# Patient Record
Sex: Female | Born: 1960 | Race: White | Hispanic: No | Marital: Single | Smoking: Never smoker
Health system: Southern US, Community
[De-identification: ages and names within clinical notes are randomized; demographics above are authoritative.]

## PROBLEM LIST (undated history)

## (undated) DIAGNOSIS — I10 Essential (primary) hypertension: Secondary | ICD-10-CM

## (undated) DIAGNOSIS — M1990 Unspecified osteoarthritis, unspecified site: Secondary | ICD-10-CM

## (undated) DIAGNOSIS — Z87442 Personal history of urinary calculi: Secondary | ICD-10-CM

## (undated) DIAGNOSIS — D649 Anemia, unspecified: Secondary | ICD-10-CM

## (undated) DIAGNOSIS — R319 Hematuria, unspecified: Secondary | ICD-10-CM

## (undated) DIAGNOSIS — E119 Type 2 diabetes mellitus without complications: Secondary | ICD-10-CM

## (undated) DIAGNOSIS — K219 Gastro-esophageal reflux disease without esophagitis: Secondary | ICD-10-CM

## (undated) DIAGNOSIS — Z8489 Family history of other specified conditions: Secondary | ICD-10-CM

## (undated) DIAGNOSIS — E785 Hyperlipidemia, unspecified: Secondary | ICD-10-CM

## (undated) HISTORY — DX: Essential (primary) hypertension: I10

## (undated) HISTORY — DX: Hematuria, unspecified: R31.9

## (undated) HISTORY — DX: Hyperlipidemia, unspecified: E78.5

## (undated) HISTORY — DX: Unspecified osteoarthritis, unspecified site: M19.90

## (undated) HISTORY — PX: TONSILLECTOMY: SUR1361

## (undated) HISTORY — DX: Type 2 diabetes mellitus without complications: E11.9

---

## 2005-03-13 HISTORY — PX: KIDNEY STONE SURGERY: SHX686

## 2011-02-11 LAB — HM COLONOSCOPY: HM COLON: NORMAL

## 2011-02-19 ENCOUNTER — Ambulatory Visit: Payer: Self-pay | Admitting: Internal Medicine

## 2011-05-13 ENCOUNTER — Ambulatory Visit: Payer: Self-pay | Admitting: Internal Medicine

## 2011-05-26 ENCOUNTER — Ambulatory Visit: Payer: Self-pay | Admitting: Internal Medicine

## 2011-05-28 ENCOUNTER — Ambulatory Visit: Payer: Self-pay | Admitting: Internal Medicine

## 2011-05-29 DIAGNOSIS — Z9889 Other specified postprocedural states: Secondary | ICD-10-CM | POA: Insufficient documentation

## 2011-06-11 ENCOUNTER — Ambulatory Visit: Payer: Self-pay | Admitting: Internal Medicine

## 2011-07-12 ENCOUNTER — Ambulatory Visit: Payer: Self-pay | Admitting: Internal Medicine

## 2011-08-11 HISTORY — PX: PARATHYROIDECTOMY: SHX19

## 2011-09-12 DIAGNOSIS — R748 Abnormal levels of other serum enzymes: Secondary | ICD-10-CM | POA: Insufficient documentation

## 2012-01-11 LAB — HM DIABETES EYE EXAM

## 2012-03-04 ENCOUNTER — Ambulatory Visit: Payer: Self-pay | Admitting: Internal Medicine

## 2013-05-18 LAB — HM PAP SMEAR: HM PAP: NEGATIVE

## 2013-05-18 LAB — LIPID PANEL
Cholesterol: 188 mg/dL (ref 0–200)
HDL: 66 mg/dL (ref 35–70)
LDL Cholesterol: 96 mg/dL
Triglycerides: 129 mg/dL (ref 40–160)

## 2013-06-14 LAB — HM MAMMOGRAPHY: HM MAMMO: NORMAL

## 2013-06-21 ENCOUNTER — Ambulatory Visit: Payer: Self-pay | Admitting: Internal Medicine

## 2013-11-08 LAB — HEMOGLOBIN A1C: Hgb A1c MFr Bld: 6.7 % — AB (ref 4.0–6.0)

## 2014-06-06 ENCOUNTER — Encounter: Payer: Self-pay | Admitting: Internal Medicine

## 2014-06-06 DIAGNOSIS — R809 Proteinuria, unspecified: Secondary | ICD-10-CM

## 2014-06-06 DIAGNOSIS — E785 Hyperlipidemia, unspecified: Secondary | ICD-10-CM | POA: Insufficient documentation

## 2014-06-06 DIAGNOSIS — I10 Essential (primary) hypertension: Secondary | ICD-10-CM | POA: Insufficient documentation

## 2014-06-06 DIAGNOSIS — E1169 Type 2 diabetes mellitus with other specified complication: Secondary | ICD-10-CM | POA: Insufficient documentation

## 2014-06-06 DIAGNOSIS — E1129 Type 2 diabetes mellitus with other diabetic kidney complication: Secondary | ICD-10-CM | POA: Insufficient documentation

## 2014-06-06 DIAGNOSIS — E118 Type 2 diabetes mellitus with unspecified complications: Secondary | ICD-10-CM | POA: Insufficient documentation

## 2014-08-15 ENCOUNTER — Ambulatory Visit (INDEPENDENT_AMBULATORY_CARE_PROVIDER_SITE_OTHER): Payer: BC Managed Care – PPO | Admitting: Internal Medicine

## 2014-08-15 ENCOUNTER — Encounter: Payer: Self-pay | Admitting: Internal Medicine

## 2014-08-15 VITALS — BP 110/62 | HR 72 | Ht 60.0 in | Wt 212.2 lb

## 2014-08-15 DIAGNOSIS — E119 Type 2 diabetes mellitus without complications: Secondary | ICD-10-CM

## 2014-08-15 DIAGNOSIS — Z1239 Encounter for other screening for malignant neoplasm of breast: Secondary | ICD-10-CM | POA: Diagnosis not present

## 2014-08-15 DIAGNOSIS — E782 Mixed hyperlipidemia: Secondary | ICD-10-CM

## 2014-08-15 DIAGNOSIS — Z0000 Encounter for general adult medical examination without abnormal findings: Secondary | ICD-10-CM | POA: Diagnosis not present

## 2014-08-15 DIAGNOSIS — M62830 Muscle spasm of back: Secondary | ICD-10-CM | POA: Diagnosis not present

## 2014-08-15 DIAGNOSIS — I10 Essential (primary) hypertension: Secondary | ICD-10-CM

## 2014-08-15 DIAGNOSIS — E1129 Type 2 diabetes mellitus with other diabetic kidney complication: Secondary | ICD-10-CM

## 2014-08-15 DIAGNOSIS — R809 Proteinuria, unspecified: Secondary | ICD-10-CM

## 2014-08-15 LAB — POC URINALYSIS WITH MICROSCOPIC (NON AUTO)MANUAL RESULT
Bacteria, UA: 0
Bilirubin, UA: NEGATIVE
Crystals: 0
Epithelial cells, urine per micros: 0
Glucose, UA: NEGATIVE
KETONES UA: NEGATIVE
MUCUS UA: 0
NITRITE UA: NEGATIVE
PH UA: 7
PROTEIN UA: NEGATIVE
RBC: 2 M/uL — AB (ref 4.04–5.48)
Spec Grav, UA: 1.01
UROBILINOGEN UA: 0.2
WBC CASTS UA: 2

## 2014-08-15 MED ORDER — METHOCARBAMOL 500 MG PO TABS: 500.0000 mg | ORAL_TABLET | Freq: Four times a day (QID) | ORAL | Status: DC

## 2014-08-15 MED ORDER — GLUCOSE BLOOD VI STRP: ORAL_STRIP | Status: DC

## 2014-08-15 NOTE — Progress Notes (Signed)
Date:  08/15/2014   Name:  Brittany Forbes   DOB:  November 23, 1960   MRN:  [id number]   Chief Complaint: Annual Exam; Diabetes; Hypertension; and Hyperlipidemia Brittany Forbes is a 54 y.o. female who presents today for her Complete Annual Exam. She feels fairly well. She reports exercising 3-4 times per week. She reports she is sleeping well. She performs a self breast exam intermittently.  Diabetes She presents for her follow-up diabetic visit. She has type 2 diabetes mellitus. Her disease course has been worsening. Pertinent negatives for hypoglycemia include no headaches. Pertinent negatives for diabetes include no chest pain, no polydipsia and no polyuria. Current diabetic treatment includes oral agent (monotherapy). She is compliant with treatment all of the time. Her breakfast blood glucose is taken between 6-7 am. Her breakfast blood glucose range is generally 110-130 mg/dl. An ACE inhibitor/angiotensin II receptor blocker is being taken. Eye exam is current.  Hypertension This is a chronic problem. The current episode started more than 1 year ago. The problem is unchanged. The problem is controlled. Pertinent negatives include no chest pain, headaches, palpitations, peripheral edema or shortness of breath. Risk factors for coronary artery disease include diabetes mellitus and dyslipidemia. Past treatments include angiotensin blockers. The current treatment provides significant improvement. There are no compliance problems.   Hyperlipidemia This is a chronic problem. The problem is controlled. Exacerbating diseases include diabetes. Pertinent negatives include no chest pain, myalgias or shortness of breath. Current antihyperlipidemic treatment includes statins. The current treatment provides significant improvement of lipids. There are no compliance problems.   Back Pain This is a new problem. The current episode started 1 to 4 weeks ago. The problem occurs intermittently. The pain is present in the  lumbar spine. The quality of the pain is described as aching and cramping. The pain does not radiate. The symptoms are aggravated by twisting and bending. Pertinent negatives include no abdominal pain, chest pain, dysuria, fever or headaches. Risk factors: worse since moving house. She has tried heat and NSAIDs for the symptoms. The treatment provided mild relief.     Review of Systems:  Review of Systems  Constitutional: Negative for fever, appetite change and unexpected weight change.  HENT: Negative for hearing loss, sore throat and trouble swallowing.   Eyes: Negative for visual disturbance.  Respiratory: Negative for cough, chest tightness and shortness of breath.   Cardiovascular: Negative for chest pain, palpitations and leg swelling.  Gastrointestinal: Negative for abdominal pain and blood in stool.  Endocrine: Negative for polydipsia and polyuria.  Genitourinary: Negative for dysuria, urgency, frequency and vaginal bleeding.  Musculoskeletal: Positive for back pain. Negative for myalgias, joint swelling, arthralgias and gait problem.  Neurological: Negative for headaches.  Psychiatric/Behavioral: Negative for sleep disturbance and dysphoric mood.    Patient Active Problem List   Diagnosis Date Noted  . Well controlled diabetes mellitus 06/06/2014  . Essential (primary) hypertension 06/06/2014  . Combined fat and carbohydrate induced hyperlipemia 06/06/2014  . Persistent proteinuria associated with type 2 diabetes mellitus 06/06/2014  . Abnormal liver enzymes 09/12/2011  . Parathyroid adenoma 05/29/2011    Prior to Admission medications   Medication Sig Start Date End Date Taking? Authorizing Provider  atorvastatin (LIPITOR) 10 MG tablet Take 1 tablet by mouth daily. 03/31/14  Yes Historical Provider, MD  GLUCOS-MSM-C-MN-GINGER-WILLOW PO Take 1 tablet by mouth daily.   Yes Historical Provider, MD  losartan (COZAAR) 50 MG tablet Take 1 tablet by mouth daily. 06/01/14  Yes  Historical Provider, MD  metFORMIN (GLUCOPHAGE)  500 MG tablet Take 1 tablet by mouth 2 (two) times daily. 03/31/14  Yes Historical Provider, MD  MULTIPLE VITAMIN PO Take 1 tablet by mouth daily.   Yes Historical Provider, MD  [facility] DELICA LANCETS 57S MISC Use 1 each 2 (two) times daily. Extra fine 12/01/11  Yes Historical Provider, MD  glucose blood (ACCU-CHEK COMFORT CURVE) test strip Use 1 each 2 (two) times daily. Use as instructed. 06/19/11   Historical Provider, MD    Allergies  Allergen Reactions  . Ace Inhibitors     Other reaction(s): Cough    Past Surgical History  Procedure Laterality Date  . Parathyroidectomy  [id number]    [facility]  . Kidney stone surgery Left 020107  . Tonsillectomy      History  Substance Use Topics  . Smoking status: Never Smoker   . Smokeless tobacco: Not on file  . Alcohol Use: 0.0 oz/week    0 Standard drinks or equivalent per week     Medication list has been reviewed and updated.  Physical Examination:  Physical Exam  Constitutional: She is oriented to person, place, and time. She appears well-developed and well-nourished.  HENT:  Right Ear: Tympanic membrane normal.  Left Ear: Tympanic membrane normal.  Nose: Nose normal. Right sinus exhibits no maxillary sinus tenderness. Left sinus exhibits no maxillary sinus tenderness.  Mouth/Throat: Uvula is midline.  Eyes: Conjunctivae and lids are normal.  Neck: Normal range of motion. Neck supple. No tracheal tenderness present. Carotid bruit is not present. No thyromegaly present.  Cardiovascular: Normal rate, regular rhythm and normal heart sounds.   Pulmonary/Chest: Breath sounds normal. She has no decreased breath sounds. She has no wheezes.  Abdominal: Soft. Normal appearance and bowel sounds are normal. There is no hepatosplenomegaly. There is no tenderness.  Genitourinary: No breast swelling, tenderness, discharge or bleeding.  Musculoskeletal:       Lumbar back: She exhibits tenderness and  spasm.  Lymphadenopathy:    She has no cervical adenopathy.    She has no axillary adenopathy.  Neurological: She is alert and oriented to person, place, and time. She has normal reflexes. No sensory deficit.  Skin: Skin is warm, dry and intact.  Psychiatric: She has a normal mood and affect. Cognition and memory are normal.   Lab Results  Component Value Date   HGBA1C 6.7* 11/08/2013     BP 110/62 mmHg  Pulse 72  Ht 5' (1.524 m)  Wt 212 lb 3.2 oz (96.253 kg)  BMI 41.44 kg/m2  Assessment and Plan: 1. Annual physical exam UA positive but microscopic negative - no treatment needed - POC urinalysis w microscopic (non auto)  2. Essential (primary) hypertension controlled - CBC with Differential/Platelet - TSH  3. Persistent proteinuria associated with type 2 diabetes mellitus Continue current therapy - Microalbumin / creatinine urine ratio  4. Well controlled diabetes mellitus Continue current therapy - will advise if dose adjustment needed - Comprehensive metabolic panel - Hemoglobin A1c  5. Combined fat and carbohydrate induced hyperlipemia On appropriate statin therapy - Lipid panel  6. Muscle spasm of back Continue heat and advil Add muscle relaxant to use as needed - methocarbamol (ROBAXIN) 500 MG tablet; Take 1 tablet (500 mg total) by mouth 4 (four) times daily.  Dispense: 60 tablet; Refill: 1  7. Breast cancer screening - MM DIGITAL SCREENING BILATERAL; Future   Halina Maidens, MD [facility]  08/15/2014

## 2014-08-15 NOTE — Patient Instructions (Signed)

## 2014-08-16 ENCOUNTER — Ambulatory Visit
Admission: RE | Admit: 2014-08-16 | Discharge: 2014-08-16 | Disposition: A | Discharge status: Home / Self Care | Payer: BC Managed Care – PPO | Source: Ambulatory Visit | Attending: Internal Medicine | Admitting: Internal Medicine

## 2014-08-16 DIAGNOSIS — Z1231 Encounter for screening mammogram for malignant neoplasm of breast: Secondary | ICD-10-CM | POA: Insufficient documentation

## 2014-08-16 DIAGNOSIS — Z1239 Encounter for other screening for malignant neoplasm of breast: Secondary | ICD-10-CM

## 2014-08-16 LAB — CBC WITH DIFFERENTIAL/PLATELET
BASOS ABS: 0 10*3/uL (ref 0.0–0.2)
Basos: 0 %
EOS (ABSOLUTE): 0.2 10*3/uL (ref 0.0–0.4)
Eos: 2 %
Hematocrit: 41 % (ref 34.0–46.6)
Hemoglobin: 13.7 g/dL (ref 11.1–15.9)
IMMATURE GRANS (ABS): 0 10*3/uL (ref 0.0–0.1)
IMMATURE GRANULOCYTES: 0 %
Lymphocytes Absolute: 3 10*3/uL (ref 0.7–3.1)
Lymphs: 38 %
MCH: 27.9 pg (ref 26.6–33.0)
MCHC: 33.4 g/dL (ref 31.5–35.7)
MCV: 84 fL (ref 79–97)
MONOS ABS: 0.4 10*3/uL (ref 0.1–0.9)
Monocytes: 5 %
NEUTROS PCT: 55 %
Neutrophils Absolute: 4.3 10*3/uL (ref 1.4–7.0)
PLATELETS: 331 10*3/uL (ref 150–379)
RBC: 4.91 x10E6/uL (ref 3.77–5.28)
RDW: 14.5 % (ref 12.3–15.4)
WBC: 8 10*3/uL (ref 3.4–10.8)

## 2014-08-16 LAB — LIPID PANEL
Chol/HDL Ratio: 2.7 ratio units (ref 0.0–4.4)
Cholesterol, Total: 173 mg/dL (ref 100–199)
HDL: 63 mg/dL (ref >39)
LDL Calculated: 81 mg/dL (ref 0–99)
TRIGLYCERIDES: 147 mg/dL (ref 0–149)
VLDL Cholesterol Cal: 29 mg/dL (ref 5–40)

## 2014-08-16 LAB — COMPREHENSIVE METABOLIC PANEL
ALK PHOS: 103 IU/L (ref 39–117)
ALT: 26 IU/L (ref 0–32)
AST: 21 IU/L (ref 0–40)
Albumin/Globulin Ratio: 1.8 (ref 1.1–2.5)
Albumin: 4.7 g/dL (ref 3.5–5.5)
BUN/Creatinine Ratio: 23 (ref 9–23)
BUN: 14 mg/dL (ref 6–24)
Bilirubin Total: 0.3 mg/dL (ref 0.0–1.2)
CO2: 22 mmol/L (ref 18–29)
Calcium: 10 mg/dL (ref 8.7–10.2)
Chloride: 98 mmol/L (ref 97–108)
Creatinine, Ser: 0.62 mg/dL (ref 0.57–1.00)
GFR calc Af Amer: 117 mL/min/{1.73_m2} (ref >59)
GFR, EST NON AFRICAN AMERICAN: 101 mL/min/{1.73_m2} (ref >59)
Globulin, Total: 2.6 g/dL (ref 1.5–4.5)
Glucose: 105 mg/dL — ABNORMAL HIGH (ref 65–99)
Potassium: 4.2 mmol/L (ref 3.5–5.2)
Sodium: 139 mmol/L (ref 134–144)
Total Protein: 7.3 g/dL (ref 6.0–8.5)

## 2014-08-16 LAB — TSH: TSH: 1.6 u[IU]/mL (ref 0.450–4.500)

## 2014-08-16 LAB — HEMOGLOBIN A1C: HEMOGLOBIN A1C: 6.7 % — AB (ref 4.8–5.6)

## 2014-08-18 NOTE — Progress Notes (Signed)
This encounter was created in error - please disregard.

## 2014-09-25 ENCOUNTER — Other Ambulatory Visit: Payer: Self-pay | Admitting: Internal Medicine

## 2014-12-18 ENCOUNTER — Ambulatory Visit: Payer: BC Managed Care – PPO | Admitting: Internal Medicine

## 2014-12-20 ENCOUNTER — Other Ambulatory Visit: Payer: Self-pay

## 2014-12-20 ENCOUNTER — Encounter: Payer: Self-pay | Admitting: Internal Medicine

## 2014-12-20 ENCOUNTER — Ambulatory Visit (INDEPENDENT_AMBULATORY_CARE_PROVIDER_SITE_OTHER): Payer: BC Managed Care – PPO | Admitting: Internal Medicine

## 2014-12-20 VITALS — BP 122/80 | HR 96 | Temp 98.4°F | Ht 60.0 in | Wt 219.6 lb

## 2014-12-20 DIAGNOSIS — Z23 Encounter for immunization: Secondary | ICD-10-CM | POA: Diagnosis not present

## 2014-12-20 DIAGNOSIS — E119 Type 2 diabetes mellitus without complications: Secondary | ICD-10-CM | POA: Diagnosis not present

## 2014-12-20 DIAGNOSIS — E1129 Type 2 diabetes mellitus with other diabetic kidney complication: Secondary | ICD-10-CM

## 2014-12-20 DIAGNOSIS — R809 Proteinuria, unspecified: Secondary | ICD-10-CM

## 2014-12-20 DIAGNOSIS — I10 Essential (primary) hypertension: Secondary | ICD-10-CM

## 2014-12-20 NOTE — Progress Notes (Signed)
Date:  12/20/2014   Name:  Brittany Forbes   DOB:  26-May-1960   MRN:  [id number]   Chief Complaint: Diabetes Diabetes She presents for her follow-up diabetic visit. She has type 2 diabetes mellitus. Her disease course has been stable (BS continue to run higher in the AM). Pertinent negatives for hypoglycemia include no dizziness. Pertinent negatives for diabetes include no chest pain, no fatigue, no polydipsia and no polyuria. There are no hypoglycemic complications. Symptoms are stable. Risk factors for coronary artery disease include diabetes mellitus. She is compliant with treatment most of the time. She participates in exercise weekly. Her breakfast blood glucose is taken between 7-8 am. Her breakfast blood glucose range is generally 130-140 mg/dl. An ACE inhibitor/angiotensin II receptor blocker is being taken.  URI  This is a new problem. The current episode started in the past 7 days. The problem has been unchanged. There has been no fever. Associated symptoms include congestion. Pertinent negatives include no abdominal pain, chest pain, coughing, dysuria, plugged ear sensation, rhinorrhea, sore throat or swollen glands. She has tried acetaminophen for the symptoms.    Review of Systems  Constitutional: Negative for chills and fatigue.  HENT: Positive for congestion. Negative for hearing loss, rhinorrhea and sore throat.   Eyes: Negative for visual disturbance.  Respiratory: Negative for cough, choking and chest tightness.   Cardiovascular: Negative for chest pain and palpitations.  Gastrointestinal: Negative for abdominal pain.  Endocrine: Negative for polydipsia and polyuria.  Genitourinary: Negative for dysuria and urgency.  Neurological: Negative for dizziness and light-headedness.    Patient Active Problem List   Diagnosis Date Noted  . Well controlled diabetes mellitus ([place]) 06/06/2014  . Essential (primary) hypertension 06/06/2014  . Combined fat and carbohydrate induced  hyperlipemia 06/06/2014  . Persistent proteinuria associated with type 2 diabetes mellitus ([place]) 06/06/2014  . Abnormal liver enzymes 09/12/2011  . Parathyroid adenoma 05/29/2011    Prior to Admission medications   Medication Sig Start Date End Date Taking? Authorizing Provider  atorvastatin (LIPITOR) 10 MG tablet TAKE 1 TABLET DAILY 09/25/14  Yes Brittany Hess, MD  GLUCOS-MSM-C-MN-GINGER-WILLOW PO Take 1 tablet by mouth daily.   Yes Historical Provider, MD  glucose blood (ACCU-CHEK COMFORT CURVE) test strip Use 1 each 2 (two) times daily. Use as instructed. 06/19/11  Yes Historical Provider, MD  glucose blood (ONE TOUCH ULTRA TEST) test strip Use as instructed 08/15/14  Yes Brittany Hess, MD  losartan (COZAAR) 50 MG tablet Take 1 tablet by mouth daily. 06/01/14  Yes Historical Provider, MD  metFORMIN (GLUCOPHAGE) 500 MG tablet TAKE 1 TABLET TWICE A DAY 09/25/14  Yes Brittany Hess, MD  methocarbamol (ROBAXIN) 500 MG tablet Take 1 tablet (500 mg total) by mouth 4 (four) times daily. 08/15/14  Yes Brittany Hess, MD  MULTIPLE VITAMIN PO Take 1 tablet by mouth daily.   Yes Historical Provider, MD  [facility] DELICA LANCETS 82N MISC Use 1 each 2 (two) times daily. Extra fine 12/01/11  Yes Historical Provider, MD  tobramycin (TOBREX) 0.3 % ophthalmic solution ADMINISTER 1-2 DROPS INTO THE LEFT EYE EVERY 4 (FOUR) HOURS FOR 7 DAYS. AVOID IN LACTATION. 10/18/14  Yes Historical Provider, MD    Allergies  Allergen Reactions  . Ace Inhibitors     Other reaction(s): Cough    Past Surgical History  Procedure Laterality Date  . Parathyroidectomy  [id number]    [facility]  . Kidney stone surgery Left 020107  . Tonsillectomy  Social History  Substance Use Topics  . Smoking status: Never Smoker   . Smokeless tobacco: None  . Alcohol Use: 0.0 oz/week    0 Standard drinks or equivalent per week     Medication list has been reviewed and updated.   Physical Exam  Constitutional: She is oriented to  person, place, and time. She appears well-developed and well-nourished. No distress.  HENT:  Head: Normocephalic and atraumatic.  Right Ear: Tympanic membrane and ear canal normal.  Left Ear: Tympanic membrane and ear canal normal.  Nose: Right sinus exhibits no maxillary sinus tenderness and no frontal sinus tenderness. Left sinus exhibits no maxillary sinus tenderness and no frontal sinus tenderness.  Eyes: Conjunctivae are normal. Right eye exhibits no discharge. Left eye exhibits no discharge. No scleral icterus.  Neck: Normal range of motion. Neck supple. No thyromegaly present.  Cardiovascular: Normal rate, regular rhythm and normal heart sounds.   Pulmonary/Chest: Effort normal and breath sounds normal. No respiratory distress.  Musculoskeletal: Normal range of motion.  Neurological: She is alert and oriented to person, place, and time.  Skin: Skin is warm and dry. No rash noted.  Psychiatric: She has a normal mood and affect. Her behavior is normal. Thought content normal.  Nursing note and vitals reviewed.   BP 122/80 mmHg  Pulse 96  Ht 5' (1.524 m)  Wt 219 lb 9.6 oz (99.61 kg)  BMI 42.89 kg/m2  Assessment and Plan: 1. Persistent proteinuria associated with type 2 diabetes mellitus ([place]) Controlled - continue current medication unless A1C is worse  2. Well controlled diabetes mellitus ([place]) - Hemoglobin A1c  3. Essential (primary) hypertension Controlled  4. Need for influenza vaccination - Flu Vaccine QUAD 36+ mos IM   Brittany Maidens, MD [facility]  12/20/2014

## 2014-12-21 LAB — HEMOGLOBIN A1C
ESTIMATED AVERAGE GLUCOSE: 146 mg/dL
HEMOGLOBIN A1C: 6.7 % — AB (ref 4.8–5.6)

## 2015-02-21 ENCOUNTER — Other Ambulatory Visit: Payer: Self-pay

## 2015-02-21 MED ORDER — LOSARTAN POTASSIUM 50 MG PO TABS: 50.0000 mg | ORAL_TABLET | Freq: Every day | ORAL | Status: DC

## 2015-02-21 MED ORDER — ATORVASTATIN CALCIUM 10 MG PO TABS: 10.0000 mg | ORAL_TABLET | Freq: Every day | ORAL | Status: DC

## 2015-02-21 NOTE — Telephone Encounter (Signed)
Patient has new mail order pharmacy and would like these medications sent in. Updated mail order pharmacy.

## 2015-02-22 ENCOUNTER — Other Ambulatory Visit: Payer: Self-pay | Admitting: Internal Medicine

## 2015-02-22 MED ORDER — METFORMIN HCL 500 MG PO TABS: 500.0000 mg | ORAL_TABLET | Freq: Two times a day (BID) | ORAL | Status: DC

## 2015-03-22 ENCOUNTER — Other Ambulatory Visit: Payer: Self-pay | Admitting: Internal Medicine

## 2015-04-19 ENCOUNTER — Encounter: Payer: Self-pay | Admitting: Internal Medicine

## 2015-04-20 ENCOUNTER — Encounter: Payer: Self-pay | Admitting: Internal Medicine

## 2015-04-20 ENCOUNTER — Ambulatory Visit (INDEPENDENT_AMBULATORY_CARE_PROVIDER_SITE_OTHER): Payer: BC Managed Care – PPO | Admitting: Internal Medicine

## 2015-04-20 VITALS — BP 118/70 | HR 88 | Ht 60.0 in | Wt 218.4 lb

## 2015-04-20 DIAGNOSIS — R809 Proteinuria, unspecified: Secondary | ICD-10-CM | POA: Diagnosis not present

## 2015-04-20 DIAGNOSIS — I10 Essential (primary) hypertension: Secondary | ICD-10-CM | POA: Diagnosis not present

## 2015-04-20 DIAGNOSIS — E119 Type 2 diabetes mellitus without complications: Secondary | ICD-10-CM | POA: Diagnosis not present

## 2015-04-20 DIAGNOSIS — IMO0001 Reserved for inherently not codable concepts without codable children: Secondary | ICD-10-CM

## 2015-04-20 NOTE — Progress Notes (Signed)
Date:  04/20/2015   Name:  Brittany Forbes   DOB:  1960/09/05   MRN:  [id number]   Chief Complaint: Follow-up; Hypertension; and Diabetes Hypertension This is a chronic problem. The current episode started more than 1 year ago. The problem is unchanged. The problem is controlled. Pertinent negatives include no chest pain, headaches, palpitations or shortness of breath.  Diabetes She presents for her follow-up diabetic visit. She has type 2 diabetes mellitus. Her disease course has been stable. Pertinent negatives for hypoglycemia include no headaches or tremors. Pertinent negatives for diabetes include no chest pain, no fatigue, no polydipsia and no polyuria. Her breakfast blood glucose is taken between 6-7 am. Her breakfast blood glucose range is generally 130-140 mg/dl.   Lab Results  Component Value Date   HGBA1C 6.7* 12/20/2014   Wt Readings from Last 3 Encounters:  04/20/15 218 lb 6.4 oz (99.066 kg)  12/20/14 219 lb 9.6 oz (99.61 kg)  08/15/14 212 lb 3.2 oz (96.253 kg)   Temp Readings from Last 3 Encounters:  12/20/14 98.4 F (36.9 C)    BP Readings from Last 3 Encounters:  04/20/15 118/70  12/20/14 122/80  08/15/14 110/62   Pulse Readings from Last 3 Encounters:  04/20/15 88  12/20/14 96  08/15/14 72      Review of Systems  Constitutional: Negative for fever, appetite change, fatigue and unexpected weight change.  HENT: Negative for tinnitus and trouble swallowing.   Eyes: Negative for visual disturbance.  Respiratory: Negative for cough, chest tightness and shortness of breath.   Cardiovascular: Negative for chest pain, palpitations and leg swelling.  Gastrointestinal: Negative for abdominal pain.  Endocrine: Negative for polydipsia and polyuria.  Genitourinary: Negative for dysuria and hematuria.  Musculoskeletal: Negative for arthralgias.  Neurological: Negative for tremors, numbness and headaches.  Psychiatric/Behavioral: Negative for dysphoric mood.     Patient Active Problem List   Diagnosis Date Noted  . Controlled type 2 DM with microalbuminuria or microproteinuria 06/06/2014  . Essential (primary) hypertension 06/06/2014  . Hyperlipidemia associated with type 2 diabetes mellitus ([place]) 06/06/2014  . Persistent proteinuria associated with type 2 diabetes mellitus ([place]) 06/06/2014  . Abnormal liver enzymes 09/12/2011  . Parathyroid adenoma 05/29/2011    Prior to Admission medications   Medication Sig Start Date End Date Taking? Authorizing Provider  atorvastatin (LIPITOR) 10 MG tablet TAKE 1 TABLET DAILY 03/22/15  Yes Glean Hess, MD  glucose blood (ACCU-CHEK COMFORT CURVE) test strip Use 1 each 2 (two) times daily. Use as instructed. 06/19/11  Yes Historical Provider, MD  glucose blood (ONE TOUCH ULTRA TEST) test strip Use as instructed 08/15/14  Yes Glean Hess, MD  losartan (COZAAR) 50 MG tablet Take 1 tablet (50 mg total) by mouth daily. 02/21/15  Yes Glean Hess, MD  metFORMIN (GLUCOPHAGE) 500 MG tablet TAKE 1 TABLET TWICE A DAY 03/22/15  Yes Glean Hess, MD  methocarbamol (ROBAXIN) 500 MG tablet Take 1 tablet (500 mg total) by mouth 4 (four) times daily. 08/15/14  Yes Glean Hess, MD  MULTIPLE VITAMIN PO Take 1 tablet by mouth daily.   Yes Historical Provider, MD  [facility] DELICA LANCETS [id number] MISC Use 1 each 2 (two) times daily. Extra fine 12/01/11  Yes Historical Provider, MD    Allergies  Allergen Reactions  . Ace Inhibitors     Other reaction(s): Cough    Past Surgical History  Procedure Laterality Date  . Parathyroidectomy  [id number]    [facility]  .  Kidney stone surgery Left 020107  . Tonsillectomy      Social History  Substance Use Topics  . Smoking status: Never Smoker   . Smokeless tobacco: None  . Alcohol Use: 0.0 oz/week    0 Standard drinks or equivalent per week     Medication list has been reviewed and updated.   Physical Exam  Constitutional: She is oriented to person, place, and time.  She appears well-developed. No distress.  HENT:  Head: Normocephalic and atraumatic.  Pulmonary/Chest: Effort normal. No respiratory distress.  Musculoskeletal: Normal range of motion.  Neurological: She is alert and oriented to person, place, and time.  Skin: Skin is warm and dry. No rash noted.  Psychiatric: She has a normal mood and affect. Her behavior is normal. Thought content normal.    BP 118/70 mmHg  Pulse 88  Ht 5' (1.524 m)  Wt 218 lb 6.4 oz (99.066 kg)  BMI 42.65 kg/m2  Assessment and Plan: 1. Controlled type 2 DM with microalbuminuria or microproteinuria Continue medications and exercise Work on diet - Hemoglobin A1c  2. Essential (primary) hypertension controlled   Brittany Maidens, MD [facility]  04/20/2015

## 2015-04-21 LAB — HEMOGLOBIN A1C
Est. average glucose Bld gHb Est-mCnc: 151 mg/dL
Hgb A1c MFr Bld: 6.9 % — ABNORMAL HIGH (ref 4.8–5.6)

## 2015-04-23 ENCOUNTER — Telehealth: Payer: Self-pay

## 2015-04-23 NOTE — Telephone Encounter (Signed)
Left message for patient to call back  

## 2015-04-23 NOTE — Telephone Encounter (Signed)
-----   Message from Glean Hess, MD sent at 04/21/2015  9:53 PM EST ----- DM is good but slightly higher than previously.  Continue same medication, work more on diet.

## 2015-04-25 NOTE — Telephone Encounter (Signed)
Spoke with patient. Patient advised of all results and verbalized understanding. Will call back with any future questions or concerns. MAH  

## 2015-08-17 ENCOUNTER — Encounter: Payer: Self-pay | Admitting: Internal Medicine

## 2015-08-17 ENCOUNTER — Ambulatory Visit (INDEPENDENT_AMBULATORY_CARE_PROVIDER_SITE_OTHER): Payer: BC Managed Care – PPO | Admitting: Internal Medicine

## 2015-08-17 ENCOUNTER — Other Ambulatory Visit: Payer: Self-pay | Admitting: Internal Medicine

## 2015-08-17 VITALS — BP 118/74 | HR 89 | Resp 16 | Ht 60.0 in | Wt 216.2 lb

## 2015-08-17 DIAGNOSIS — Z1239 Encounter for other screening for malignant neoplasm of breast: Secondary | ICD-10-CM | POA: Diagnosis not present

## 2015-08-17 DIAGNOSIS — E119 Type 2 diabetes mellitus without complications: Secondary | ICD-10-CM | POA: Diagnosis not present

## 2015-08-17 DIAGNOSIS — I10 Essential (primary) hypertension: Secondary | ICD-10-CM

## 2015-08-17 DIAGNOSIS — Z1159 Encounter for screening for other viral diseases: Secondary | ICD-10-CM | POA: Diagnosis not present

## 2015-08-17 DIAGNOSIS — E1169 Type 2 diabetes mellitus with other specified complication: Secondary | ICD-10-CM

## 2015-08-17 DIAGNOSIS — Z0000 Encounter for general adult medical examination without abnormal findings: Secondary | ICD-10-CM

## 2015-08-17 DIAGNOSIS — E785 Hyperlipidemia, unspecified: Secondary | ICD-10-CM | POA: Diagnosis not present

## 2015-08-17 DIAGNOSIS — R809 Proteinuria, unspecified: Secondary | ICD-10-CM

## 2015-08-17 DIAGNOSIS — IMO0001 Reserved for inherently not codable concepts without codable children: Secondary | ICD-10-CM

## 2015-08-17 LAB — POCT URINALYSIS DIPSTICK
Bilirubin, UA: NEGATIVE
Glucose, UA: NEGATIVE
Ketones, UA: NEGATIVE
Leukocytes, UA: NEGATIVE
Nitrite, UA: NEGATIVE
PROTEIN UA: NEGATIVE
RBC UA: NEGATIVE
SPEC GRAV UA: 1.01
pH, UA: 7.5

## 2015-08-17 MED ORDER — LOSARTAN POTASSIUM 50 MG PO TABS: 50.0000 mg | ORAL_TABLET | Freq: Every day | ORAL | Status: DC

## 2015-08-17 MED ORDER — ATORVASTATIN CALCIUM 10 MG PO TABS: 10.0000 mg | ORAL_TABLET | Freq: Every day | ORAL | Status: DC

## 2015-08-17 MED ORDER — METFORMIN HCL 500 MG PO TABS: 500.0000 mg | ORAL_TABLET | Freq: Two times a day (BID) | ORAL | Status: DC

## 2015-08-17 NOTE — Progress Notes (Signed)
Date:  08/17/2015   Name:  Brittany Forbes   DOB:  23-Aug-1960   MRN:  [id number]   Chief Complaint: Annual Exam Brittany Forbes is a 55 y.o. female who presents today for her Complete Annual Exam. She feels well. She reports exercising 2-3 times per week. She reports she is sleeping fairly well.   Diabetes She presents for her follow-up diabetic visit. She has type 2 diabetes mellitus. Her disease course has been stable. There are no hypoglycemic associated symptoms. Pertinent negatives for hypoglycemia include no dizziness, headaches, nervousness/anxiousness or tremors. Pertinent negatives for diabetes include no chest pain, no fatigue, no polydipsia and no polyuria. Symptoms are stable. Pertinent negatives for diabetic complications include no nephropathy or retinopathy.  Hyperlipidemia This is a chronic problem. The current episode started more than 1 year ago. The problem is controlled. Recent lipid tests were reviewed and are normal. Pertinent negatives include no chest pain or shortness of breath. Current antihyperlipidemic treatment includes statins.  Hypertension This is a chronic problem. Pertinent negatives include no chest pain, headaches, palpitations or shortness of breath. There is no history of retinopathy.    Lab Results  Component Value Date   HGBA1C 6.9* 04/20/2015     Review of Systems  Constitutional: Negative for fever, chills and fatigue.  HENT: Negative for congestion, hearing loss, tinnitus, trouble swallowing and voice change.   Eyes: Negative for visual disturbance.  Respiratory: Negative for cough, chest tightness, shortness of breath and wheezing.   Cardiovascular: Negative for chest pain, palpitations and leg swelling.  Gastrointestinal: Negative for vomiting, abdominal pain, diarrhea and constipation.  Endocrine: Negative for polydipsia and polyuria.  Genitourinary: Negative for dysuria, frequency, vaginal bleeding, vaginal discharge and genital sores.    Musculoskeletal: Negative for joint swelling, arthralgias and gait problem.  Skin: Negative for color change and rash.  Neurological: Negative for dizziness, tremors, light-headedness and headaches.  Hematological: Negative for adenopathy. Does not bruise/bleed easily.  Psychiatric/Behavioral: Negative for sleep disturbance and dysphoric mood. The patient is not nervous/anxious.     Patient Active Problem List   Diagnosis Date Noted  . Controlled type 2 DM with microalbuminuria or microproteinuria 06/06/2014  . Essential (primary) hypertension 06/06/2014  . Hyperlipidemia associated with type 2 diabetes mellitus ([place]) 06/06/2014  . Persistent proteinuria associated with type 2 diabetes mellitus ([place]) 06/06/2014  . Abnormal liver enzymes 09/12/2011  . Parathyroid adenoma 05/29/2011    Prior to Admission medications   Medication Sig Start Date End Date Taking? Authorizing Provider  atorvastatin (LIPITOR) 10 MG tablet TAKE 1 TABLET DAILY 03/22/15  Yes Glean Hess, MD  GLUCOSAMINE HCL-MSM PO Take by mouth.   Yes Historical Provider, MD  glucose blood (ONE TOUCH ULTRA TEST) test strip Use as instructed 08/15/14  Yes Glean Hess, MD  losartan (COZAAR) 50 MG tablet Take 1 tablet (50 mg total) by mouth daily. 02/21/15  Yes Glean Hess, MD  metFORMIN (GLUCOPHAGE) 500 MG tablet TAKE 1 TABLET TWICE A DAY 03/22/15  Yes Glean Hess, MD  MULTIPLE VITAMIN PO Take 1 tablet by mouth daily.   Yes Historical Provider, MD  [facility] DELICA LANCETS [id number] MISC Use 1 each 2 (two) times daily. Extra fine 12/01/11  Yes Historical Provider, MD  glucose blood (ACCU-CHEK COMFORT CURVE) test strip Use 1 each 2 (two) times daily. Use as instructed. 06/19/11   Historical Provider, MD    Allergies  Allergen Reactions  . Ace Inhibitors     Other reaction(s): Cough  Past Surgical History  Procedure Laterality Date  . Parathyroidectomy  [id number]    [facility]  . Kidney stone surgery Left [id number]  .  Tonsillectomy      Social History  Substance Use Topics  . Smoking status: Never Smoker   . Smokeless tobacco: None  . Alcohol Use: 0.0 oz/week    0 Standard drinks or equivalent per week     Medication list has been reviewed and updated.   Physical Exam  Constitutional: She is oriented to person, place, and time. She appears well-developed and well-nourished. No distress.  HENT:  Head: Normocephalic and atraumatic.  Right Ear: Tympanic membrane and ear canal normal.  Left Ear: Tympanic membrane and ear canal normal.  Nose: Right sinus exhibits no maxillary sinus tenderness. Left sinus exhibits no maxillary sinus tenderness.  Mouth/Throat: Uvula is midline and oropharynx is clear and moist.  Eyes: Conjunctivae and EOM are normal. Right eye exhibits no discharge. Left eye exhibits no discharge. No scleral icterus.  Neck: Normal range of motion. Carotid bruit is not present. No erythema present. No thyromegaly present.  Cardiovascular: Normal rate, regular rhythm, normal heart sounds and normal pulses.   Pulmonary/Chest: Effort normal. No respiratory distress. She has no wheezes. Right breast exhibits no mass, no nipple discharge, no skin change and no tenderness. Left breast exhibits no mass, no nipple discharge, no skin change and no tenderness.  Abdominal: Soft. Bowel sounds are normal. There is no hepatosplenomegaly. There is no tenderness. There is no CVA tenderness.  Musculoskeletal: Normal range of motion.  Lymphadenopathy:    She has no cervical adenopathy.    She has no axillary adenopathy.  Neurological: She is alert and oriented to person, place, and time. She has normal reflexes. No cranial nerve deficit or sensory deficit.  Foot exam - normal skin, nails, pulses and sensation bilaterally  Skin: Skin is warm, dry and intact. No rash noted.  Psychiatric: She has a normal mood and affect. Her speech is normal and behavior is normal. Thought content normal.  Nursing note  and vitals reviewed.   BP 118/74 mmHg  Pulse 89  Resp 16  Ht 5' (1.524 m)  Wt 216 lb 3.2 oz (98.068 kg)  BMI 42.22 kg/m2  SpO2 98%  Assessment and Plan: 1. Annual physical exam Colonoscopy, TDaP and PPV-23 are up to date - POCT urinalysis dipstick  2. Essential (primary) hypertension controlled - CBC with Differential/Platelet - losartan (COZAAR) 50 MG tablet; Take 1 tablet (50 mg total) by mouth daily.  Dispense: 90 tablet; Refill: 3  3. Controlled type 2 DM with microalbuminuria or microproteinuria Continue current therapy - Comprehensive metabolic panel - Hemoglobin A1c - TSH - Microalbumin / creatinine urine ratio - metFORMIN (GLUCOPHAGE) 500 MG tablet; Take 1 tablet (500 mg total) by mouth 2 (two) times daily.  Dispense: 180 tablet; Refill: 3  4. Hyperlipidemia associated with type 2 diabetes mellitus (HCC) On statin therapy - Lipid panel - atorvastatin (LIPITOR) 10 MG tablet; Take 1 tablet (10 mg total) by mouth daily.  Dispense: 90 tablet; Refill: 3  5. Breast cancer screening - MM DIGITAL SCREENING BILATERAL; Future  6. Need for hepatitis C screening test - Hepatitis C antibody   Halina Maidens, MD [facility]  08/17/2015

## 2015-08-17 NOTE — Patient Instructions (Signed)
DASH Eating Plan  DASH stands for "Dietary Approaches to Stop Hypertension." The DASH eating plan is a healthy eating plan that has been shown to reduce high blood pressure (hypertension). Additional health benefits may include reducing the risk of type 2 diabetes mellitus, heart disease, and stroke. The DASH eating plan may also help with weight loss.  WHAT DO I NEED TO KNOW ABOUT THE DASH EATING PLAN?  For the DASH eating plan, you will follow these general guidelines:  · Choose foods with a percent daily value for sodium of less than 5% (as listed on the food label).  · Use salt-free seasonings or herbs instead of table salt or sea salt.  · Check with your health care provider or pharmacist before using salt substitutes.  · Eat lower-sodium products, often labeled as "lower sodium" or "no salt added."  · Eat fresh foods.  · Eat more vegetables, fruits, and low-fat dairy products.  · Choose whole grains. Look for the word "whole" as the first word in the ingredient list.  · Choose fish and skinless chicken or turkey more often than red meat. Limit fish, poultry, and meat to 6 oz (170 g) each day.  · Limit sweets, desserts, sugars, and sugary drinks.  · Choose heart-healthy fats.  · Limit cheese to 1 oz (28 g) per day.  · Eat more home-cooked food and less restaurant, buffet, and fast food.  · Limit fried foods.  · Cook foods using methods other than frying.  · Limit canned vegetables. If you do use them, rinse them well to decrease the sodium.  · When eating at a restaurant, ask that your food be prepared with less salt, or no salt if possible.  WHAT FOODS CAN I EAT?  Seek help from a dietitian for individual calorie needs.  Grains  Whole grain or whole wheat bread. Brown rice. Whole grain or whole wheat pasta. Quinoa, bulgur, and whole grain cereals. Low-sodium cereals. Corn or whole wheat flour tortillas. Whole grain cornbread. Whole grain crackers. Low-sodium crackers.  Vegetables  Fresh or frozen vegetables  (raw, steamed, roasted, or grilled). Low-sodium or reduced-sodium tomato and vegetable juices. Low-sodium or reduced-sodium tomato sauce and paste. Low-sodium or reduced-sodium canned vegetables.   Fruits  All fresh, canned (in natural juice), or frozen fruits.  Meat and Other Protein Products  Ground beef (85% or leaner), grass-fed beef, or beef trimmed of fat. Skinless chicken or turkey. Ground chicken or turkey. Pork trimmed of fat. All fish and seafood. Eggs. Dried beans, peas, or lentils. Unsalted nuts and seeds. Unsalted canned beans.  Dairy  Low-fat dairy products, such as skim or 1% milk, 2% or reduced-fat cheeses, low-fat ricotta or cottage cheese, or plain low-fat yogurt. Low-sodium or reduced-sodium cheeses.  Fats and Oils  Tub margarines without trans fats. Light or reduced-fat mayonnaise and salad dressings (reduced sodium). Avocado. Safflower, olive, or canola oils. Natural peanut or almond butter.  Other  Unsalted popcorn and pretzels.  The items listed above may not be a complete list of recommended foods or beverages. Contact your dietitian for more options.  WHAT FOODS ARE NOT RECOMMENDED?  Grains  White bread. White pasta. White rice. Refined cornbread. Bagels and croissants. Crackers that contain trans fat.  Vegetables  Creamed or fried vegetables. Vegetables in a cheese sauce. Regular canned vegetables. Regular canned tomato sauce and paste. Regular tomato and vegetable juices.  Fruits  Dried fruits. Canned fruit in light or heavy syrup. Fruit juice.  Meat and Other Protein   Products  Fatty cuts of meat. Ribs, chicken wings, bacon, sausage, bologna, salami, chitterlings, fatback, hot dogs, bratwurst, and packaged luncheon meats. Salted nuts and seeds. Canned beans with salt.  Dairy  Whole or 2% milk, cream, half-and-half, and cream cheese. Whole-fat or sweetened yogurt. Full-fat cheeses or blue cheese. Nondairy creamers and whipped toppings. Processed cheese, cheese spreads, or cheese  curds.  Condiments  Onion and garlic salt, seasoned salt, table salt, and sea salt. Canned and packaged gravies. Worcestershire sauce. Tartar sauce. Barbecue sauce. Teriyaki sauce. Soy sauce, including reduced sodium. Steak sauce. Fish sauce. Oyster sauce. Cocktail sauce. Horseradish. Ketchup and mustard. Meat flavorings and tenderizers. Bouillon cubes. Hot sauce. Tabasco sauce. Marinades. Taco seasonings. Relishes.  Fats and Oils  Butter, stick margarine, lard, shortening, ghee, and bacon fat. Coconut, palm kernel, or palm oils. Regular salad dressings.  Other  Pickles and olives. Salted popcorn and pretzels.  The items listed above may not be a complete list of foods and beverages to avoid. Contact your dietitian for more information.  WHERE CAN I FIND MORE INFORMATION?  National Heart, Lung, and Blood Institute: www.nhlbi.nih.gov/health/health-topics/topics/dash/     This information is not intended to replace advice given to you by your health care provider. Make sure you discuss any questions you have with your health care provider.     Document Released: 01/16/2011 Document Revised: 02/17/2014 Document Reviewed: 12/01/2012  Elsevier Interactive Patient Education ©2016 Elsevier Inc.

## 2015-08-18 LAB — COMPREHENSIVE METABOLIC PANEL
A/G RATIO: 1.9 (ref 1.2–2.2)
ALK PHOS: 114 IU/L (ref 39–117)
ALT: 22 IU/L (ref 0–32)
AST: 18 IU/L (ref 0–40)
Albumin: 4.4 g/dL (ref 3.5–5.5)
BUN/Creatinine Ratio: 24 — ABNORMAL HIGH (ref 9–23)
BUN: 13 mg/dL (ref 6–24)
Bilirubin Total: 0.4 mg/dL (ref 0.0–1.2)
CO2: 21 mmol/L (ref 18–29)
CREATININE: 0.55 mg/dL — AB (ref 0.57–1.00)
Calcium: 9.6 mg/dL (ref 8.7–10.2)
Chloride: 101 mmol/L (ref 96–106)
GFR, EST AFRICAN AMERICAN: 120 mL/min/{1.73_m2} (ref >59)
GFR, EST NON AFRICAN AMERICAN: 104 mL/min/{1.73_m2} (ref >59)
GLOBULIN, TOTAL: 2.3 g/dL (ref 1.5–4.5)
Glucose: 108 mg/dL — ABNORMAL HIGH (ref 65–99)
POTASSIUM: 4.4 mmol/L (ref 3.5–5.2)
Sodium: 140 mmol/L (ref 134–144)
Total Protein: 6.7 g/dL (ref 6.0–8.5)

## 2015-08-18 LAB — CBC WITH DIFFERENTIAL/PLATELET
BASOS ABS: 0 10*3/uL (ref 0.0–0.2)
Basos: 0 %
EOS (ABSOLUTE): 0.2 10*3/uL (ref 0.0–0.4)
EOS: 2 %
HEMATOCRIT: 41.1 % (ref 34.0–46.6)
Hemoglobin: 13.1 g/dL (ref 11.1–15.9)
IMMATURE GRANULOCYTES: 0 %
Immature Grans (Abs): 0 10*3/uL (ref 0.0–0.1)
LYMPHS ABS: 2.7 10*3/uL (ref 0.7–3.1)
Lymphs: 36 %
MCH: 27.1 pg (ref 26.6–33.0)
MCHC: 31.9 g/dL (ref 31.5–35.7)
MCV: 85 fL (ref 79–97)
MONOS ABS: 0.3 10*3/uL (ref 0.1–0.9)
Monocytes: 4 %
NEUTROS PCT: 58 %
Neutrophils Absolute: 4.4 10*3/uL (ref 1.4–7.0)
PLATELETS: 310 10*3/uL (ref 150–379)
RBC: 4.84 x10E6/uL (ref 3.77–5.28)
RDW: 14.6 % (ref 12.3–15.4)
WBC: 7.6 10*3/uL (ref 3.4–10.8)

## 2015-08-18 LAB — HEMOGLOBIN A1C
ESTIMATED AVERAGE GLUCOSE: 137 mg/dL
Hgb A1c MFr Bld: 6.4 % — ABNORMAL HIGH (ref 4.8–5.6)

## 2015-08-18 LAB — LIPID PANEL
CHOLESTEROL TOTAL: 168 mg/dL (ref 100–199)
Chol/HDL Ratio: 2.9 ratio units (ref 0.0–4.4)
HDL: 58 mg/dL (ref >39)
LDL Calculated: 84 mg/dL (ref 0–99)
TRIGLYCERIDES: 130 mg/dL (ref 0–149)
VLDL CHOLESTEROL CAL: 26 mg/dL (ref 5–40)

## 2015-08-18 LAB — TSH: TSH: 1.01 u[IU]/mL (ref 0.450–4.500)

## 2015-08-18 LAB — HEPATITIS C ANTIBODY

## 2015-08-21 LAB — MICROALBUMIN / CREATININE URINE RATIO
Creatinine, Urine: 104.9 mg/dL
MICROALB/CREAT RATIO: 569.2 mg/g creat — ABNORMAL HIGH (ref 0.0–30.0)
Microalbumin, Urine: 597.1 ug/mL

## 2015-09-06 ENCOUNTER — Ambulatory Visit
Admission: RE | Admit: 2015-09-06 | Discharge: 2015-09-06 | Disposition: A | Discharge status: Home / Self Care | Payer: BC Managed Care – PPO | Source: Ambulatory Visit | Attending: Internal Medicine | Admitting: Internal Medicine

## 2015-09-06 ENCOUNTER — Other Ambulatory Visit: Payer: Self-pay | Admitting: Internal Medicine

## 2015-09-06 DIAGNOSIS — Z1239 Encounter for other screening for malignant neoplasm of breast: Secondary | ICD-10-CM

## 2015-09-06 DIAGNOSIS — Z1231 Encounter for screening mammogram for malignant neoplasm of breast: Secondary | ICD-10-CM | POA: Insufficient documentation

## 2015-12-02 ENCOUNTER — Other Ambulatory Visit: Payer: Self-pay | Admitting: Internal Medicine

## 2016-01-11 ENCOUNTER — Other Ambulatory Visit: Payer: Self-pay | Admitting: Internal Medicine

## 2016-02-18 ENCOUNTER — Ambulatory Visit: Payer: BC Managed Care – PPO | Admitting: Internal Medicine

## 2016-02-21 ENCOUNTER — Other Ambulatory Visit: Payer: Self-pay | Admitting: Internal Medicine

## 2016-02-25 ENCOUNTER — Encounter: Payer: Self-pay | Admitting: Internal Medicine

## 2016-02-25 ENCOUNTER — Ambulatory Visit (INDEPENDENT_AMBULATORY_CARE_PROVIDER_SITE_OTHER): Payer: BC Managed Care – PPO | Admitting: Internal Medicine

## 2016-02-25 VITALS — BP 118/74 | HR 84 | Temp 98.4°F | Ht 60.0 in | Wt 217.0 lb

## 2016-02-25 DIAGNOSIS — B9789 Other viral agents as the cause of diseases classified elsewhere: Secondary | ICD-10-CM

## 2016-02-25 DIAGNOSIS — J069 Acute upper respiratory infection, unspecified: Secondary | ICD-10-CM

## 2016-02-25 DIAGNOSIS — R809 Proteinuria, unspecified: Secondary | ICD-10-CM

## 2016-02-25 DIAGNOSIS — E785 Hyperlipidemia, unspecified: Secondary | ICD-10-CM | POA: Diagnosis not present

## 2016-02-25 DIAGNOSIS — E1129 Type 2 diabetes mellitus with other diabetic kidney complication: Secondary | ICD-10-CM | POA: Diagnosis not present

## 2016-02-25 DIAGNOSIS — I10 Essential (primary) hypertension: Secondary | ICD-10-CM | POA: Diagnosis not present

## 2016-02-25 DIAGNOSIS — E1169 Type 2 diabetes mellitus with other specified complication: Secondary | ICD-10-CM | POA: Diagnosis not present

## 2016-02-25 MED ORDER — GLUCOSE BLOOD VI STRP: ORAL_STRIP | 12 refills | Status: DC

## 2016-02-25 MED ORDER — METFORMIN HCL 500 MG PO TABS: 500.0000 mg | ORAL_TABLET | Freq: Two times a day (BID) | ORAL | 3 refills | Status: DC

## 2016-02-25 NOTE — Patient Instructions (Signed)
DASH Eating Plan DASH stands for "Dietary Approaches to Stop Hypertension." The DASH eating plan is a healthy eating plan that has been shown to reduce high blood pressure (hypertension). Additional health benefits may include reducing the risk of type 2 diabetes mellitus, heart disease, and stroke. The DASH eating plan may also help with weight loss. What do I need to know about the DASH eating plan? For the DASH eating plan, you will follow these general guidelines:  Choose foods with less than 150 milligrams of sodium per serving (as listed on the food label).  Use salt-free seasonings or herbs instead of table salt or sea salt.  Check with your health care provider or pharmacist before using salt substitutes.  Eat lower-sodium products. These are often labeled as "low-sodium" or "no salt added."  Eat fresh foods. Avoid eating a lot of canned foods.  Eat more vegetables, fruits, and low-fat dairy products.  Choose whole grains. Look for the word "whole" as the first word in the ingredient list.  Choose fish and skinless chicken or turkey more often than red meat. Limit fish, poultry, and meat to 6 oz (170 g) each day.  Limit sweets, desserts, sugars, and sugary drinks.  Choose heart-healthy fats.  Eat more home-cooked food and less restaurant, buffet, and fast food.  Limit fried foods.  Do not fry foods. Cook foods using methods such as baking, boiling, grilling, and broiling instead.  When eating at a restaurant, ask that your food be prepared with less salt, or no salt if possible. What foods can I eat? Seek help from a dietitian for individual calorie needs. Grains  Whole grain or whole wheat bread. Brown rice. Whole grain or whole wheat pasta. Quinoa, bulgur, and whole grain cereals. Low-sodium cereals. Corn or whole wheat flour tortillas. Whole grain cornbread. Whole grain crackers. Low-sodium crackers. Vegetables  Fresh or frozen vegetables (raw, steamed, roasted, or  grilled). Low-sodium or reduced-sodium tomato and vegetable juices. Low-sodium or reduced-sodium tomato sauce and paste. Low-sodium or reduced-sodium canned vegetables. Fruits  All fresh, canned (in natural juice), or frozen fruits. Meat and Other Protein Products  Ground beef (85% or leaner), grass-fed beef, or beef trimmed of fat. Skinless chicken or turkey. Ground chicken or turkey. Pork trimmed of fat. All fish and seafood. Eggs. Dried beans, peas, or lentils. Unsalted nuts and seeds. Unsalted canned beans. Dairy  Low-fat dairy products, such as skim or 1% milk, 2% or reduced-fat cheeses, low-fat ricotta or cottage cheese, or plain low-fat yogurt. Low-sodium or reduced-sodium cheeses. Fats and Oils  Tub margarines without trans fats. Light or reduced-fat mayonnaise and salad dressings (reduced sodium). Avocado. Safflower, olive, or canola oils. Natural peanut or almond butter. Other  Unsalted popcorn and pretzels. The items listed above may not be a complete list of recommended foods or beverages. Contact your dietitian for more options.  What foods are not recommended? Grains  White bread. White pasta. White rice. Refined cornbread. Bagels and croissants. Crackers that contain trans fat. Vegetables  Creamed or fried vegetables. Vegetables in a cheese sauce. Regular canned vegetables. Regular canned tomato sauce and paste. Regular tomato and vegetable juices. Fruits  Canned fruit in light or heavy syrup. Fruit juice. Meat and Other Protein Products  Fatty cuts of meat. Ribs, chicken wings, bacon, sausage, bologna, salami, chitterlings, fatback, hot dogs, bratwurst, and packaged luncheon meats. Salted nuts and seeds. Canned beans with salt. Dairy  Whole or 2% milk, cream, half-and-half, and cream cheese. Whole-fat or sweetened yogurt. Full-fat cheeses   or blue cheese. Nondairy creamers and whipped toppings. Processed cheese, cheese spreads, or cheese curds. Condiments  Onion and garlic  salt, seasoned salt, table salt, and sea salt. Canned and packaged gravies. Worcestershire sauce. Tartar sauce. Barbecue sauce. Teriyaki sauce. Soy sauce, including reduced sodium. Steak sauce. Fish sauce. Oyster sauce. Cocktail sauce. Horseradish. Ketchup and mustard. Meat flavorings and tenderizers. Bouillon cubes. Hot sauce. Tabasco sauce. Marinades. Taco seasonings. Relishes. Fats and Oils  Butter, stick margarine, lard, shortening, ghee, and bacon fat. Coconut, palm kernel, or palm oils. Regular salad dressings. Other  Pickles and olives. Salted popcorn and pretzels. The items listed above may not be a complete list of foods and beverages to avoid. Contact your dietitian for more information.  Where can I find more information? National Heart, Lung, and Blood Institute: www.nhlbi.nih.gov/health/health-topics/topics/dash/ This information is not intended to replace advice given to you by your health care provider. Make sure you discuss any questions you have with your health care provider. Document Released: 01/16/2011 Document Revised: 07/05/2015 Document Reviewed: 12/01/2012 Elsevier Interactive Patient Education  2017 Elsevier Inc.  

## 2016-02-25 NOTE — Progress Notes (Signed)
Date:  02/25/2016   Name:  Brittany Forbes   DOB:  Apr 16, 1960   MRN:  [id number]   Chief Complaint: Diabetes (Pt stated sugar is 140 this morning.) and Cough Diabetes  She presents for her follow-up diabetic visit. She has type 2 diabetes mellitus. Pertinent negatives for hypoglycemia include no headaches, sweats or tremors. Pertinent negatives for diabetes include no chest pain, no fatigue, no foot paresthesias, no foot ulcerations, no polydipsia, no polyuria and no weight loss. Symptoms are stable. Diabetic complications include nephropathy. Current diabetic treatment includes oral agent (monotherapy) and diet. She is compliant with treatment all of the time. Her breakfast blood glucose is taken between 6-7 am. Her breakfast blood glucose range is generally 130-140 mg/dl. (Slightly higher recently but started on Nutri system and back to exercise)  Hypertension  This is a chronic problem. The problem is controlled. Pertinent negatives include no chest pain, headaches, palpitations, shortness of breath or sweats.  Hyperlipidemia  This is a chronic problem. The problem is controlled. Recent lipid tests were reviewed and are normal. Pertinent negatives include no chest pain or shortness of breath. Current antihyperlipidemic treatment includes statins. The current treatment provides significant improvement of lipids.  Cough  This is a new problem. The current episode started in the past 7 days. The problem has been gradually improving. The problem occurs every few hours. The cough is non-productive. Pertinent negatives include no chest pain, fever, headaches, sore throat, shortness of breath, sweats, weight loss or wheezing. She has tried OTC cough suppressant for the symptoms. The treatment provided significant relief.   Lab Results  Component Value Date   HGBA1C 6.4 (H) 08/17/2015   Lab Results  Component Value Date   CHOL 168 08/17/2015   HDL 58 08/17/2015   LDLCALC 84 08/17/2015   TRIG  130 08/17/2015   CHOLHDL 2.9 08/17/2015   Lab Results  Component Value Date   CREATININE 0.55 (L) 08/17/2015      Review of Systems  Constitutional: Negative for appetite change, fatigue, fever, unexpected weight change and weight loss.  HENT: Negative for sore throat, tinnitus and trouble swallowing.   Eyes: Negative for visual disturbance.  Respiratory: Negative for cough, chest tightness, shortness of breath and wheezing.   Cardiovascular: Negative for chest pain, palpitations and leg swelling.  Gastrointestinal: Negative for abdominal pain.  Endocrine: Negative for polydipsia and polyuria.  Genitourinary: Negative for dysuria and hematuria.  Musculoskeletal: Negative for arthralgias.  Neurological: Negative for tremors, numbness and headaches.  Psychiatric/Behavioral: Negative for dysphoric mood.    Patient Active Problem List   Diagnosis Date Noted  . Type 2 diabetes mellitus with microalbuminuria, without long-term current use of insulin ([place]) 06/06/2014  . Essential (primary) hypertension 06/06/2014  . Hyperlipidemia associated with type 2 diabetes mellitus ([place]) 06/06/2014  . Persistent proteinuria associated with type 2 diabetes mellitus ([place]) 06/06/2014  . Abnormal liver enzymes 09/12/2011  . Parathyroid adenoma 05/29/2011    Prior to Admission medications   Medication Sig Start Date End Date Taking? Authorizing Provider  atorvastatin (LIPITOR) 10 MG tablet Take 1 tablet (10 mg total) by mouth daily. 08/17/15  Yes Glean Hess, MD  GLUCOSAMINE HCL-MSM PO Take by mouth.   Yes Historical Provider, MD  glucose blood (ACCU-CHEK COMFORT CURVE) test strip Use 1 each 2 (two) times daily. Use as instructed. 06/19/11  Yes Historical Provider, MD  glucose blood (ONE TOUCH ULTRA TEST) test strip Use as instructed 08/15/14  Yes Glean Hess, MD  losartan (COZAAR) 50 MG tablet TAKE 1 TABLET DAILY 01/11/16  Yes Glean Hess, MD  metFORMIN (GLUCOPHAGE) 500 MG tablet TAKE 1  TABLET TWICE A DAY 02/21/16  Yes Glean Hess, MD  MULTIPLE VITAMIN PO Take 1 tablet by mouth daily.   Yes Historical Provider, MD  [facility] DELICA LANCETS [id number] MISC Use 1 each 2 (two) times daily. Extra fine 12/01/11  Yes Historical Provider, MD    Allergies  Allergen Reactions  . Ace Inhibitors     Other reaction(s): Cough    Past Surgical History:  Procedure Laterality Date  . KIDNEY STONE SURGERY Left [id number]  . PARATHYROIDECTOMY  [id number]   [facility]  . TONSILLECTOMY      Social History  Substance Use Topics  . Smoking status: Never Smoker  . Smokeless tobacco: Not on file  . Alcohol use 0.0 oz/week     Medication list has been reviewed and updated.   Physical Exam  Constitutional: She is oriented to person, place, and time. She appears well-developed. No distress.  HENT:  Head: Normocephalic and atraumatic.  Nose: Right sinus exhibits no maxillary sinus tenderness. Left sinus exhibits no maxillary sinus tenderness.  Neck: Normal range of motion. Neck supple. No thyromegaly present.  Cardiovascular: Normal rate, regular rhythm and normal heart sounds.   Pulmonary/Chest: Effort normal and breath sounds normal. No respiratory distress. She has no wheezes. She has no rales.  Musculoskeletal: She exhibits no edema or tenderness.  Neurological: She is alert and oriented to person, place, and time.  Skin: Skin is warm and dry. No rash noted.  Psychiatric: She has a normal mood and affect. Her behavior is normal. Thought content normal.  Nursing note and vitals reviewed.   BP 118/74   Pulse 84   Temp 98.4 F (36.9 C)   Ht 5' (1.524 m)   Wt 217 lb (98.4 kg)   SpO2 99%   BMI 42.38 kg/m   Assessment and Plan: 1. Essential (primary) hypertension controlled  2. Type 2 diabetes mellitus with microalbuminuria, without long-term current use of insulin (HCC) Continue current therapy Continue with diet and exercise for improved control immunizations are up to date -  metFORMIN (GLUCOPHAGE) 500 MG tablet; Take 1 tablet (500 mg total) by mouth 2 (two) times daily.  Dispense: 180 tablet; Refill: 3 - glucose blood (ONE TOUCH ULTRA TEST) test strip; Use as instructed  Dispense: 100 each; Refill: 12 - Hemoglobin A1c  3. Hyperlipidemia associated with type 2 diabetes mellitus ([place]) On statin therapy   4. Viral URI Continue Mucinex DM Follow up if worsening   Halina Maidens, MD [facility]  02/25/2016

## 2016-02-26 LAB — HEMOGLOBIN A1C
ESTIMATED AVERAGE GLUCOSE: 148 mg/dL
Hgb A1c MFr Bld: 6.8 % — ABNORMAL HIGH (ref 4.8–5.6)

## 2016-02-29 ENCOUNTER — Other Ambulatory Visit: Payer: Self-pay | Admitting: Internal Medicine

## 2016-02-29 DIAGNOSIS — R809 Proteinuria, unspecified: Principal | ICD-10-CM

## 2016-02-29 DIAGNOSIS — E1129 Type 2 diabetes mellitus with other diabetic kidney complication: Secondary | ICD-10-CM

## 2016-02-29 MED ORDER — GLUCOSE BLOOD VI STRP: ORAL_STRIP | 3 refills | Status: DC

## 2016-04-02 LAB — HM DIABETES EYE EXAM

## 2016-04-18 ENCOUNTER — Ambulatory Visit (INDEPENDENT_AMBULATORY_CARE_PROVIDER_SITE_OTHER): Payer: BC Managed Care – PPO | Admitting: Family Medicine

## 2016-04-18 VITALS — BP 126/72 | HR 104 | Temp 99.2°F | Ht 60.0 in | Wt 206.0 lb

## 2016-04-18 DIAGNOSIS — J40 Bronchitis, not specified as acute or chronic: Secondary | ICD-10-CM

## 2016-04-18 DIAGNOSIS — R509 Fever, unspecified: Secondary | ICD-10-CM

## 2016-04-18 DIAGNOSIS — J0100 Acute maxillary sinusitis, unspecified: Secondary | ICD-10-CM

## 2016-04-18 LAB — POCT INFLUENZA A/B
INFLUENZA B, POC: NEGATIVE
Influenza A, POC: NEGATIVE

## 2016-04-18 MED ORDER — AMOXICILLIN 500 MG PO CAPS: 500.0000 mg | ORAL_CAPSULE | Freq: Three times a day (TID) | ORAL | 0 refills | Status: DC

## 2016-04-18 NOTE — Progress Notes (Signed)
Name: Brittany Forbes   MRN: [id number]    DOB: Oct 18, 1960   Date:04/18/2016       Progress Note  Subjective  Chief Complaint  Chief Complaint  Patient presents with  . Generalized Body Aches    Congestion, cough, and fever. This morning fever was 104. No vomit or diarrhea.    Fever   This is a new problem. The current episode started today. The problem has been waxing and waning. The maximum temperature noted was 103 to 103.9 F. The temperature was taken using an oral thermometer. Associated symptoms include congestion, coughing, muscle aches and a sore throat. Pertinent negatives include no abdominal pain, chest pain, diarrhea, ear pain, headaches, nausea, rash, urinary pain, vomiting or wheezing. She has tried NSAIDs for the symptoms. The treatment provided mild relief.  Risk factors: sick contacts   Risk factors: no recent travel   Cough  This is a new problem. The current episode started yesterday. The problem has been waxing and waning. The cough is productive of purulent sputum. Associated symptoms include a fever, myalgias, nasal congestion and a sore throat. Pertinent negatives include no chest pain, chills, ear pain, headaches, heartburn, hemoptysis, rash, shortness of breath, weight loss or wheezing. She has tried nothing for the symptoms. The treatment provided mild relief. There is no history of environmental allergies.    No problem-specific Assessment & Plan notes found for this encounter.   No past medical history on file.  Past Surgical History:  Procedure Laterality Date  . KIDNEY STONE SURGERY Left [id number]  . PARATHYROIDECTOMY  [id number]   [facility]  . TONSILLECTOMY      Family History  Problem Relation Age of Onset  . Cancer Mother     lung  . Diabetes Father   . Heart disease Father   . Breast cancer Maternal Aunt 75    Social History   Social History  . Marital status: Single    Spouse name: N/A  . Number of children: N/A  . Years of education: N/A    Occupational History  . Not on file.   Social History Main Topics  . Smoking status: Never Smoker  . Smokeless tobacco: Not on file  . Alcohol use 0.0 oz/week  . Drug use: No  . Sexual activity: Not on file   Other Topics Concern  . Not on file   Social History Narrative  . No narrative on file    Allergies  Allergen Reactions  . Ace Inhibitors     Other reaction(s): Cough    Outpatient Medications Prior to Visit  Medication Sig Dispense Refill  . atorvastatin (LIPITOR) 10 MG tablet Take 1 tablet (10 mg total) by mouth daily. 90 tablet 3  . GLUCOSAMINE HCL-MSM PO Take by mouth.    Marland Kitchen glucose blood (ONE TOUCH ULTRA TEST) test strip Use to test blood sugar daily 100 each 3  . losartan (COZAAR) 50 MG tablet TAKE 1 TABLET DAILY 90 tablet 1  . metFORMIN (GLUCOPHAGE) 500 MG tablet Take 1 tablet (500 mg total) by mouth 2 (two) times daily. 180 tablet 3  . MULTIPLE VITAMIN PO Take 1 tablet by mouth daily.    Glory Rosebush DELICA LANCETS 96E MISC Use 1 each 2 (two) times daily. Extra fine     No facility-administered medications prior to visit.     Review of Systems  Constitutional: Positive for fever. Negative for chills, malaise/fatigue and weight loss.  HENT: Positive for congestion and sore throat. Negative for ear  discharge and ear pain.   Eyes: Negative for blurred vision.  Respiratory: Positive for cough. Negative for hemoptysis, sputum production, shortness of breath and wheezing.   Cardiovascular: Negative for chest pain, palpitations and leg swelling.  Gastrointestinal: Negative for abdominal pain, blood in stool, constipation, diarrhea, heartburn, melena, nausea and vomiting.  Genitourinary: Negative for dysuria, frequency, hematuria and urgency.  Musculoskeletal: Positive for myalgias. Negative for back pain, joint pain and neck pain.  Skin: Negative for rash.  Neurological: Negative for dizziness, tingling, sensory change, focal weakness and headaches.   Endo/Heme/Allergies: Negative for environmental allergies and polydipsia. Does not bruise/bleed easily.  Psychiatric/Behavioral: Negative for depression and suicidal ideas. The patient is not nervous/anxious and does not have insomnia.      Objective  Vitals:   04/18/16 1439  BP: 126/72  Pulse: (!) 104  Temp: 99.2 F (37.3 C)  SpO2: 96%  Weight: 206 lb (93.4 kg)  Height: 5' (1.524 m)    Physical Exam  Constitutional: She is well-developed, well-nourished, and in no distress. No distress.  HENT:  Head: Normocephalic and atraumatic.  Right Ear: Tympanic membrane, external ear and ear canal normal.  Left Ear: Tympanic membrane, external ear and ear canal normal.  Nose: Nose normal. Right sinus exhibits no maxillary sinus tenderness and no frontal sinus tenderness. Left sinus exhibits no maxillary sinus tenderness and no frontal sinus tenderness.  Mouth/Throat: Posterior oropharyngeal erythema present. No oropharyngeal exudate or posterior oropharyngeal edema.  Eyes: Conjunctivae and EOM are normal. Pupils are equal, round, and reactive to light. Right eye exhibits no discharge. Left eye exhibits no discharge.  Neck: Normal range of motion. Neck supple. No JVD present. No thyromegaly present.  Cardiovascular: Normal rate, regular rhythm, normal heart sounds and intact distal pulses.  Exam reveals no gallop and no friction rub.   No murmur heard. Pulmonary/Chest: Effort normal and breath sounds normal. She has no wheezes. She has no rales.  Abdominal: Soft. Bowel sounds are normal. She exhibits no mass. There is no tenderness. There is no guarding.  Musculoskeletal: Normal range of motion. She exhibits no edema.  Lymphadenopathy:    She has no cervical adenopathy.  Neurological: She is alert. She has normal reflexes.  Skin: Skin is warm and dry. She is not diaphoretic.  Psychiatric: Mood and affect normal.  Nursing note and vitals reviewed.     Assessment & Plan  Problem  List Items Addressed This Visit    None    Visit Diagnoses    Acute maxillary sinusitis, recurrence not specified    -  Primary   Relevant Medications   amoxicillin (AMOXIL) 500 MG capsule   Bronchitis       Relevant Medications   amoxicillin (AMOXIL) 500 MG capsule   Other Relevant Orders   DG Chest 2 View   Fever and chills       Relevant Orders   POCT Influenza A/B (Completed)   DG Chest 2 View      Meds ordered this encounter  Medications  . amoxicillin (AMOXIL) 500 MG capsule    Sig: Take 1 capsule (500 mg total) by mouth 3 (three) times daily.    Dispense:  30 capsule    Refill:  0      Dr. Otilio Miu [facility]  04/18/16

## 2016-05-10 ENCOUNTER — Other Ambulatory Visit: Payer: Self-pay | Admitting: Internal Medicine

## 2016-07-31 ENCOUNTER — Other Ambulatory Visit: Payer: Self-pay | Admitting: Internal Medicine

## 2016-07-31 DIAGNOSIS — Z1231 Encounter for screening mammogram for malignant neoplasm of breast: Secondary | ICD-10-CM

## 2016-08-22 ENCOUNTER — Encounter: Payer: Self-pay | Admitting: Internal Medicine

## 2016-08-22 ENCOUNTER — Other Ambulatory Visit: Payer: Self-pay | Admitting: Internal Medicine

## 2016-08-22 ENCOUNTER — Ambulatory Visit (INDEPENDENT_AMBULATORY_CARE_PROVIDER_SITE_OTHER): Payer: BC Managed Care – PPO | Admitting: Internal Medicine

## 2016-08-22 ENCOUNTER — Telehealth: Payer: Self-pay | Admitting: Internal Medicine

## 2016-08-22 VITALS — BP 120/64 | HR 83 | Ht 60.0 in | Wt 208.6 lb

## 2016-08-22 DIAGNOSIS — I10 Essential (primary) hypertension: Secondary | ICD-10-CM

## 2016-08-22 DIAGNOSIS — E785 Hyperlipidemia, unspecified: Secondary | ICD-10-CM | POA: Diagnosis not present

## 2016-08-22 DIAGNOSIS — E1129 Type 2 diabetes mellitus with other diabetic kidney complication: Secondary | ICD-10-CM | POA: Diagnosis not present

## 2016-08-22 DIAGNOSIS — R3129 Other microscopic hematuria: Secondary | ICD-10-CM | POA: Insufficient documentation

## 2016-08-22 DIAGNOSIS — R809 Proteinuria, unspecified: Secondary | ICD-10-CM | POA: Diagnosis not present

## 2016-08-22 DIAGNOSIS — E1169 Type 2 diabetes mellitus with other specified complication: Secondary | ICD-10-CM

## 2016-08-22 DIAGNOSIS — Z9009 Acquired absence of other part of head and neck: Secondary | ICD-10-CM

## 2016-08-22 DIAGNOSIS — R748 Abnormal levels of other serum enzymes: Secondary | ICD-10-CM

## 2016-08-22 DIAGNOSIS — Z0000 Encounter for general adult medical examination without abnormal findings: Secondary | ICD-10-CM | POA: Diagnosis not present

## 2016-08-22 DIAGNOSIS — E892 Postprocedural hypoparathyroidism: Secondary | ICD-10-CM

## 2016-08-22 DIAGNOSIS — Z1239 Encounter for other screening for malignant neoplasm of breast: Secondary | ICD-10-CM

## 2016-08-22 DIAGNOSIS — Z1231 Encounter for screening mammogram for malignant neoplasm of breast: Secondary | ICD-10-CM | POA: Diagnosis not present

## 2016-08-22 DIAGNOSIS — Z9889 Other specified postprocedural states: Secondary | ICD-10-CM

## 2016-08-22 LAB — POCT URINALYSIS DIPSTICK
Bilirubin, UA: NEGATIVE
GLUCOSE UA: NEGATIVE
Ketones, UA: NEGATIVE
NITRITE UA: NEGATIVE
Spec Grav, UA: 1.025 (ref 1.010–1.025)
UROBILINOGEN UA: 0.2 U/dL
pH, UA: 6 (ref 5.0–8.0)

## 2016-08-22 MED ORDER — METFORMIN HCL 500 MG PO TABS: 500.0000 mg | ORAL_TABLET | Freq: Two times a day (BID) | ORAL | 3 refills | Status: DC

## 2016-08-22 NOTE — Progress Notes (Signed)
Date:  08/22/2016   Name:  Brittany Forbes   DOB:  Jun 01, 1960   MRN:  [id number]   Chief Complaint: Annual Exam (Breast Exam. No pap til 2020) Brittany Forbes is a 56 y.o. female who presents today for her Complete Annual Exam. She feels well. She reports exercising water aerobics. She reports she is sleeping well. She denies breast complaints.  Mammogram is scheduled.  Diabetes  She presents for her follow-up diabetic visit. She has type 2 diabetes mellitus. Pertinent negatives for hypoglycemia include no dizziness, headaches, nervousness/anxiousness or tremors. Pertinent negatives for diabetes include no chest pain, no fatigue, no polydipsia and no polyuria. Her weight is stable. She monitors blood glucose at home 3-4 x per week. Her breakfast blood glucose is taken between 6-7 am. Her breakfast blood glucose range is generally 130-140 mg/dl.  Hypertension  Pertinent negatives include no chest pain, headaches, palpitations or shortness of breath.  Hyperlipidemia  This is a chronic problem. Pertinent negatives include no chest pain or shortness of breath. Current antihyperlipidemic treatment includes statins.  parathyroid adenoma - removed in 2013.  She no longer sees the endocrinologist.  She denies muscle spasms or cramps, change in bowel habits, or kidney stones. Lab Results  Component Value Date   HGBA1C 6.8 (H) 02/25/2016     Review of Systems  Constitutional: Negative for chills, fatigue and fever.  HENT: Negative for congestion, hearing loss, tinnitus, trouble swallowing and voice change.   Eyes: Negative for visual disturbance.  Respiratory: Negative for cough, chest tightness, shortness of breath and wheezing.   Cardiovascular: Negative for chest pain, palpitations and leg swelling.  Gastrointestinal: Negative for abdominal pain, constipation, diarrhea and vomiting.  Endocrine: Negative for polydipsia and polyuria.  Genitourinary: Negative for dysuria, frequency, genital  sores, vaginal bleeding and vaginal discharge.  Musculoskeletal: Negative for arthralgias, gait problem and joint swelling.  Skin: Negative for color change and rash.  Allergic/Immunologic: Negative for environmental allergies.  Neurological: Negative for dizziness, tremors, light-headedness and headaches.  Hematological: Negative for adenopathy. Does not bruise/bleed easily.  Psychiatric/Behavioral: Negative for dysphoric mood and sleep disturbance. The patient is not nervous/anxious.     Patient Active Problem List   Diagnosis Date Noted  . Type 2 diabetes mellitus with microalbuminuria, without long-term current use of insulin ([place]) 06/06/2014  . Essential (primary) hypertension 06/06/2014  . Hyperlipidemia associated with type 2 diabetes mellitus ([place]) 06/06/2014  . Persistent proteinuria associated with type 2 diabetes mellitus ([place]) 06/06/2014  . Abnormal liver enzymes 09/12/2011  . Parathyroid adenoma 05/29/2011    Prior to Admission medications   Medication Sig Start Date End Date Taking? Authorizing Provider  atorvastatin (LIPITOR) 10 MG tablet TAKE 1 TABLET DAILY 07/31/16  Yes Glean Hess, MD  GLUCOSAMINE HCL-MSM PO Take by mouth.   Yes [provider]  glucose blood (ONE TOUCH ULTRA TEST) test strip Use to test blood sugar daily 02/29/16  Yes Glean Hess, MD  losartan (COZAAR) 50 MG tablet TAKE 1 TABLET DAILY 07/31/16  Yes Glean Hess, MD  metFORMIN (GLUCOPHAGE) 500 MG tablet TAKE 1 TABLET TWICE A DAY 05/10/16  Yes Glean Hess, MD  MULTIPLE VITAMIN PO Take 1 tablet by mouth daily.   Yes [provider]  [facility] DELICA LANCETS 37T MISC Use 1 each 2 (two) times daily. Extra fine 12/01/11  Yes [provider]    Allergies  Allergen Reactions  . Ace Inhibitors     Other reaction(s): Cough  Past Surgical History:  Procedure Laterality Date  . KIDNEY STONE SURGERY Left [id number]  . PARATHYROIDECTOMY  [id number]   [facility]  .  TONSILLECTOMY      Social History  Substance Use Topics  . Smoking status: Never Smoker  . Smokeless tobacco: Never Used  . Alcohol use 0.0 oz/week   Depression screen [facility] 2/9 08/22/2016 08/17/2015  Decreased Interest 0 0  Down, Depressed, Hopeless 0 0  PHQ - 2 Score 0 0     Medication list has been reviewed and updated.   Physical Exam  Constitutional: She is oriented to person, place, and time. She appears well-developed and well-nourished. No distress.  HENT:  Head: Normocephalic and atraumatic.  Right Ear: Tympanic membrane and ear canal normal.  Left Ear: Tympanic membrane and ear canal normal.  Nose: Right sinus exhibits no maxillary sinus tenderness. Left sinus exhibits no maxillary sinus tenderness.  Mouth/Throat: Uvula is midline and oropharynx is clear and moist.  Eyes: Conjunctivae and EOM are normal. Right eye exhibits no discharge. Left eye exhibits no discharge. No scleral icterus.  Neck: Normal range of motion. Carotid bruit is not present. No erythema present. No thyromegaly present.  Cardiovascular: Normal rate, regular rhythm, normal heart sounds and normal pulses.   Pulmonary/Chest: Effort normal. No respiratory distress. She has no wheezes. Right breast exhibits no mass, no nipple discharge, no skin change and no tenderness. Left breast exhibits no mass, no nipple discharge, no skin change and no tenderness.  Abdominal: Soft. Bowel sounds are normal. There is no hepatosplenomegaly. There is no tenderness. There is no CVA tenderness.  Musculoskeletal: Normal range of motion.  Lymphadenopathy:    She has no cervical adenopathy.    She has no axillary adenopathy.  Neurological: She is alert and oriented to person, place, and time. She has normal reflexes. No cranial nerve deficit or sensory deficit.  Skin: Skin is warm, dry and intact. No rash noted.  Psychiatric: She has a normal mood and affect. Her speech is normal and behavior is normal. Thought content normal.   Nursing note and vitals reviewed.  Urine dipstick shows positive for RBC's.  Micro exam: 8-10 RBC's per HPF.   BP 120/64   Pulse 83   Ht 5' (1.524 m)   Wt 208 lb 9.6 oz (94.6 kg)   SpO2 99%   BMI 40.74 kg/m   Assessment and Plan: 1. Annual physical exam Continue exercise - POCT urinalysis dipstick  2. Breast cancer screening Mammogram scheduled  3. Essential (primary) hypertension controlled - CBC with Differential/Platelet  4. Hyperlipidemia associated with type 2 diabetes mellitus (HCC) Continue diet, exercise and medications Continue statin therapy - Lipid panel  5. Type 2 diabetes mellitus with microalbuminuria, without long-term current use of insulin (HCC) Continue current regimen Eye exam is scheduled - Comprehensive metabolic panel - Hemoglobin A1c - Microalbumin / creatinine urine ratio - TSH  6. Abnormal liver enzymes monitoring - Comprehensive metabolic panel  7. History of parathyroidectomy Check labs - Comprehensive metabolic panel  8. Microscopic hematuria Will need to recheck UA in 1-2 months   Meds ordered this encounter  Medications  . metFORMIN (GLUCOPHAGE) 500 MG tablet    Sig: Take 1 tablet (500 mg total) by mouth 2 (two) times daily.    Dispense:  180 tablet    Refill:  [place], MD [facility]  08/22/2016

## 2016-08-22 NOTE — Patient Instructions (Signed)
Health Maintenance for Postmenopausal Women Menopause is a normal process in which your reproductive ability comes to an end. This process happens gradually over a span of months to years, usually between the ages of 22 and 9. Menopause is complete when you have missed 12 consecutive menstrual periods. It is important to talk with your health care provider about some of the most common conditions that affect postmenopausal women, such as heart disease, cancer, and bone loss (osteoporosis). Adopting a healthy lifestyle and getting preventive care can help to promote your health and wellness. Those actions can also lower your chances of developing some of these common conditions. What should I know about menopause? During menopause, you may experience a number of symptoms, such as:  Moderate-to-severe hot flashes.  Night sweats.  Decrease in sex drive.  Mood swings.  Headaches.  Tiredness.  Irritability.  Memory problems.  Insomnia.  Choosing to treat or not to treat menopausal changes is an individual decision that you make with your health care provider. What should I know about hormone replacement therapy and supplements? Hormone therapy products are effective for treating symptoms that are associated with menopause, such as hot flashes and night sweats. Hormone replacement carries certain risks, especially as you become older. If you are thinking about using estrogen or estrogen with progestin treatments, discuss the benefits and risks with your health care provider. What should I know about heart disease and stroke? Heart disease, heart attack, and stroke become more likely as you age. This may be due, in part, to the hormonal changes that your body experiences during menopause. These can affect how your body processes dietary fats, triglycerides, and cholesterol. Heart attack and stroke are both medical emergencies. There are many things that you can do to help prevent heart disease  and stroke:  Have your blood pressure checked at least every 1-2 years. High blood pressure causes heart disease and increases the risk of stroke.  If you are 53-22 years old, ask your health care provider if you should take aspirin to prevent a heart attack or a stroke.  Do not use any tobacco products, including cigarettes, chewing tobacco, or electronic cigarettes. If you need help quitting, ask your health care provider.  It is important to eat a healthy diet and maintain a healthy weight. ? Be sure to include plenty of vegetables, fruits, low-fat dairy products, and lean protein. ? Avoid eating foods that are high in solid fats, added sugars, or salt (sodium).  Get regular exercise. This is one of the most important things that you can do for your health. ? Try to exercise for at least 150 minutes each week. The type of exercise that you do should increase your heart rate and make you sweat. This is known as moderate-intensity exercise. ? Try to do strengthening exercises at least twice each week. Do these in addition to the moderate-intensity exercise.  Know your numbers.Ask your health care provider to check your cholesterol and your blood glucose. Continue to have your blood tested as directed by your health care provider.  What should I know about cancer screening? There are several types of cancer. Take the following steps to reduce your risk and to catch any cancer development as early as possible. Breast Cancer  Practice breast self-awareness. ? This means understanding how your breasts normally appear and feel. ? It also means doing regular breast self-exams. Let your health care provider know about any changes, no matter how small.  If you are 40  or older, have a clinician do a breast exam (clinical breast exam or CBE) every year. Depending on your age, family history, and medical history, it may be recommended that you also have a yearly breast X-ray (mammogram).  If you  have a family history of breast cancer, talk with your health care provider about genetic screening.  If you are at high risk for breast cancer, talk with your health care provider about having an MRI and a mammogram every year.  Breast cancer (BRCA) gene test is recommended for women who have family members with BRCA-related cancers. Results of the assessment will determine the need for genetic counseling and BRCA1 and for BRCA2 testing. BRCA-related cancers include these types: ? Breast. This occurs in males or females. ? Ovarian. ? Tubal. This may also be called fallopian tube cancer. ? Cancer of the abdominal or pelvic lining (peritoneal cancer). ? Prostate. ? Pancreatic.  Cervical, Uterine, and Ovarian Cancer Your health care provider may recommend that you be screened regularly for cancer of the pelvic organs. These include your ovaries, uterus, and vagina. This screening involves a pelvic exam, which includes checking for microscopic changes to the surface of your cervix (Pap test).  For women ages 21-65, health care providers may recommend a pelvic exam and a Pap test every three years. For women ages 79-65, they may recommend the Pap test and pelvic exam, combined with testing for human papilloma virus (HPV), every five years. Some types of HPV increase your risk of cervical cancer. Testing for HPV may also be done on women of any age who have unclear Pap test results.  Other health care providers may not recommend any screening for nonpregnant women who are considered low risk for pelvic cancer and have no symptoms. Ask your health care provider if a screening pelvic exam is right for you.  If you have had past treatment for cervical cancer or a condition that could lead to cancer, you need Pap tests and screening for cancer for at least 20 years after your treatment. If Pap tests have been discontinued for you, your risk factors (such as having a new sexual partner) need to be  reassessed to determine if you should start having screenings again. Some women have medical problems that increase the chance of getting cervical cancer. In these cases, your health care provider may recommend that you have screening and Pap tests more often.  If you have a family history of uterine cancer or ovarian cancer, talk with your health care provider about genetic screening.  If you have vaginal bleeding after reaching menopause, tell your health care provider.  There are currently no reliable tests available to screen for ovarian cancer.  Lung Cancer Lung cancer screening is recommended for adults 69-62 years old who are at high risk for lung cancer because of a history of smoking. A yearly low-dose CT scan of the lungs is recommended if you:  Currently smoke.  Have a history of at least 30 pack-years of smoking and you currently smoke or have quit within the past 15 years. A pack-year is smoking an average of one pack of cigarettes per day for one year.  Yearly screening should:  Continue until it has been 15 years since you quit.  Stop if you develop a health problem that would prevent you from having lung cancer treatment.  Colorectal Cancer  This type of cancer can be detected and can often be prevented.  Routine colorectal cancer screening usually begins at  age 42 and continues through age 45.  If you have risk factors for colon cancer, your health care provider may recommend that you be screened at an earlier age.  If you have a family history of colorectal cancer, talk with your health care provider about genetic screening.  Your health care provider may also recommend using home test kits to check for hidden blood in your stool.  A small camera at the end of a tube can be used to examine your colon directly (sigmoidoscopy or colonoscopy). This is done to check for the earliest forms of colorectal cancer.  Direct examination of the colon should be repeated every  5-10 years until age 71. However, if early forms of precancerous polyps or small growths are found or if you have a family history or genetic risk for colorectal cancer, you may need to be screened more often.  Skin Cancer  Check your skin from head to toe regularly.  Monitor any moles. Be sure to tell your health care provider: ? About any new moles or changes in moles, especially if there is a change in a mole's shape or color. ? If you have a mole that is larger than the size of a pencil eraser.  If any of your family members has a history of skin cancer, especially at a young age, talk with your health care provider about genetic screening.  Always use sunscreen. Apply sunscreen liberally and repeatedly throughout the day.  Whenever you are outside, protect yourself by wearing long sleeves, pants, a wide-brimmed hat, and sunglasses.  What should I know about osteoporosis? Osteoporosis is a condition in which bone destruction happens more quickly than new bone creation. After menopause, you may be at an increased risk for osteoporosis. To help prevent osteoporosis or the bone fractures that can happen because of osteoporosis, the following is recommended:  If you are 46-71 years old, get at least 1,000 mg of calcium and at least 600 mg of vitamin D per day.  If you are older than age 55 but younger than age 65, get at least 1,200 mg of calcium and at least 600 mg of vitamin D per day.  If you are older than age 54, get at least 1,200 mg of calcium and at least 800 mg of vitamin D per day.  Smoking and excessive alcohol intake increase the risk of osteoporosis. Eat foods that are rich in calcium and vitamin D, and do weight-bearing exercises several times each week as directed by your health care provider. What should I know about how menopause affects my mental health? Depression may occur at any age, but it is more common as you become older. Common symptoms of depression  include:  Low or sad mood.  Changes in sleep patterns.  Changes in appetite or eating patterns.  Feeling an overall lack of motivation or enjoyment of activities that you previously enjoyed.  Frequent crying spells.  Talk with your health care provider if you think that you are experiencing depression. What should I know about immunizations? It is important that you get and maintain your immunizations. These include:  Tetanus, diphtheria, and pertussis (Tdap) booster vaccine.  Influenza every year before the flu season begins.  Pneumonia vaccine.  Shingles vaccine.  Your health care provider may also recommend other immunizations. This information is not intended to replace advice given to you by your health care provider. Make sure you discuss any questions you have with your health care provider. Document Released: 03/21/2005  Document Revised: 08/17/2015 Document Reviewed: 10/31/2014 Elsevier Interactive Patient Education  2018 [employer]  

## 2016-08-22 NOTE — Telephone Encounter (Signed)
Please let patient know that she has microscopic blood in her urine with no evidence of infection.  This may be a benign condition but I would like to have a repeat urinalysis in 1-2 months.  This can be a nurse visit just to collect the specimen.  If the blood is persistent, will need to refer to Urology to investigate further.

## 2016-08-22 NOTE — Telephone Encounter (Signed)
Pt scheduled for Augsut 15th to recheck Urine - Nurse Visit. Advised her she will not see Doctor at this visit but we will call her about results after. She verbalized understanding.

## 2016-08-23 LAB — TSH: TSH: 1.46 u[IU]/mL (ref 0.450–4.500)

## 2016-08-23 LAB — CBC WITH DIFFERENTIAL/PLATELET
Basophils Absolute: 0.1 10*3/uL (ref 0.0–0.2)
Basos: 1 %
EOS (ABSOLUTE): 0.2 10*3/uL (ref 0.0–0.4)
EOS: 3 %
HEMATOCRIT: 40.8 % (ref 34.0–46.6)
Hemoglobin: 13.4 g/dL (ref 11.1–15.9)
IMMATURE GRANULOCYTES: 0 %
Immature Grans (Abs): 0 10*3/uL (ref 0.0–0.1)
LYMPHS ABS: 2.8 10*3/uL (ref 0.7–3.1)
Lymphs: 36 %
MCH: 27.7 pg (ref 26.6–33.0)
MCHC: 32.8 g/dL (ref 31.5–35.7)
MCV: 85 fL (ref 79–97)
MONOS ABS: 0.3 10*3/uL (ref 0.1–0.9)
Monocytes: 4 %
NEUTROS PCT: 56 %
Neutrophils Absolute: 4.4 10*3/uL (ref 1.4–7.0)
PLATELETS: 281 10*3/uL (ref 150–379)
RBC: 4.83 x10E6/uL (ref 3.77–5.28)
RDW: 14.6 % (ref 12.3–15.4)
WBC: 7.8 10*3/uL (ref 3.4–10.8)

## 2016-08-23 LAB — COMPREHENSIVE METABOLIC PANEL
A/G RATIO: 1.9 (ref 1.2–2.2)
ALK PHOS: 113 IU/L (ref 39–117)
ALT: 27 IU/L (ref 0–32)
AST: 19 IU/L (ref 0–40)
Albumin: 4.5 g/dL (ref 3.5–5.5)
BUN/Creatinine Ratio: 32 — ABNORMAL HIGH (ref 9–23)
BUN: 18 mg/dL (ref 6–24)
Bilirubin Total: 0.3 mg/dL (ref 0.0–1.2)
CALCIUM: 9.5 mg/dL (ref 8.7–10.2)
CO2: 22 mmol/L (ref 20–29)
Chloride: 102 mmol/L (ref 96–106)
Creatinine, Ser: 0.57 mg/dL (ref 0.57–1.00)
GFR calc Af Amer: 118 mL/min/{1.73_m2} (ref >59)
GFR calc non Af Amer: 103 mL/min/{1.73_m2} (ref >59)
GLOBULIN, TOTAL: 2.4 g/dL (ref 1.5–4.5)
Glucose: 112 mg/dL — ABNORMAL HIGH (ref 65–99)
POTASSIUM: 4.5 mmol/L (ref 3.5–5.2)
SODIUM: 140 mmol/L (ref 134–144)
Total Protein: 6.9 g/dL (ref 6.0–8.5)

## 2016-08-23 LAB — LIPID PANEL
CHOLESTEROL TOTAL: 175 mg/dL (ref 100–199)
Chol/HDL Ratio: 2.8 ratio (ref 0.0–4.4)
HDL: 62 mg/dL (ref >39)
LDL Calculated: 89 mg/dL (ref 0–99)
Triglycerides: 121 mg/dL (ref 0–149)
VLDL Cholesterol Cal: 24 mg/dL (ref 5–40)

## 2016-08-23 LAB — HEMOGLOBIN A1C
ESTIMATED AVERAGE GLUCOSE: 140 mg/dL
HEMOGLOBIN A1C: 6.5 % — AB (ref 4.8–5.6)

## 2016-09-09 ENCOUNTER — Ambulatory Visit
Admission: RE | Admit: 2016-09-09 | Discharge: 2016-09-09 | Disposition: A | Discharge status: Home / Self Care | Payer: BC Managed Care – PPO | Source: Ambulatory Visit | Attending: Internal Medicine | Admitting: Internal Medicine

## 2016-09-09 DIAGNOSIS — Z1231 Encounter for screening mammogram for malignant neoplasm of breast: Secondary | ICD-10-CM | POA: Diagnosis present

## 2016-09-24 ENCOUNTER — Other Ambulatory Visit: Payer: Self-pay | Admitting: Internal Medicine

## 2016-09-24 ENCOUNTER — Other Ambulatory Visit (INDEPENDENT_AMBULATORY_CARE_PROVIDER_SITE_OTHER): Payer: BC Managed Care – PPO | Admitting: Internal Medicine

## 2016-09-24 DIAGNOSIS — R319 Hematuria, unspecified: Secondary | ICD-10-CM

## 2016-09-24 DIAGNOSIS — R3129 Other microscopic hematuria: Secondary | ICD-10-CM | POA: Diagnosis not present

## 2016-09-24 LAB — POCT URINALYSIS DIPSTICK
BILIRUBIN UA: NEGATIVE
GLUCOSE UA: NEGATIVE
KETONES UA: NEGATIVE
NITRITE UA: NEGATIVE
PH UA: 5 (ref 5.0–8.0)
Urobilinogen, UA: 0.2 E.U./dL

## 2016-09-26 ENCOUNTER — Other Ambulatory Visit: Payer: Self-pay | Admitting: Internal Medicine

## 2016-09-26 DIAGNOSIS — R3129 Other microscopic hematuria: Secondary | ICD-10-CM

## 2016-09-26 LAB — URINE CULTURE

## 2016-09-26 NOTE — Progress Notes (Signed)
Pt agrees for referral. Prefers [place] or [place].

## 2016-10-02 ENCOUNTER — Encounter: Payer: Self-pay | Admitting: Internal Medicine

## 2016-10-07 ENCOUNTER — Ambulatory Visit: Payer: BC Managed Care – PPO | Admitting: Urology

## 2016-10-17 ENCOUNTER — Telehealth: Payer: Self-pay | Admitting: Urology

## 2016-10-17 NOTE — Telephone Encounter (Signed)
Patient called 10-16-16 and left a message on the voicemail to return her call. I called her back on 10-17-16 and had to leave a message on her voicemail to return my call.  Brittany Forbes

## 2016-10-21 NOTE — Progress Notes (Signed)
10/22/2016 3:53 PM   Brittany Forbes 1960-07-28 [id number]  Referring provider: Glean Hess, MD [address] [place], [place] [address]  Chief Complaint  Patient presents with  . New Patient (Initial Visit)    Microscopic hematuria referred by Dr. Army Melia    HPI: Patient is a 56 -year-old Caucasian female who presents today as a referral from their PCP, Dr. Glean Hess, for positive urine dips for blood.     Patient has had 2 POCT urine dips that were positive for blood.  No microscopic analysis of urine has been performed.  She does have a prior history of nephrolithiasis underwent a PCNL for a large left renal stone in [place] ten years ago.  She does not have a family medical history of nephrolithiasis, malignancies of the genitourinary tract or hematuria.   Today, she is having symptoms of nocturia.  Her UA today demonstrates > 30 RBC's and 6-10 WBC's.  He is not experiencing any suprapubic pain, abdominal pain or flank pain. He denies any recent fevers, chills, nausea or vomiting.   She has not had any recent imaging studies.   She is not a smoker.  She is not exposed to second hand smoke.   She has not worked with Sports administrator, trichloroethylene, etc.   She has a high BMI.     PMH: Past Medical History:  Diagnosis Date  . Diabetes ([place])   . Hematuria   . HLD (hyperlipidemia)   . HTN (hypertension)   . Kidney stone     Surgical History: Past Surgical History:  Procedure Laterality Date  . KIDNEY STONE SURGERY Left [id number]  . PARATHYROIDECTOMY  [id number]   [facility]  . TONSILLECTOMY      Home Medications:  Allergies as of 10/22/2016      Reactions   Ace Inhibitors    Other reaction(s): Cough      Medication List       Accurate as of 10/22/16  3:53 PM. Always use your most recent med list.          atorvastatin 10 MG tablet Commonly known as:  LIPITOR TAKE 1 TABLET DAILY   GLUCOSAMINE HCL-MSM PO Take by mouth.   glucose blood  test strip Commonly known as:  ONE TOUCH ULTRA TEST Use to test blood sugar daily   losartan 50 MG tablet Commonly known as:  COZAAR TAKE 1 TABLET DAILY   metFORMIN 500 MG tablet Commonly known as:  GLUCOPHAGE Take 1 tablet (500 mg total) by mouth 2 (two) times daily.   MULTIPLE VITAMIN PO Take 1 tablet by mouth daily.   ONETOUCH DELICA LANCETS 81W Misc Use 1 each 2 (two) times daily. Extra fine            Discharge Care Instructions        Start     Ordered   10/22/16 0000  Urinalysis, Complete     10/22/16 1453   10/22/16 0000  CULTURE, URINE COMPREHENSIVE     10/22/16 1453   10/22/16 0000  BUN+Creat     10/22/16 1453   10/22/16 0000  CT HEMATURIA WORKUP    Question Answer Comment  Reason for Exam (SYMPTOM  OR DIAGNOSIS REQUIRED) microscopic hematuria   Preferred imaging location? [facility]   Is the patient pregnant? No      10/22/16 1545      Allergies:  Allergies  Allergen Reactions  . Ace Inhibitors     Other reaction(s): Cough  Family History: Family History  Problem Relation Age of Onset  . Cancer Mother        lung  . Diabetes Father   . Heart disease Father   . Breast cancer Maternal Aunt 75  . Kidney cancer Neg Hx   . Bladder Cancer Neg Hx     Social History:  reports that she has never smoked. She has never used smokeless tobacco. She reports that she drinks alcohol. She reports that she does not use drugs.  ROS: UROLOGY Frequent Urination?: No Hard to postpone urination?: No Burning/pain with urination?: No Get up at night to urinate?: Yes Leakage of urine?: No Urine stream starts and stops?: No Trouble starting stream?: No Do you have to strain to urinate?: No Blood in urine?: Yes Urinary tract infection?: No Sexually transmitted disease?: No Injury to kidneys or bladder?: No Painful intercourse?: No Weak stream?: No Currently pregnant?: No Vaginal bleeding?: No Last menstrual period?:  n  Gastrointestinal Nausea?: No Vomiting?: No Indigestion/heartburn?: No Diarrhea?: No Constipation?: No  Constitutional Fever: No Night sweats?: No Weight loss?: No Fatigue?: No  Skin Skin rash/lesions?: No Itching?: No  Eyes Blurred vision?: No Double vision?: No  Ears/Nose/Throat Sore throat?: No Sinus problems?: No  Hematologic/Lymphatic Swollen glands?: No Easy bruising?: No  Cardiovascular Leg swelling?: No Chest pain?: No  Respiratory Cough?: No Shortness of breath?: No  Endocrine Excessive thirst?: No  Musculoskeletal Back pain?: No Joint pain?: No  Neurological Headaches?: No Dizziness?: No  Psychologic Depression?: No Anxiety?: No  Physical Exam: BP 137/83   Pulse 92   Ht 5' (1.524 m)   Wt 211 lb 1.6 oz (95.8 kg)   BMI 41.23 kg/m   Constitutional: Well nourished. Alert and oriented, No acute distress. HEENT: [place] AT, moist mucus membranes. Trachea midline, no masses. Cardiovascular: No clubbing, cyanosis, or edema. Respiratory: Normal respiratory effort, no increased work of breathing. GI: Abdomen is soft, non tender, non distended, no abdominal masses.  GU: No CVA tenderness.  No bladder fullness or masses.   Skin: No rashes, bruises or suspicious lesions. Lymph: No cervical or inguinal adenopathy. Neurologic: Grossly intact, no focal deficits, moving all 4 extremities. Psychiatric: Normal mood and affect.  Laboratory Data: Lab Results  Component Value Date   WBC 7.8 08/22/2016   HGB 13.4 08/22/2016   HCT 40.8 08/22/2016   MCV 85 08/22/2016   PLT 281 08/22/2016    Lab Results  Component Value Date   CREATININE 0.57 08/22/2016     Lab Results  Component Value Date   HGBA1C 6.5 (H) 08/22/2016    Lab Results  Component Value Date   TSH 1.460 08/22/2016       Component Value Date/Time   CHOL 175 08/22/2016 1017   HDL 62 08/22/2016 1017   CHOLHDL 2.8 08/22/2016 1017   LDLCALC 89 08/22/2016 1017    Lab  Results  Component Value Date   AST 19 08/22/2016   Lab Results  Component Value Date   ALT 27 08/22/2016     Urinalysis 6-10 WBC's.  > 30 RBC's.  See EPIC.    Assessment & Plan:    1. Microscopic hematuria  - I explained to the patient that there are a number of causes that can be associated with blood in the urine, such as stones, UTI's, damage to the urinary tract and/or cancer.  - At this time, I felt that the patient warranted further urologic evaluation.   The AUA guidelines state that a CT  urogram is the preferred imaging study to evaluate hematuria.  - I explained to the patient that a contrast material will be injected into a vein and that in rare instances, an allergic reaction can result and may even life threatening   The patient denies any allergies to contrast, iodine and/or seafood and is taking metformin.  - Her reproductive status is postmenopausal  - Following the imaging study,  I've recommended a cystoscopy. I described how this is performed, typically in an office setting with a flexible cystoscope. We described the risks, benefits, and possible side effects, the most common of which is a minor amount of blood in the urine and/or burning which usually resolves in 24 to 48 hours.    - The patient had the opportunity to ask questions which were answered. Based upon this discussion, the patient is willing to proceed. Therefore, I've ordered: a CT Urogram and cystoscopy.  - The patient will return following all of the above for discussion of the results.   - UA  - Urine culture  - BUN + creatinine      Return for CT Urogram report and cystoscopy.  These notes generated with voice recognition software. I apologize for typographical errors.  Zara Council, [facility] [address], [place], [place] [address] [phone]

## 2016-10-22 ENCOUNTER — Ambulatory Visit: Payer: BC Managed Care – PPO | Admitting: Urology

## 2016-10-22 ENCOUNTER — Encounter: Payer: Self-pay | Admitting: Urology

## 2016-10-22 VITALS — BP 137/83 | HR 92 | Ht 60.0 in | Wt 211.1 lb

## 2016-10-22 DIAGNOSIS — R3129 Other microscopic hematuria: Secondary | ICD-10-CM

## 2016-10-22 LAB — MICROSCOPIC EXAMINATION
EPITHELIAL CELLS (NON RENAL): NONE SEEN /HPF (ref 0–10)
RBC, UA: >30 /hpf — ABNORMAL HIGH (ref 0–?)

## 2016-10-22 LAB — URINALYSIS, COMPLETE
BILIRUBIN UA: NEGATIVE
Glucose, UA: NEGATIVE
Nitrite, UA: NEGATIVE
PH UA: 5.5 (ref 5.0–7.5)
Specific Gravity, UA: >1.03 — ABNORMAL HIGH (ref 1.005–1.030)
UUROB: 0.2 mg/dL (ref 0.2–1.0)

## 2016-10-23 LAB — BUN+CREAT
BUN / CREAT RATIO: 30 — AB (ref 9–23)
BUN: 21 mg/dL (ref 6–24)
Creatinine, Ser: 0.7 mg/dL (ref 0.57–1.00)
GFR calc Af Amer: 110 mL/min/{1.73_m2} (ref >59)
GFR, EST NON AFRICAN AMERICAN: 96 mL/min/{1.73_m2} (ref >59)

## 2016-10-27 LAB — CULTURE, URINE COMPREHENSIVE

## 2016-11-05 ENCOUNTER — Ambulatory Visit
Admission: RE | Admit: 2016-11-05 | Discharge: 2016-11-05 | Disposition: A | Discharge status: Home / Self Care | Payer: BC Managed Care – PPO | Source: Ambulatory Visit | Attending: Urology | Admitting: Urology

## 2016-11-05 DIAGNOSIS — M47816 Spondylosis without myelopathy or radiculopathy, lumbar region: Secondary | ICD-10-CM | POA: Diagnosis not present

## 2016-11-05 DIAGNOSIS — M5136 Other intervertebral disc degeneration, lumbar region: Secondary | ICD-10-CM | POA: Insufficient documentation

## 2016-11-05 DIAGNOSIS — K5792 Diverticulitis of intestine, part unspecified, without perforation or abscess without bleeding: Secondary | ICD-10-CM | POA: Insufficient documentation

## 2016-11-05 DIAGNOSIS — I2510 Atherosclerotic heart disease of native coronary artery without angina pectoris: Secondary | ICD-10-CM | POA: Diagnosis not present

## 2016-11-05 DIAGNOSIS — K5730 Diverticulosis of large intestine without perforation or abscess without bleeding: Secondary | ICD-10-CM | POA: Insufficient documentation

## 2016-11-05 DIAGNOSIS — R3129 Other microscopic hematuria: Secondary | ICD-10-CM | POA: Diagnosis present

## 2016-11-05 DIAGNOSIS — I2584 Coronary atherosclerosis due to calcified coronary lesion: Secondary | ICD-10-CM | POA: Insufficient documentation

## 2016-11-05 DIAGNOSIS — D259 Leiomyoma of uterus, unspecified: Secondary | ICD-10-CM | POA: Diagnosis not present

## 2016-11-05 DIAGNOSIS — N200 Calculus of kidney: Secondary | ICD-10-CM | POA: Diagnosis not present

## 2016-11-05 DIAGNOSIS — I700 Atherosclerosis of aorta: Secondary | ICD-10-CM | POA: Diagnosis not present

## 2016-11-05 MED ORDER — IOPAMIDOL (ISOVUE-300) INJECTION 61%
125.0000 mL | Freq: Once | INTRAVENOUS | Status: AC | PRN
  Administered 2016-11-05: 125 mL via INTRAVENOUS

## 2016-11-12 ENCOUNTER — Encounter: Payer: Self-pay | Admitting: Urology

## 2016-11-12 ENCOUNTER — Ambulatory Visit (INDEPENDENT_AMBULATORY_CARE_PROVIDER_SITE_OTHER): Payer: BC Managed Care – PPO | Admitting: Urology

## 2016-11-12 VITALS — BP 137/84 | HR 105 | Ht 60.0 in | Wt 211.7 lb

## 2016-11-12 DIAGNOSIS — N200 Calculus of kidney: Secondary | ICD-10-CM

## 2016-11-12 DIAGNOSIS — R3129 Other microscopic hematuria: Secondary | ICD-10-CM

## 2016-11-12 LAB — URINALYSIS, COMPLETE
BILIRUBIN UA: NEGATIVE
Glucose, UA: NEGATIVE
KETONES UA: NEGATIVE
Nitrite, UA: NEGATIVE
SPEC GRAV UA: 1.015 (ref 1.005–1.030)
Urobilinogen, Ur: 0.2 mg/dL (ref 0.2–1.0)
pH, UA: 6.5 (ref 5.0–7.5)

## 2016-11-12 LAB — MICROSCOPIC EXAMINATION

## 2016-11-12 MED ORDER — CIPROFLOXACIN HCL 500 MG PO TABS
500.0000 mg | ORAL_TABLET | Freq: Once | ORAL | Status: AC
  Administered 2016-11-12: 500 mg via ORAL

## 2016-11-12 MED ORDER — LIDOCAINE HCL 2 % EX GEL
1.0000 "application " | Freq: Once | CUTANEOUS | Status: AC
  Administered 2016-11-12: 1 via URETHRAL

## 2016-11-12 NOTE — Progress Notes (Signed)
   11/12/16  CC: No chief complaint on file.   HPI: The patient is a 56 year old female with a past medical history of nephrolithiasis requiring PCNL for a large left renal stone who presents today for follow-up of her microscopic hematuria workup. On review of her CT, she has a 2 cm mid to lower pole 1200 Hounsfield unit stone. She also has a 5 mm right nonobstructing stone.  There were no vitals taken for this visit. NED. A&Ox3.   No respiratory distress   Abd soft, NT, ND Normal external genitalia with patent urethral meatus  Cystoscopy Procedure Note  Patient identification was confirmed, informed consent was obtained, and patient was prepped using Betadine solution.  Lidocaine jelly was administered per urethral meatus.    Preoperative abx where received prior to procedure.    Procedure: - Flexible cystoscope introduced, without any difficulty.   - Thorough search of the bladder revealed:    normal urethral meatus    normal urothelium    no stones    no ulcers     no tumors    no urethral polyps    no trabeculation  - Ureteral orifices were normal in position and appearance.  Post-Procedure: - Patient tolerated the procedure well  Assessment/ Plan:  1. 2 cm left renal stone I discussed treatment options for the stone with the patient. We specifically released talked about a left PCNL which he has been through before. We discussed how this procedure was done. We discussed the risks, benefits, indications of this procedure. She understands the risks include but are not limited to bleeding, infection, loss of kidney, hospitalization, need for repeat procedures. She also understands the need for nephrostomy tube, stent and catheter after procedure. She will be admitted to the hospital for at least 1 night. All questions were answered. The patient has agreed to proceed.  2. 5 mm right lower pole stone -will address after above  3. Microscopic hematuria -negative workup  except for above.  Nickie Retort, MD

## 2016-11-12 NOTE — Addendum Note (Signed)
Addended by: Kerry Hough on: 11/12/2016 10:02 AM   Modules accepted: Orders

## 2016-11-14 ENCOUNTER — Other Ambulatory Visit: Payer: Self-pay | Admitting: Radiology

## 2016-11-14 DIAGNOSIS — N200 Calculus of kidney: Secondary | ICD-10-CM

## 2016-11-14 LAB — URINE CULTURE

## 2016-12-12 ENCOUNTER — Telehealth: Payer: Self-pay

## 2016-12-12 NOTE — Telephone Encounter (Signed)
Left patient mess to call in regards to surgery scheduled for 12-26-16 she will need to arrive at 6:30am that morning and and will have a pre-op phone interview on 12-19-16.  A reminder with info will be mailed

## 2016-12-19 ENCOUNTER — Encounter
Admission: RE | Admit: 2016-12-19 | Discharge: 2016-12-19 | Disposition: A | Discharge status: Home / Self Care | Payer: BC Managed Care – PPO | Source: Ambulatory Visit | Attending: Urology | Admitting: Urology

## 2016-12-19 ENCOUNTER — Other Ambulatory Visit: Payer: Self-pay

## 2016-12-19 HISTORY — DX: Family history of other specified conditions: Z84.89

## 2016-12-19 HISTORY — DX: Personal history of urinary calculi: Z87.442

## 2016-12-19 HISTORY — DX: Anemia, unspecified: D64.9

## 2016-12-19 HISTORY — DX: Gastro-esophageal reflux disease without esophagitis: K21.9

## 2016-12-19 NOTE — Patient Instructions (Signed)
  Your procedure is scheduled on: 12-26-16 FRIDAY Report to Same Day Surgery 2nd floor medical mall [facility] Entrance-take elevator on left to 2nd floor.  Check in with surgery information desk.) To find out your arrival time please call [phone] between 1PM - 3PM on 12-25-16 THURSDAY  Remember: Instructions that are not followed completely may result in serious medical risk, up to and including death, or upon the discretion of your surgeon and anesthesiologist your surgery may need to be rescheduled.    _x___ 1. Do not eat food after midnight the night before your procedure. NO GUM CHEWING OR CANDY AFTER MIDNIGHT.  You may drink WATER up to 2 hours before you are scheduled to arrive at the hospital for your procedure.  Do not drink WATER within 2 hours of your scheduled arrival to the hospital.  Type 1 and type 2 diabetics should only drink water.    __x__ 2. No Alcohol for 24 hours before or after surgery.   __x__3. No Smoking for 24 prior to surgery.   ____  4. Bring all medications with you on the day of surgery if instructed.    __x__ 5. Notify your doctor if there is any change in your medical condition     (cold, fever, infections).     Do not wear jewelry, make-up, hairpins, clips or nail polish.  Do not wear lotions, powders, or perfumes. You may wear deodorant.  Do not shave 48 hours prior to surgery. Men may shave face and neck.  Do not bring valuables to the hospital.    [facility] is not responsible for any belongings or valuables.               Contacts, dentures or bridgework may not be worn into surgery.  Leave your suitcase in the car. After surgery it may be brought to your room.  For patients admitted to the hospital, discharge time is determined by your treatment team.   Patients discharged the day of surgery will not be allowed to drive home.  You will need someone to drive you home and stay with you the night of your procedure.    Please read over the  following fact sheets that you were given:   [facility] Preparing for Surgery and or MRSA Information   _x___ TAKE THE FOLLOWING MEDICATION THE MORNING OF SURGERY WITH A SMALL SIP OF WATER. These include:  1. LIPITOR (ATORVASTATIN)  2.  3.  4.  5.  6.  ____Fleets enema or Magnesium Citrate as directed.   _x___ Use CHG Soap or sage wipes as directed on instruction sheet   ____ Use inhalers on the day of surgery and bring to hospital day of surgery  _X___ Stop Metformin 2 days prior to surgery-LAST DOSE OF METFORMIN ON Tuesday, November 13TH    ____ Take 1/2 of usual insulin dose the night before surgery and none on the morning surgery.   ____ Follow recommendations from Cardiologist, Pulmonologist or PCP regarding stopping Aspirin, Coumadin, Plavix ,Eliquis, Effient, or Pradaxa, and Pletal.  X____Stop Anti-inflammatories such as Advil, Aleve, Ibuprofen, Motrin, Naproxen, Naprosyn, Goodies powders or aspirin products NOW-OK to take Tylenol    _x___ Stop supplements until after surgery-STOP GLUCOSAMINE-MSM NOW-MAY RESUME AFTER SURGERY   ____ Bring C-Pap to the hospital.

## 2016-12-22 ENCOUNTER — Other Ambulatory Visit: Payer: Self-pay

## 2016-12-22 ENCOUNTER — Encounter
Admission: RE | Admit: 2016-12-22 | Discharge: 2016-12-22 | Disposition: A | Discharge status: Home / Self Care | Payer: BC Managed Care – PPO | Source: Ambulatory Visit | Attending: Urology | Admitting: Urology

## 2016-12-22 DIAGNOSIS — N200 Calculus of kidney: Secondary | ICD-10-CM | POA: Insufficient documentation

## 2016-12-22 DIAGNOSIS — I10 Essential (primary) hypertension: Secondary | ICD-10-CM | POA: Insufficient documentation

## 2016-12-22 DIAGNOSIS — Z01818 Encounter for other preprocedural examination: Secondary | ICD-10-CM | POA: Diagnosis not present

## 2016-12-22 LAB — TYPE AND SCREEN
ABO/RH(D): O POS
Antibody Screen: NEGATIVE

## 2016-12-22 LAB — DIFFERENTIAL
BASOS ABS: 0.1 10*3/uL (ref 0–0.1)
BASOS PCT: 1 %
EOS ABS: 0.1 10*3/uL (ref 0–0.7)
Eosinophils Relative: 2 %
Lymphocytes Relative: 31 %
Lymphs Abs: 2.3 10*3/uL (ref 1.0–3.6)
MONO ABS: 0.4 10*3/uL (ref 0.2–0.9)
MONOS PCT: 6 %
Neutro Abs: 4.4 10*3/uL (ref 1.4–6.5)
Neutrophils Relative %: 60 %

## 2016-12-22 LAB — CBC
HEMATOCRIT: 42.3 % (ref 35.0–47.0)
Hemoglobin: 13.8 g/dL (ref 12.0–16.0)
MCH: 27.6 pg (ref 26.0–34.0)
MCHC: 32.7 g/dL (ref 32.0–36.0)
MCV: 84.2 fL (ref 80.0–100.0)
PLATELETS: 252 10*3/uL (ref 150–440)
RBC: 5.03 MIL/uL (ref 3.80–5.20)
RDW: 14.4 % (ref 11.5–14.5)
WBC: 7.4 10*3/uL (ref 3.6–11.0)

## 2016-12-22 LAB — BASIC METABOLIC PANEL
Anion gap: 12 (ref 5–15)
BUN: 22 mg/dL — AB (ref 6–20)
CO2: 22 mmol/L (ref 22–32)
CREATININE: 0.7 mg/dL (ref 0.44–1.00)
Calcium: 9.3 mg/dL (ref 8.9–10.3)
Chloride: 105 mmol/L (ref 101–111)
GFR calc Af Amer: >60 mL/min (ref >60)
GLUCOSE: 131 mg/dL — AB (ref 65–99)
POTASSIUM: 3.8 mmol/L (ref 3.5–5.1)
Sodium: 139 mmol/L (ref 135–145)

## 2016-12-22 LAB — PROTIME-INR
INR: 0.92
Prothrombin Time: 12.3 seconds (ref 11.4–15.2)

## 2016-12-22 LAB — APTT: APTT: 29 s (ref 24–36)

## 2016-12-22 MED ORDER — CEFAZOLIN SODIUM-DEXTROSE 2-4 GM/100ML-% IV SOLN: 2.0000 g | Freq: Once | INTRAVENOUS | Status: DC

## 2016-12-25 ENCOUNTER — Other Ambulatory Visit: Payer: Self-pay | Admitting: Radiology

## 2016-12-25 ENCOUNTER — Encounter: Payer: Self-pay | Admitting: *Deleted

## 2016-12-25 ENCOUNTER — Telehealth: Payer: Self-pay | Admitting: Radiology

## 2016-12-25 NOTE — Telephone Encounter (Signed)
Discussed with pt that unfortunately there is not a recent ucx prior to surgery scheduled 12/26/16 and therefore surgery will have to be postponed. Pt became very upset stating that first, surgery wasn't scheduled on time then now a urine culture wasn't done. Pt will call back to confirm whether she wants to reschedule surgery or make other plans.

## 2016-12-26 ENCOUNTER — Other Ambulatory Visit: Payer: Self-pay | Admitting: Radiology

## 2016-12-26 ENCOUNTER — Ambulatory Visit: Payer: BC Managed Care – PPO

## 2016-12-26 ENCOUNTER — Ambulatory Visit: Admission: RE | Admit: 2016-12-26 | Payer: BC Managed Care – PPO | Source: Ambulatory Visit

## 2016-12-29 ENCOUNTER — Other Ambulatory Visit: Payer: Self-pay | Admitting: Radiology

## 2016-12-29 DIAGNOSIS — N200 Calculus of kidney: Secondary | ICD-10-CM

## 2016-12-30 ENCOUNTER — Other Ambulatory Visit: Payer: Self-pay | Admitting: Radiology

## 2017-01-09 ENCOUNTER — Encounter
Admission: RE | Admit: 2017-01-09 | Discharge: 2017-01-09 | Disposition: A | Discharge status: Home / Self Care | Payer: BC Managed Care – PPO | Source: Ambulatory Visit | Attending: Urology | Admitting: Urology

## 2017-01-09 DIAGNOSIS — N200 Calculus of kidney: Secondary | ICD-10-CM | POA: Insufficient documentation

## 2017-01-09 DIAGNOSIS — Z01812 Encounter for preprocedural laboratory examination: Secondary | ICD-10-CM | POA: Insufficient documentation

## 2017-01-09 LAB — URINALYSIS, ROUTINE W REFLEX MICROSCOPIC
BILIRUBIN URINE: NEGATIVE
Bacteria, UA: NONE SEEN
GLUCOSE, UA: NEGATIVE mg/dL
HGB URINE DIPSTICK: NEGATIVE
Ketones, ur: NEGATIVE mg/dL
Nitrite: NEGATIVE
PH: 6 (ref 5.0–8.0)
Protein, ur: 30 mg/dL — AB
SPECIFIC GRAVITY, URINE: 1.019 (ref 1.005–1.030)
Squamous Epithelial / LPF: NONE SEEN

## 2017-01-09 LAB — TYPE AND SCREEN
ABO/RH(D): O POS
Antibody Screen: NEGATIVE

## 2017-01-11 LAB — URINE CULTURE

## 2017-01-16 ENCOUNTER — Ambulatory Visit: Payer: BC Managed Care – PPO

## 2017-01-17 ENCOUNTER — Other Ambulatory Visit: Payer: Self-pay | Admitting: Internal Medicine

## 2017-01-22 MED ORDER — CEFAZOLIN SODIUM-DEXTROSE 2-4 GM/100ML-% IV SOLN
2.0000 g | INTRAVENOUS | Status: AC
  Administered 2017-01-23: 2 g via INTRAVENOUS

## 2017-01-23 ENCOUNTER — Other Ambulatory Visit: Payer: Self-pay

## 2017-01-23 ENCOUNTER — Telehealth: Payer: Self-pay | Admitting: Urology

## 2017-01-23 ENCOUNTER — Ambulatory Visit: Payer: BC Managed Care – PPO | Admitting: Anesthesiology

## 2017-01-23 ENCOUNTER — Ambulatory Visit: Payer: BC Managed Care – PPO

## 2017-01-23 ENCOUNTER — Encounter
Admission: RE | Disposition: A | Discharge status: Home / Self Care | Payer: Self-pay | Source: Ambulatory Visit | Attending: Urology

## 2017-01-23 ENCOUNTER — Encounter: Payer: Self-pay | Admitting: *Deleted

## 2017-01-23 ENCOUNTER — Observation Stay
Admission: RE | Admit: 2017-01-23 | Discharge: 2017-01-24 | Disposition: A | Discharge status: Home / Self Care | Payer: BC Managed Care – PPO | Source: Ambulatory Visit | Attending: Urology | Admitting: Urology

## 2017-01-23 DIAGNOSIS — E890 Postprocedural hypothyroidism: Secondary | ICD-10-CM | POA: Insufficient documentation

## 2017-01-23 DIAGNOSIS — E785 Hyperlipidemia, unspecified: Secondary | ICD-10-CM | POA: Insufficient documentation

## 2017-01-23 DIAGNOSIS — E119 Type 2 diabetes mellitus without complications: Secondary | ICD-10-CM | POA: Insufficient documentation

## 2017-01-23 DIAGNOSIS — Z833 Family history of diabetes mellitus: Secondary | ICD-10-CM

## 2017-01-23 DIAGNOSIS — Z91048 Other nonmedicinal substance allergy status: Secondary | ICD-10-CM

## 2017-01-23 DIAGNOSIS — D649 Anemia, unspecified: Secondary | ICD-10-CM

## 2017-01-23 DIAGNOSIS — I2510 Atherosclerotic heart disease of native coronary artery without angina pectoris: Secondary | ICD-10-CM | POA: Insufficient documentation

## 2017-01-23 DIAGNOSIS — I10 Essential (primary) hypertension: Secondary | ICD-10-CM | POA: Insufficient documentation

## 2017-01-23 DIAGNOSIS — K219 Gastro-esophageal reflux disease without esophagitis: Secondary | ICD-10-CM | POA: Insufficient documentation

## 2017-01-23 DIAGNOSIS — Z91040 Latex allergy status: Secondary | ICD-10-CM | POA: Diagnosis not present

## 2017-01-23 DIAGNOSIS — N200 Calculus of kidney: Secondary | ICD-10-CM | POA: Insufficient documentation

## 2017-01-23 DIAGNOSIS — Z803 Family history of malignant neoplasm of breast: Secondary | ICD-10-CM | POA: Insufficient documentation

## 2017-01-23 DIAGNOSIS — Z87442 Personal history of urinary calculi: Secondary | ICD-10-CM | POA: Insufficient documentation

## 2017-01-23 DIAGNOSIS — Z8249 Family history of ischemic heart disease and other diseases of the circulatory system: Secondary | ICD-10-CM

## 2017-01-23 DIAGNOSIS — Z79899 Other long term (current) drug therapy: Secondary | ICD-10-CM | POA: Insufficient documentation

## 2017-01-23 DIAGNOSIS — Z888 Allergy status to other drugs, medicaments and biological substances status: Secondary | ICD-10-CM

## 2017-01-23 DIAGNOSIS — R319 Hematuria, unspecified: Secondary | ICD-10-CM

## 2017-01-23 DIAGNOSIS — K5730 Diverticulosis of large intestine without perforation or abscess without bleeding: Secondary | ICD-10-CM | POA: Insufficient documentation

## 2017-01-23 DIAGNOSIS — D259 Leiomyoma of uterus, unspecified: Secondary | ICD-10-CM | POA: Diagnosis not present

## 2017-01-23 DIAGNOSIS — Z801 Family history of malignant neoplasm of trachea, bronchus and lung: Secondary | ICD-10-CM | POA: Insufficient documentation

## 2017-01-23 DIAGNOSIS — Z7984 Long term (current) use of oral hypoglycemic drugs: Secondary | ICD-10-CM

## 2017-01-23 DIAGNOSIS — Z9889 Other specified postprocedural states: Secondary | ICD-10-CM | POA: Insufficient documentation

## 2017-01-23 HISTORY — PX: IR NEPHROSTOMY PLACEMENT LEFT: IMG6063

## 2017-01-23 HISTORY — PX: NEPHROLITHOTOMY: SHX5134

## 2017-01-23 LAB — BASIC METABOLIC PANEL
Anion gap: 6 (ref 5–15)
BUN: 16 mg/dL (ref 6–20)
CALCIUM: 9.1 mg/dL (ref 8.9–10.3)
CO2: 25 mmol/L (ref 22–32)
CREATININE: 0.56 mg/dL (ref 0.44–1.00)
Chloride: 105 mmol/L (ref 101–111)
GFR calc non Af Amer: >60 mL/min (ref >60)
Glucose, Bld: 151 mg/dL — ABNORMAL HIGH (ref 65–99)
Potassium: 3.9 mmol/L (ref 3.5–5.1)
Sodium: 136 mmol/L (ref 135–145)

## 2017-01-23 LAB — CBC
HCT: 40.7 % (ref 35.0–47.0)
Hemoglobin: 13.4 g/dL (ref 12.0–16.0)
MCH: 27.9 pg (ref 26.0–34.0)
MCHC: 33 g/dL (ref 32.0–36.0)
MCV: 84.6 fL (ref 80.0–100.0)
PLATELETS: 263 10*3/uL (ref 150–440)
RBC: 4.81 MIL/uL (ref 3.80–5.20)
RDW: 14.3 % (ref 11.5–14.5)
WBC: 7.2 10*3/uL (ref 3.6–11.0)

## 2017-01-23 LAB — PROTIME-INR
INR: 0.91
PROTHROMBIN TIME: 12.2 s (ref 11.4–15.2)

## 2017-01-23 LAB — GLUCOSE, CAPILLARY
GLUCOSE-CAPILLARY: 155 mg/dL — AB (ref 65–99)
GLUCOSE-CAPILLARY: 171 mg/dL — AB (ref 65–99)
Glucose-Capillary: 179 mg/dL — ABNORMAL HIGH (ref 65–99)

## 2017-01-23 LAB — APTT: aPTT: 28 seconds (ref 24–36)

## 2017-01-23 SURGERY — NEPHROLITHOTOMY PERCUTANEOUS
Anesthesia: General | Laterality: Left | Wound class: Clean Contaminated

## 2017-01-23 MED ORDER — ACETAMINOPHEN 10 MG/ML IV SOLN
INTRAVENOUS | Status: DC | PRN
  Administered 2017-01-23: 1000 mg via INTRAVENOUS

## 2017-01-23 MED ORDER — ROCURONIUM BROMIDE 50 MG/5ML IV SOLN
INTRAVENOUS | Status: AC
  Filled 2017-01-23: qty 1

## 2017-01-23 MED ORDER — LOSARTAN POTASSIUM 50 MG PO TABS
50.0000 mg | ORAL_TABLET | Freq: Every day | ORAL | Status: DC
  Administered 2017-01-23: 50 mg via ORAL
  Filled 2017-01-23: qty 1

## 2017-01-23 MED ORDER — CIPROFLOXACIN IN D5W 400 MG/200ML IV SOLN
400.0000 mg | INTRAVENOUS | Status: AC
  Administered 2017-01-23: 400 mg via INTRAVENOUS

## 2017-01-23 MED ORDER — MORPHINE SULFATE (PF) 2 MG/ML IV SOLN: 2.0000 mg | INTRAVENOUS | Status: DC | PRN

## 2017-01-23 MED ORDER — SUGAMMADEX SODIUM 200 MG/2ML IV SOLN
INTRAVENOUS | Status: AC
  Filled 2017-01-23: qty 2

## 2017-01-23 MED ORDER — MIDAZOLAM HCL 5 MG/5ML IJ SOLN
INTRAMUSCULAR | Status: AC | PRN
  Administered 2017-01-23 (×3): 1 mg via INTRAVENOUS

## 2017-01-23 MED ORDER — OXYCODONE-ACETAMINOPHEN 5-325 MG PO TABS
1.0000 | ORAL_TABLET | ORAL | Status: DC | PRN
  Administered 2017-01-23 (×2): 1 via ORAL
  Filled 2017-01-23 (×3): qty 1

## 2017-01-23 MED ORDER — EPHEDRINE SULFATE 50 MG/ML IJ SOLN
INTRAMUSCULAR | Status: DC | PRN
  Administered 2017-01-23: 10 mg via INTRAVENOUS

## 2017-01-23 MED ORDER — PROPOFOL 10 MG/ML IV BOLUS
INTRAVENOUS | Status: DC | PRN
  Administered 2017-01-23: 130 mg via INTRAVENOUS

## 2017-01-23 MED ORDER — ATORVASTATIN CALCIUM 10 MG PO TABS: 10.0000 mg | ORAL_TABLET | Freq: Every day | ORAL | Status: DC

## 2017-01-23 MED ORDER — FENTANYL CITRATE (PF) 100 MCG/2ML IJ SOLN
25.0000 ug | INTRAMUSCULAR | Status: DC | PRN
  Administered 2017-01-23 (×5): 25 ug via INTRAVENOUS

## 2017-01-23 MED ORDER — OXYBUTYNIN CHLORIDE 5 MG PO TABS: ORAL_TABLET | ORAL | 0 refills | Status: DC

## 2017-01-23 MED ORDER — FENTANYL CITRATE (PF) 100 MCG/2ML IJ SOLN
INTRAMUSCULAR | Status: AC | PRN
  Administered 2017-01-23: 25 ug via INTRAVENOUS
  Administered 2017-01-23: 50 ug via INTRAVENOUS
  Administered 2017-01-23 (×2): 25 ug via INTRAVENOUS

## 2017-01-23 MED ORDER — FAMOTIDINE 20 MG PO TABS
20.0000 mg | ORAL_TABLET | Freq: Once | ORAL | Status: AC
  Administered 2017-01-23: 20 mg via ORAL

## 2017-01-23 MED ORDER — ACETAMINOPHEN NICU IV SYRINGE 10 MG/ML
INTRAVENOUS | Status: AC
  Filled 2017-01-23: qty 1

## 2017-01-23 MED ORDER — CEFAZOLIN SODIUM-DEXTROSE 2-4 GM/100ML-% IV SOLN
INTRAVENOUS | Status: AC
  Filled 2017-01-23: qty 100

## 2017-01-23 MED ORDER — FAMOTIDINE 20 MG PO TABS
ORAL_TABLET | ORAL | Status: AC
  Administered 2017-01-23: 20 mg via ORAL
  Filled 2017-01-23: qty 1

## 2017-01-23 MED ORDER — ONDANSETRON HCL 4 MG/2ML IJ SOLN
INTRAMUSCULAR | Status: DC | PRN
  Administered 2017-01-23: 4 mg via INTRAVENOUS

## 2017-01-23 MED ORDER — GLUCOSE BLOOD VI STRP: 1.0000 | ORAL_STRIP | Freq: Three times a day (TID) | Status: DC

## 2017-01-23 MED ORDER — MIDAZOLAM HCL 2 MG/2ML IJ SOLN
INTRAMUSCULAR | Status: DC | PRN
  Administered 2017-01-23: 2 mg via INTRAVENOUS

## 2017-01-23 MED ORDER — FENTANYL CITRATE (PF) 100 MCG/2ML IJ SOLN
INTRAMUSCULAR | Status: AC
  Filled 2017-01-23: qty 4

## 2017-01-23 MED ORDER — IOTHALAMATE MEGLUMINE 43 % IV SOLN
INTRAVENOUS | Status: DC | PRN
  Administered 2017-01-23: 40 mL

## 2017-01-23 MED ORDER — SUGAMMADEX SODIUM 200 MG/2ML IV SOLN
INTRAVENOUS | Status: DC | PRN
  Administered 2017-01-23: 200 mg via INTRAVENOUS

## 2017-01-23 MED ORDER — MIDAZOLAM HCL 2 MG/2ML IJ SOLN
INTRAMUSCULAR | Status: AC
  Filled 2017-01-23: qty 2

## 2017-01-23 MED ORDER — LIDOCAINE HCL (PF) 2 % IJ SOLN
INTRAMUSCULAR | Status: AC
  Filled 2017-01-23: qty 10

## 2017-01-23 MED ORDER — SODIUM CHLORIDE 0.9 % IV SOLN
INTRAVENOUS | Status: DC
  Administered 2017-01-23 (×2): via INTRAVENOUS

## 2017-01-23 MED ORDER — SODIUM CHLORIDE 0.9 % IV SOLN: INTRAVENOUS | Status: DC

## 2017-01-23 MED ORDER — PROPOFOL 10 MG/ML IV BOLUS
INTRAVENOUS | Status: AC
  Filled 2017-01-23: qty 20

## 2017-01-23 MED ORDER — ROCURONIUM BROMIDE 100 MG/10ML IV SOLN
INTRAVENOUS | Status: DC | PRN
  Administered 2017-01-23: 20 mg via INTRAVENOUS
  Administered 2017-01-23: 10 mg via INTRAVENOUS
  Administered 2017-01-23: 50 mg via INTRAVENOUS

## 2017-01-23 MED ORDER — LIDOCAINE HCL (PF) 1 % IJ SOLN
INTRAMUSCULAR | Status: AC
Start: 2017-01-23 — End: ?
  Filled 2017-01-23: qty 30

## 2017-01-23 MED ORDER — FENTANYL CITRATE (PF) 100 MCG/2ML IJ SOLN
INTRAMUSCULAR | Status: AC
  Filled 2017-01-23: qty 2

## 2017-01-23 MED ORDER — FENTANYL CITRATE (PF) 100 MCG/2ML IJ SOLN
INTRAMUSCULAR | Status: DC | PRN
  Administered 2017-01-23 (×4): 50 ug via INTRAVENOUS

## 2017-01-23 MED ORDER — IOPAMIDOL (ISOVUE-300) INJECTION 61%
30.0000 mL | Freq: Once | INTRAVENOUS | Status: AC | PRN
  Administered 2017-01-23: 09:00:00 40 mL

## 2017-01-23 MED ORDER — LIDOCAINE HCL (PF) 1 % IJ SOLN
30.0000 mL | Freq: Once | INTRAMUSCULAR | Status: DC
  Filled 2017-01-23: qty 30

## 2017-01-23 MED ORDER — FENTANYL CITRATE (PF) 100 MCG/2ML IJ SOLN
INTRAMUSCULAR | Status: AC
  Administered 2017-01-23: 25 ug via INTRAVENOUS
  Filled 2017-01-23: qty 2

## 2017-01-23 MED ORDER — METFORMIN HCL 500 MG PO TABS
500.0000 mg | ORAL_TABLET | Freq: Two times a day (BID) | ORAL | Status: DC
  Administered 2017-01-23 – 2017-01-24 (×2): 500 mg via ORAL
  Filled 2017-01-23 (×2): qty 1

## 2017-01-23 MED ORDER — ONDANSETRON HCL 4 MG/2ML IJ SOLN: 4.0000 mg | Freq: Once | INTRAMUSCULAR | Status: DC | PRN

## 2017-01-23 MED ORDER — DEXAMETHASONE SODIUM PHOSPHATE 10 MG/ML IJ SOLN
INTRAMUSCULAR | Status: AC
  Filled 2017-01-23: qty 1

## 2017-01-23 MED ORDER — PHENYLEPHRINE HCL 10 MG/ML IJ SOLN
INTRAMUSCULAR | Status: DC | PRN
  Administered 2017-01-23: 200 ug via INTRAVENOUS
  Administered 2017-01-23: 100 ug via INTRAVENOUS
  Administered 2017-01-23 (×6): 200 ug via INTRAVENOUS

## 2017-01-23 MED ORDER — CEFAZOLIN SODIUM-DEXTROSE 2-4 GM/100ML-% IV SOLN: 2.0000 g | Freq: Three times a day (TID) | INTRAVENOUS | Status: DC

## 2017-01-23 MED ORDER — DEXAMETHASONE SODIUM PHOSPHATE 10 MG/ML IJ SOLN
INTRAMUSCULAR | Status: DC | PRN
  Administered 2017-01-23: 10 mg via INTRAVENOUS

## 2017-01-23 MED ORDER — ONDANSETRON HCL 4 MG/2ML IJ SOLN
INTRAMUSCULAR | Status: AC
  Filled 2017-01-23: qty 2

## 2017-01-23 MED ORDER — ONDANSETRON HCL 4 MG/2ML IJ SOLN
4.0000 mg | Freq: Three times a day (TID) | INTRAMUSCULAR | Status: DC | PRN
  Administered 2017-01-23: 4 mg via INTRAVENOUS
  Filled 2017-01-23: qty 2

## 2017-01-23 MED ORDER — OXYCODONE-ACETAMINOPHEN 5-325 MG PO TABS
1.0000 | ORAL_TABLET | ORAL | 0 refills | Status: DC | PRN
Start: 2017-01-23 — End: 2017-01-30

## 2017-01-23 MED ORDER — EPHEDRINE SULFATE 50 MG/ML IJ SOLN
INTRAMUSCULAR | Status: AC
  Filled 2017-01-23: qty 1

## 2017-01-23 MED ORDER — SODIUM CHLORIDE 0.9 % IV SOLN
INTRAVENOUS | Status: DC
  Administered 2017-01-23: 10:00:00 via INTRAVENOUS

## 2017-01-23 MED ORDER — LIDOCAINE HCL (CARDIAC) 20 MG/ML IV SOLN
INTRAVENOUS | Status: DC | PRN
  Administered 2017-01-23: 80 mg via INTRAVENOUS

## 2017-01-23 MED ORDER — LIDOCAINE HCL (PF) 1 % IJ SOLN
INTRAMUSCULAR | Status: AC | PRN
  Administered 2017-01-23: 10 mL

## 2017-01-23 MED ORDER — MIDAZOLAM HCL 5 MG/5ML IJ SOLN
INTRAMUSCULAR | Status: AC
  Filled 2017-01-23: qty 5

## 2017-01-23 MED ORDER — DEXTROSE 5 % IV SOLN
2.0000 g | Freq: Three times a day (TID) | INTRAVENOUS | Status: AC
  Administered 2017-01-23 – 2017-01-24 (×2): 2 g via INTRAVENOUS
  Filled 2017-01-23 (×2): qty 2000

## 2017-01-23 MED ORDER — CIPROFLOXACIN IN D5W 400 MG/200ML IV SOLN
INTRAVENOUS | Status: AC
  Filled 2017-01-23: qty 200

## 2017-01-23 SURGICAL SUPPLY — 68 items
ADAPTER IRRIG TUBE 2 SPIKE SOL (ADAPTER) ×6 IMPLANT
ADAPTER SCOPE UROLOK II (MISCELLANEOUS) ×3 IMPLANT
BAG URINE DRAINAGE (UROLOGICAL SUPPLIES) ×3 IMPLANT
BALLN NEPHROSTOMY 10MMX15CM (UROLOGICAL SUPPLIES) ×1
BALLN NEPHROSTOMY 10X15 (UROLOGICAL SUPPLIES) ×2 IMPLANT
BASKET ZERO TIP 1.9FR (BASKET) IMPLANT
BLADE SURG 15 STRL LF DISP TIS (BLADE) ×1 IMPLANT
BLADE SURG 15 STRL SS (BLADE) ×2
CATH COUNCIL 22FR (CATHETERS) ×3 IMPLANT
CATH FOLEY 2W COUNCIL 20FR 5CC (CATHETERS) ×3 IMPLANT
CATH FOLEY 2W COUNCIL 5CC 18FR (CATHETERS) IMPLANT
CATH STENT KAYE NEPHR TAMP (CATHETERS) IMPLANT
CATH URETL 5X70 OPEN END (CATHETERS) ×3 IMPLANT
CATH/STENT KAYE NEPHR TAMP (CATHETERS)
CHLORAPREP W/TINT 26ML (MISCELLANEOUS) ×3 IMPLANT
CNTNR SPEC 2.5X3XGRAD LEK (MISCELLANEOUS) ×1
CONRAY 43 FOR UROLOGY 50M (MISCELLANEOUS) ×9 IMPLANT
CONT SPEC 4OZ STER OR WHT (MISCELLANEOUS) ×2
CONTAINER SPEC 2.5X3XGRAD LEK (MISCELLANEOUS) ×1 IMPLANT
DRAPE C-ARM XRAY 36X54 (DRAPES) ×3 IMPLANT
DRAPE SHEET LG 3/4 BI-LAMINATE (DRAPES) ×3 IMPLANT
DRAPE STERI 35X30 U-POUCH (DRAPES) ×3 IMPLANT
DRAPE SURG 17X11 SM STRL (DRAPES) ×12 IMPLANT
GAUZE SPONGE 4X4 12PLY STRL (GAUZE/BANDAGES/DRESSINGS) ×3 IMPLANT
GLIDEWIRE STIFF .35X180X3 HYDR (WIRE) ×3 IMPLANT
GLOVE BIO SURGEON STRL SZ 6.5 (GLOVE) ×2 IMPLANT
GLOVE BIO SURGEON STRL SZ7 (GLOVE) ×6 IMPLANT
GLOVE BIO SURGEONS STRL SZ 6.5 (GLOVE) ×1
GLOVE BIOGEL PI IND STRL 6.5 (GLOVE) ×2 IMPLANT
GLOVE BIOGEL PI INDICATOR 6.5 (GLOVE) ×4
GOWN STRL REUS W/ TWL LRG LVL3 (GOWN DISPOSABLE) ×2 IMPLANT
GOWN STRL REUS W/TWL LRG LVL3 (GOWN DISPOSABLE) ×4
GUIDEWIRE GREEN .038 145CM (MISCELLANEOUS) ×3 IMPLANT
GUIDEWIRE INTRO SET STRAIGHT (WIRE) ×3 IMPLANT
GUIDEWIRE STR ZIPWIRE 035X150 (MISCELLANEOUS) ×3 IMPLANT
GUIDEWIRE SUPER STIFF .035X180 (WIRE) ×3 IMPLANT
HOLDER FOLEY CATH W/STRAP (MISCELLANEOUS) ×3 IMPLANT
INTRODUCER DILATOR DOUBLE (INTRODUCER) IMPLANT
MANIFOLD NEPTUNE II (INSTRUMENTS) ×3 IMPLANT
MAT BLUE FLOOR 46X72 FLO (MISCELLANEOUS) ×6 IMPLANT
NDL FASCIA INCISION 18GA (NEEDLE) ×3 IMPLANT
PACK BASIN MINOR ARMC (MISCELLANEOUS) ×3 IMPLANT
PAD ABD DERMACEA PRESS 5X9 (GAUZE/BANDAGES/DRESSINGS) ×6 IMPLANT
PAD PREP 24X41 OB/GYN DISP (PERSONAL CARE ITEMS) ×3 IMPLANT
PROBE CYBERWAND SET (MISCELLANEOUS) ×6 IMPLANT
SENSORWIRE 0.038 NOT ANGLED (WIRE) ×3
SET IRRIGATING DISP (SET/KITS/TRAYS/PACK) ×3 IMPLANT
SHEET NEURO XL SOL CTL (MISCELLANEOUS) ×3 IMPLANT
SOL .9 NS 3000ML IRR  AL (IV SOLUTION) ×8
SOL .9 NS 3000ML IRR UROMATIC (IV SOLUTION) ×4 IMPLANT
SPONGE DRAIN TRACH 4X4 STRL 2S (GAUZE/BANDAGES/DRESSINGS) ×3 IMPLANT
STENT URET 6FRX24 CONTOUR (STENTS) IMPLANT
STENT URET 6FRX26 CONTOUR (STENTS) IMPLANT
STRAP SAFETY BODY (MISCELLANEOUS) ×6 IMPLANT
SUT SILK 0 SH 30 (SUTURE) ×3 IMPLANT
SYR 10ML LL (SYRINGE) ×3 IMPLANT
SYR 20CC LL (SYRINGE) ×3 IMPLANT
SYR 30ML LL (SYRINGE) ×3 IMPLANT
SYRINGE IRR TOOMEY STRL 70CC (SYRINGE) ×3 IMPLANT
TAPE CLOTH 10X20 WHT NS LF (TAPE) ×1 IMPLANT
TAPE CLOTH 2X10 WHT NS LF (TAPE) ×2
TAPE MICROFOAM 4IN (TAPE) ×3 IMPLANT
TRAP SPECIMEN MUCOUS 40CC (MISCELLANEOUS) IMPLANT
TRAY FOLEY W/METER SILVER 16FR (SET/KITS/TRAYS/PACK) ×3 IMPLANT
TUBING CONNECTING 10 (TUBING) ×2 IMPLANT
TUBING CONNECTING 10' (TUBING) ×1
WATER STERILE IRR 1000ML POUR (IV SOLUTION) ×3 IMPLANT
WIRE SENSOR 0.038 NOT ANGLED (WIRE) ×1 IMPLANT

## 2017-01-23 NOTE — Interval H&P Note (Signed)
History and Physical Interval Note:  01/23/2017 9:58 AM  Brittany Forbes  has presented today for surgery, with the diagnosis of left renal stone  The various methods of treatment have been discussed with the patient and family. After consideration of risks, benefits and other options for treatment, the patient has consented to  Procedure(s): NEPHROLITHOTOMY PERCUTANEOUS (Left) as a surgical intervention .  The patient's history has been reviewed, patient examined, no change in status, stable for surgery.  I have reviewed the patient's chart and labs.  Questions were answered to the patient's satisfaction.     [place]

## 2017-01-23 NOTE — Procedures (Signed)
Interventional Radiology Procedure Note  Procedure: Left percutaneous renal access with ureteral catheter placement  Complications: None  Estimated Blood Loss: < 10 mL  Findings: Interpolar access of left kidney under [place] to opacify collecting system with 21 G needle.  Separate LP access with 21 G needle. Large, impacted stone in lower infundibulum and pelvis.  Difficult to pass wire around stone and into ureter, but eventually able to pass wire and 5 Fr catheter past stone and down ureter.  Catheter advanced to bladder and capped.  Secured at skin with Prolene retention suture and dressing.  For operative PCNL later this morning.  Venetia Night. Kathlene Cote, M.D Pager:  [phone]

## 2017-01-23 NOTE — Op Note (Signed)
Date of procedure: 01/23/17  Preoperative diagnosis:  1. Left renal pelvic calculus  Postoperative diagnosis:  1. Left renal pelvic calculus  Procedure: 1. Left percutaneous nephrolithotomy  Surgeon: John Giovanni, MD  Anesthesia: General  Complications: None  Intraoperative findings: 2 cm left renal pelvic calculus  EBL: 250 mL  Specimens: Stone fragment for analysis  Drains:  1.  Foley catheter 2.  6 French/24 cm double-J ureteral stent 3.  20 French Councill catheter nephrostomy tube  Indication: Brittany Forbes is a 56 y.o. patient with a 2 cm left renal pelvic calculus and stone density measuring greater than 1000 HU.  She elected to proceed with PCNL.  After reviewing the management options for treatment, he elected to proceed with the above surgical procedure(s). We have discussed the potential benefits and risks of the procedure, side effects of the proposed treatment, the likelihood of the patient achieving the goals of the procedure, and any potential problems that might occur during the procedure or recuperation. Informed consent has been obtained.  A left nephroureteral catheter was placed earlier this morning by interventional radiology with a separate procedure note.  Description of procedure:  The patient was taken to the operating room and general anesthesia was induced.  She was placed on the operating table in the prone position taking care to pad all pressure points. Large jelly rolls were placed in the anterior axillary line on both sides allowing the patient's chest and abdomen to fall in between.  Her nephrostomy bandage was removed and the left flank was prepped and draped in the usual fashion and preoperative antibiotics were administered. A preoperative time-out was performed.    Under fluoroscopic guidance an Amplatz super stiff guidewire was placed through the nephroureteral catheter and advanced into the bladder and the nephroureteral catheter was  removed.  A dual-lumen catheter was then placed over the Amplatz wire to the level of the UPJ and a 0.038 Sensor wire was placed through the second lumen and advanced to the bladder under fluoroscopic guidance and used as a safety wire.  A NephroMax balloon dilating catheter was placed over the Amplatz wire and advanced under fluoroscopic guidance into a lower pole calyx.  The balloon was inflated to 16 atm and kept inflated for 3 minutes.  A 30 French sheath was then advanced over the balloon and the balloon was deflated and removed.  A nephroscope was advanced through the sheath however I could not advance the nephroscope into the collecting system.  I was able to advance a flexible cystoscope alongside the wire to the collecting system and the stone was visualized.  I did ask Dr. Erlene Quan to scrub briefly and she replaced the dilating balloon and advanced it further and reinflated and then readvanced the sheath of the balloon.  She was able to then advance the nephroscope into the collecting system and the renal pelvic calculus was identified.  The stone was fragmented/aspirated with the CyberWand down to a fragment approximately 5 mm in size.  This fragment was then grasped with forceps and removed the sheath and sent for analysis.  The nephroscope was removed and a flexible cystoscope was placed through the sheath into the collecting system.  Retrograde pyelogram was performed which showed no extravasation.  All calyces were examined and no residual stone fragments were identified.  A sensor wire was placed to the cystoscope down to the bladder and the cystoscope was removed.  A 6 French/24 cm double-J ureteral stent was advanced over the sensor wire and  the wire was removed.  A good curl was seen in the bladder under fluoroscopic guidance.  The tip of the catheter was grasped with forceps for a good curl in the renal pelvis.  A 20 [place] [employer] catheter was then placed over the Amplatz wire and inflated  with 2 cc of dilute contrast.  Nephrostogram was performed which showed the catheter in good position.  The Amplatz sheath was removed and no significant bleeding was noted.  The nephrostomy tube was sewn in place with 0 silk suture.  The catheter was placed to gravity drainage.  The safety wire was removed.  The patient was then flipped to a gurney to the spine position and was extubated.  She was transported to the PACU in stable condition.  John Giovanni, M.D.

## 2017-01-23 NOTE — Anesthesia Procedure Notes (Signed)
Procedure Name: Intubation Date/Time: 01/23/2017 10:26 AM Performed by: Doreen Salvage, CRNA Pre-anesthesia Checklist: Patient identified, Patient being monitored, Timeout performed, Emergency Drugs available and Suction available Patient Re-evaluated:Patient Re-evaluated prior to induction Oxygen Delivery Method: Circle system utilized Preoxygenation: Pre-oxygenation with 100% oxygen Induction Type: IV induction Ventilation: Mask ventilation without difficulty Laryngoscope Size: Mac and 3 Grade View: Grade I Tube type: Oral Tube size: 7.0 mm Number of attempts: 1 Airway Equipment and Method: Stylet Placement Confirmation: ETT inserted through vocal cords under direct vision,  positive ETCO2 and breath sounds checked- equal and bilateral Secured at: 21 cm Tube secured with: Tape Dental Injury: Teeth and Oropharynx as per pre-operative assessment

## 2017-01-23 NOTE — H&P (Signed)
Chief Complaint: Left Kidney Stone  Referring Physician(s): Nickie Retort  Supervising Physician: Aletta Edouard  Patient Status: [facility] - Out-pt  History of Present Illness: Brittany Forbes is a 56 y.o. female with a past medical history of nephrolithiasis requiring PCNL for a large left renal stone who presents today for follow-up of her microscopic hematuria workup.   CT shows a 2 cm mid to lower pole. She also has a 5 mm right nonobstructing stone.  She is here today for nephrostomy tube placement for access to the kidney then she will go to the OR for ureteral stent placement.  She is NPO. No blood thinners.   Past Medical History:  Diagnosis Date  . Anemia    H/O  . Diabetes ([place])   . Family history of adverse reaction to anesthesia    SISTER-PT UNSURE OF REACTION  . GERD (gastroesophageal reflux disease)   . Hematuria   . History of kidney stones   . HLD (hyperlipidemia)   . HTN (hypertension)     Past Surgical History:  Procedure Laterality Date  . KIDNEY STONE SURGERY Left [id number]  . PARATHYROIDECTOMY  [id number]   [facility]  . TONSILLECTOMY      Allergies: Ace inhibitors; Latex; and Tape  Medications: Prior to Admission medications   Medication Sig Start Date End Date Taking? Authorizing Provider  atorvastatin (LIPITOR) 10 MG tablet TAKE 1 TABLET DAILY Patient taking differently: TAKE 1 TABLET DAILY-AM 07/31/16   Glean Hess, MD  Ca Carbonate-Mag Hydroxide (ROLAIDS PO) Take 1 tablet by mouth daily as needed (acid reflux).    [provider]  GLUCOSAMINE HCL-MSM PO Take 1-2 tablets by mouth 2 (two) times daily. Take 2 tablets (1000mg ) in the morning and 1 tablet (500mg ) in the evening    [provider]  glucose blood (ONE TOUCH ULTRA TEST) test strip Use to test blood sugar daily 02/29/16   Glean Hess, MD  ibuprofen (ADVIL,MOTRIN) 200 MG tablet Take 400 mg by mouth 2 (two) times daily as needed for headache or moderate  pain.    [provider]  losartan (COZAAR) 50 MG tablet TAKE 1 TABLET DAILY Patient taking differently: TAKE 1 TABLET DAILY-AM 07/31/16   Glean Hess, MD  metFORMIN (GLUCOPHAGE) 500 MG tablet Take 1 tablet (500 mg total) by mouth 2 (two) times daily. 08/22/16   Glean Hess, MD  MULTIPLE VITAMIN PO Take 1 tablet by mouth daily.    [provider]  [facility] DELICA LANCETS 96E MISC Use 1 each 2 (two) times daily. Extra fine 12/01/11   [provider]     Family History  Problem Relation Age of Onset  . Cancer Mother        lung  . Diabetes Father   . Heart disease Father   . Breast cancer Maternal Aunt 75  . Kidney cancer Neg Hx   . Bladder Cancer Neg Hx     Social History   Socioeconomic History  . Marital status: Single    Spouse name: None  . Number of children: None  . Years of education: None  . Highest education level: None  Social Needs  . Financial resource strain: None  . Food insecurity - worry: None  . Food insecurity - inability: None  . Transportation needs - medical: None  . Transportation needs - non-medical: None  Occupational History  . None  Tobacco Use  . Smoking status: Never Smoker  . Smokeless tobacco: Never Used  Substance and Sexual Activity  . Alcohol use: No    Alcohol/week: 0.0 oz    Frequency: Never  . Drug use: No  . Sexual activity: None  Other Topics Concern  . None  Social History Narrative  . None    Review of Systems: A 12 point ROS discussed Review of Systems  Constitutional: Negative.   HENT: Negative.   Respiratory: Negative.   Cardiovascular: Negative.   Gastrointestinal: Negative.   Genitourinary: Positive for flank pain and hematuria.  Skin: Negative.   Neurological: Negative.   Hematological: Negative.   Psychiatric/Behavioral: Negative.     Vital Signs: BP 131/67   Pulse 80   Resp 16   SpO2 100%   Physical Exam  Constitutional: She is oriented to person, place, and time.  She appears well-developed.  HENT:  Head: Normocephalic and atraumatic.  Eyes: EOM are normal.  Neck: Normal range of motion.  Cardiovascular: Normal rate, regular rhythm and normal heart sounds.  Pulmonary/Chest: Effort normal and breath sounds normal. No stridor. No respiratory distress.  Abdominal: Soft. She exhibits no distension. There is no tenderness.  Musculoskeletal: Normal range of motion.  Neurological: She is alert and oriented to person, place, and time.  Skin: Skin is warm and dry.  Psychiatric: She has a normal mood and affect. Her behavior is normal. Judgment and thought content normal.  Vitals reviewed.   Imaging: No results found.  Labs:  CBC: Recent Labs    08/22/16 1017 12/22/16 0849 01/23/17 0642  WBC 7.8 7.4 7.2  HGB 13.4 13.8 13.4  HCT 40.8 42.3 40.7  PLT 281 252 263    COAGS: Recent Labs    12/22/16 0849 01/23/17 0642  INR 0.92 0.91  APTT 29 28    BMP: Recent Labs    08/22/16 1017 10/22/16 1548 12/22/16 0849 01/23/17 0642  NA 140  --  139 136  K 4.5  --  3.8 3.9  CL 102  --  105 105  CO2 22  --  22 25  GLUCOSE 112*  --  131* 151*  BUN 18 21 22* 16  CALCIUM 9.5  --  9.3 9.1  CREATININE 0.57 0.70 0.70 0.56  GFRNONAA 103 96 >60 >60  GFRAA 118 110 >60 >60    LIVER FUNCTION TESTS: Recent Labs    08/22/16 1017  BILITOT 0.3  AST 19  ALT 27  ALKPHOS 113  PROT 6.9  ALBUMIN 4.5    TUMOR MARKERS: No results for input(s): AFPTM, CEA, CA199, CHROMGRNA in the last 8760 hours.  Assessment and Plan:  Left renal calculi  Will proceed with image guided placement of percutaneous nephrostomy tube by Dr. Kathlene Cote.  Risks and benefits of nephrostomy tube were discussed with the patient including, but not limited to, infection, bleeding, significant bleeding causing loss or decrease in renal function or damage to adjacent structures.   All of the patient's questions were answered, patient is agreeable to proceed.  Consent signed  and in chart.   Thank you for this interesting consult.  I greatly enjoyed meeting Brittany Forbes and look forward to participating in their care.  A copy of this report was sent to the requesting provider on this date.  Electronically Signed: Murrell Redden, PA-C 01/23/2017, 8:00 AM   I spent a total of  30 Minutes  in face to face in clinical consultation, greater than 50% of which was counseling/coordinating care for nephrostomy tube

## 2017-01-23 NOTE — Anesthesia Post-op Follow-up Note (Signed)
Anesthesia QCDR form completed.        

## 2017-01-23 NOTE — Anesthesia Preprocedure Evaluation (Signed)
Anesthesia Evaluation  Patient identified by MRN, date of birth, ID band Patient awake    Reviewed: Allergy & Precautions, NPO status , Patient's Chart, lab work & pertinent test results  History of Anesthesia Complications Negative for: history of anesthetic complications  Airway Mallampati: III       Dental   Pulmonary neg sleep apnea, neg COPD,           Cardiovascular hypertension, Pt. on medications (-) Past MI and (-) CHF (-) dysrhythmias (-) Valvular Problems/Murmurs     Neuro/Psych neg Seizures    GI/Hepatic Neg liver ROS, GERD  ,  Endo/Other  diabetes, Type 2, Oral Hypoglycemic Agents  Renal/GU Renal InsufficiencyRenal disease     Musculoskeletal   Abdominal   Peds  Hematology   Anesthesia Other Findings   Reproductive/Obstetrics                             Anesthesia Physical Anesthesia Plan  ASA: III  Anesthesia Plan: General   Post-op Pain Management:    Induction: Intravenous  PONV Risk Score and Plan: 3 and Dexamethasone, Ondansetron, Midazolam and Treatment may vary due to age or medical condition  Airway Management Planned: Oral ETT  Additional Equipment:   Intra-op Plan:   Post-operative Plan:   Informed Consent: I have reviewed the patients History and Physical, chart, labs and discussed the procedure including the risks, benefits and alternatives for the proposed anesthesia with the patient or authorized representative who has indicated his/her understanding and acceptance.     Plan Discussed with:   Anesthesia Plan Comments:         Anesthesia Quick Evaluation

## 2017-01-23 NOTE — H&P (Signed)
01/23/2017 9:26 AM   Sonda Rumble 03-16-1960 [id number]   HPI: Brittany Forbes is a 56 year old female with a 2 cm left renal pelvic calculus who presents for PCNL.   PMH: Past Medical History:  Diagnosis Date  . Anemia    H/O  . Diabetes ([place])   . Family history of adverse reaction to anesthesia    SISTER-PT UNSURE OF REACTION  . GERD (gastroesophageal reflux disease)   . Hematuria   . History of kidney stones   . HLD (hyperlipidemia)   . HTN (hypertension)     Surgical History: Past Surgical History:  Procedure Laterality Date  . KIDNEY STONE SURGERY Left [id number]  . PARATHYROIDECTOMY  [id number]   [facility]  . TONSILLECTOMY       Allergies:  Allergies  Allergen Reactions  . Ace Inhibitors     Cough  . Latex     Latex bandages cause a rash, paper tape is ok  . Tape     Paper tape ok    Family History: Family History  Problem Relation Age of Onset  . Cancer Mother        lung  . Diabetes Father   . Heart disease Father   . Breast cancer Maternal Aunt 75  . Kidney cancer Neg Hx   . Bladder Cancer Neg Hx     Social History:  reports that  has never smoked. she has never used smokeless tobacco. She reports that she does not drink alcohol or use drugs.  ROS: No significant change from 11/12/2016   Physical Exam: BP 134/75   Pulse 85   Temp 97.8 F (36.6 C) (Tympanic)   Resp 18   Ht 5' (1.524 m)   Wt 211 lb (95.7 kg)   SpO2 96%   BMI 41.21 kg/m   Constitutional:  Alert and oriented, No acute distress. HEENT: [place] AT, moist mucus membranes.  Trachea midline, no masses. Cardiovascular: No clubbing, cyanosis, or edema. Respiratory: Normal respiratory effort, no increased work of breathing. GI: Abdomen is soft, nontender, nondistended, no abdominal masses GU: No CVA tenderness. Skin: No rashes, bruises or suspicious lesions. Lymph: No cervical or inguinal adenopathy. Neurologic: Grossly intact, no focal deficits, moving all 4  extremities. Psychiatric: Normal mood and affect.  Laboratory Data: Lab Results  Component Value Date   WBC 7.2 01/23/2017   HGB 13.4 01/23/2017   HCT 40.7 01/23/2017   MCV 84.6 01/23/2017   PLT 263 01/23/2017    Lab Results  Component Value Date   CREATININE 0.56 01/23/2017     Lab Results  Component Value Date   HGBA1C 6.5 (H) 08/22/2016    Pertinent Imaging: CT reviewed  Results for orders placed during the hospital encounter of 11/05/16  CT HEMATURIA WORKUP   Narrative CLINICAL DATA:  Microscopic hematuria.  Prior stone removal.  EXAM: CT ABDOMEN AND PELVIS WITHOUT AND WITH CONTRAST  TECHNIQUE: Multidetector CT imaging of the abdomen and pelvis was performed following the standard protocol before and following the bolus administration of intravenous contrast.  CONTRAST:  144mL ISOVUE-300 IOPAMIDOL (ISOVUE-300) INJECTION 61%  COMPARISON:  None.  FINDINGS: Lower chest: Coronary artery atherosclerotic calcification. Calcified mitral valve.  Hepatobiliary: Mildly contracted gallbladder. Otherwise unremarkable.  Pancreas: Unremarkable  Spleen: Unremarkable  Adrenals/Urinary Tract: 1.9 cm in long axis left kidney lower pole nonobstructive renal calculus. There is abnormal stranding around the left collecting system and mild thickening of the wall of the left collecting system and UVJ.  5 mm right kidney  lower pole nonobstructive renal calculus.  No appreciable ureteral or bladder calculus. No other filling defect along the urothelium.  Stomach/Bowel: There is abnormal inflammatory stranding in the mesentery about the proximal sigmoid colon on images 66/5 and image 46/10 compatible with mild acute diverticulitis. No extraluminal gas or abscess.  Descending and sigmoid colon diverticulosis.  Vascular/Lymphatic: Aortoiliac atherosclerotic vascular disease.  Reproductive: Uterine fibroids noted.  Adnexa unremarkable.  Other: No supplemental  non-categorized findings.  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 4 mm degenerative retrolisthesis at L2-3. Lumbar spondylosis and degenerative disc disease causes impingement at L2-3, L3-4, and especially L5-S1. Anterior wedging of T12 with a Schmorl's node along the superior endplate.  IMPRESSION: 1. 1.9 cm left kidney lower pole nonobstructive renal calculus. There is nearby abnormal wall thickening and stranding around the left collecting system extending to the UPJ, which could be from secondary inflammation. 2. 5 mm right kidney lower pole nonobstructive renal calculus. 3. Mild acute diverticulitis along the proximal sigmoid colon. Descending and sigmoid colon diverticulosis. 4. Lumbar spondylosis and degenerative disc disease with chronic bilateral pars defects at L5. Impingement observed at L2-3, L3-4, and especially L5-S 1. 5. Other imaging findings of potential clinical significance: Aortic Atherosclerosis (ICD10-I70.0). Coronary atherosclerosis with calcified mitral valve. Uterine fibroids.   Electronically Signed   By: Van Clines M.D.   On: 11/05/2016 15:53      Assessment & Plan:    2 cm left renal pelvic calculus for PCNL.  She had a nephroureteral catheter placed this morning in interventional radiology.  He seen it was discussed in detail including potential risks of bleeding/hemorrhage potentially requiring additional procedures including embolization and rarely loss of kidney.  Injury to surrounding structures including lung and bowel was also discussed.  She was informed there is no guarantee that she will be rendered completely stone free by this procedure and could potentially require follow-up procedures.  She indicated all questions were answered to her satisfaction and desires to proceed.  Abbie Sons, [place] [address] [place], [place]  [id number] [phone]

## 2017-01-23 NOTE — Transfer of Care (Signed)
Immediate Anesthesia Transfer of Care Note  [name]  Procedure(s) Performed: Procedure(s): NEPHROLITHOTOMY PERCUTANEOUS (Left)  Patient Location: PACU  Anesthesia Type:General  Level of Consciousness: sedated  Airway & Oxygen Therapy: Patient Spontanous Breathing and Patient connected to face mask oxygen  Post-op Assessment: Report given to RN and Post -op Vital signs reviewed and stable  Post vital signs: Reviewed and stable  Last Vitals:  Vitals:   01/23/17 0616 01/23/17 1332  BP: 134/75 136/66  Pulse: 85 (!) 109  Resp: 18 17  Temp: 36.6 C 36.5 C  SpO2: 95% 09%    Complications: No apparent anesthesia complications

## 2017-01-24 ENCOUNTER — Observation Stay
Admission: RE | Admit: 2017-01-24 | Discharge: 2017-01-24 | Disposition: A | Discharge status: Home / Self Care | Payer: BC Managed Care – PPO | Source: Ambulatory Visit | Attending: Urology | Admitting: Urology

## 2017-01-24 DIAGNOSIS — Z936 Other artificial openings of urinary tract status: Secondary | ICD-10-CM

## 2017-01-24 DIAGNOSIS — N200 Calculus of kidney: Secondary | ICD-10-CM | POA: Diagnosis not present

## 2017-01-24 LAB — HEMOGLOBIN AND HEMATOCRIT, BLOOD
HCT: 32.6 % — ABNORMAL LOW (ref 35.0–47.0)
HEMOGLOBIN: 10.9 g/dL — AB (ref 12.0–16.0)

## 2017-01-24 MED ORDER — ACETAMINOPHEN 500 MG PO TABS
1000.0000 mg | ORAL_TABLET | Freq: Four times a day (QID) | ORAL | Status: DC | PRN
  Administered 2017-01-24 (×2): 1000 mg via ORAL
  Filled 2017-01-24 (×2): qty 2

## 2017-01-24 NOTE — Progress Notes (Signed)
Pt requesting tylenol for her pain, prefers not to take percocet at this time, md notified, orders taken.

## 2017-01-24 NOTE — Progress Notes (Signed)
IV was removed. Discharge instructions, follow-up appointments, and prescriptions were provided to the pt. All questions were answered. The pt was taken downstairs via wheelchair by voulnteer.

## 2017-01-24 NOTE — Discharge Instructions (Signed)
Percutaneous Nephrolithotomy, Care After °Refer to this sheet in the next few weeks. These instructions provide you with information about caring for yourself after your procedure. Your health care provider may also give you more specific instructions. Your treatment has been planned according to current medical practices, but problems sometimes occur. Call your health care provider if you have any problems or questions after your procedure. °What can I expect after the procedure? °After the procedure, it is common to have: °· Soreness or pain. °· Fatigue. °· Some blood in your urine for a few days. ° °Follow these instructions at home: °Incision care ° °· Follow instructions from your health care provider about how to take care of your cut from surgery (incision). Make sure you: °? Wash your hands with soap and water before you change your bandage (dressing). If soap and water are not available, use hand sanitizer. °? Change your dressing as told by your health care provider. °? Leave stitches (sutures), skin glue, or adhesive strips in place. These skin closures may need to stay in place for 2 weeks or longer. If adhesive strip edges start to loosen and curl up, you may trim the loose edges. Do not remove adhesive strips completely unless your health care provider tells you to do that. °· Check your incision area every day for signs of infection. Check for: °? More redness, swelling, or pain. °? More fluid or blood. °? Warmth. °? Pus or a bad smell. °Activity °· Avoid strenuous activities for as long as told by your health care provider. °· Return to your normal activities as told by your health care provider. Ask your health care provider what activities are safe for you. °General instructions °· Take over-the-counter and prescription medicines only as told by your health care provider. °· Do not drive or operate heavy machinery while taking prescription pain medicine. °· Keep all follow-up visits as told by your  health care provider. This is important. °· You may have been sent home with a catheter or kidney drain tube. If so, carefully follow your health care provider’s instructions on how to take care of your catheter or kidney drain tube. °Contact a health care provider if: °· You have a fever. °· You have more redness, swelling, or pain around your incision. °· You have more fluid or blood coming from your incision. °· Your incision feels warm to the touch. °· You have pus or a bad smell coming from your incision. °· You lose your appetite. °· You feel nauseous or you vomit. °Get help right away if: °· You have blood clots in your urine. °· You cannot urinate. °· You have chest pain or trouble breathing. °This information is not intended to replace advice given to you by your health care provider. Make sure you discuss any questions you have with your health care provider. °Document Released: 02/23/2015 Document Revised: 07/05/2015 Document Reviewed: 07/17/2014 °Elsevier Interactive Patient Education © 2018 [employer] ° °

## 2017-01-24 NOTE — Discharge Summary (Signed)
Physician Discharge Summary  Patient ID: Chanita Boden MRN: [id number] DOB/AGE: 07-25-1960 56 y.o.  Admit date: 01/23/2017 Discharge date: 01/24/2017  Admission Diagnoses:  Nephrolithiasis  Discharge Diagnoses:  Principal Problem:   Nephrolithiasis   Past Medical History:  Diagnosis Date  . Anemia    H/O  . Diabetes ([place])   . Family history of adverse reaction to anesthesia    SISTER-PT UNSURE OF REACTION  . GERD (gastroesophageal reflux disease)   . Hematuria   . History of kidney stones   . HLD (hyperlipidemia)   . HTN (hypertension)     Surgeries: Procedure(s): NEPHROLITHOTOMY PERCUTANEOUS on 01/23/2017   Consultants (if any): Treatment Team:  Hollice Espy, MD Nickie Retort, MD  Discharged Condition: Improved  Hospital Course: Meliana Canner is an 56 y.o. female who was admitted 01/23/2017 with a diagnosis of left  Nephrolithiasis and went to the operating room on 01/23/2017 and underwent the above named procedures.  The tube was clamped at 5am and her foley was removed.  She had no pain.  The tube was removed and site redressed.  She was discharged home.   She was given perioperative antibiotics:  Anti-infectives (From admission, onward)   Start     Dose/Rate Route Frequency Ordered Stop   01/23/17 1800  ceFAZolin (ANCEF) 2 g in dextrose 5 % 100 mL IVPB     2 g 200 mL/hr over 30 Minutes Intravenous Every 8 hours 01/23/17 1528 01/24/17 0152   01/23/17 1530  ceFAZolin (ANCEF) IVPB 2g/100 mL premix  Status:  Discontinued     2 g 200 mL/hr over 30 Minutes Intravenous Every 8 hours 01/23/17 1524 01/23/17 1527   01/23/17 0935  ceFAZolin (ANCEF) 2-4 GM/100ML-% IVPB    Comments:  Ronnell Freshwater   : cabinet override      01/23/17 0935 01/23/17 1030   01/22/17 2303  ceFAZolin (ANCEF) IVPB 2g/100 mL premix     2 g 200 mL/hr over 30 Minutes Intravenous 30 min pre-op 01/22/17 2303 01/23/17 1045   12/22/16 0845  ceFAZolin (ANCEF) IVPB 2g/100 mL premix   Status:  Discontinued     2 g 200 mL/hr over 30 Minutes Intravenous  Once 12/22/16 0835 01/23/17 0107    .  She was given sequential compression devices for DVT prophylaxis.  She benefited maximally from the hospital stay and there were no complications.    Recent vital signs:  Vitals:   01/23/17 2013 01/24/17 0501  BP: (!) 94/49 (!) 112/51  Pulse: 94 86  Resp: 18   Temp: (!) 97.5 F (36.4 C) 98.4 F (36.9 C)  SpO2: 95% 95%    Recent laboratory studies:  Lab Results  Component Value Date   HGB 10.9 (L) 01/24/2017   HGB 13.4 01/23/2017   HGB 13.8 12/22/2016   Lab Results  Component Value Date   WBC 7.2 01/23/2017   PLT 263 01/23/2017   Lab Results  Component Value Date   INR 0.91 01/23/2017   Lab Results  Component Value Date   NA 136 01/23/2017   K 3.9 01/23/2017   CL 105 01/23/2017   CO2 25 01/23/2017   BUN 16 01/23/2017   CREATININE 0.56 01/23/2017   GLUCOSE 151 (H) 01/23/2017    Discharge Medications:   Allergies as of 01/24/2017      Reactions   Ace Inhibitors    Cough   Latex    Latex bandages cause a rash, paper tape is ok   Tape  Paper tape ok      Medication List    TAKE these medications   atorvastatin 10 MG tablet Commonly known as:  LIPITOR TAKE 1 TABLET DAILY What changed:    how much to take  how to take this  when to take this   GLUCOSAMINE HCL-MSM PO Take 1-2 tablets by mouth 2 (two) times daily. Take 2 tablets (1000mg ) in the morning and 1 tablet (500mg ) in the evening   glucose blood test strip Commonly known as:  ONE TOUCH ULTRA TEST Use to test blood sugar daily   ibuprofen 200 MG tablet Commonly known as:  ADVIL,MOTRIN Take 400 mg by mouth 2 (two) times daily as needed for headache or moderate pain.   losartan 50 MG tablet Commonly known as:  COZAAR TAKE 1 TABLET DAILY What changed:    how much to take  how to take this  when to take this   metFORMIN 500 MG tablet Commonly known as:   GLUCOPHAGE Take 1 tablet (500 mg total) by mouth 2 (two) times daily.   MULTIPLE VITAMIN PO Take 1 tablet by mouth daily.   ONETOUCH DELICA LANCETS 59F Misc Use 1 each 2 (two) times daily. Extra fine   oxybutynin 5 MG tablet Commonly known as:  DITROPAN 1 tab tid prn frequency,urgency, bladder spasm   oxyCODONE-acetaminophen 5-325 MG tablet Commonly known as:  PERCOCET/ROXICET Take 1 tablet by mouth every 4 (four) hours as needed for moderate pain.   ROLAIDS PO Take 1 tablet by mouth daily as needed (acid reflux).       Diagnostic Studies: [place] Intraoperative  Result Date: 01/23/2017 CLINICAL DATA:  Ultrasound was provided for use by the ordering physician, and a technical charge was applied by the performing facility.  No radiologist interpretation/professional services rendered.   Dg C-arm Gt 120 Min  Result Date: 01/23/2017 CLINICAL DATA:  Percutaneous nephrolithotomy EXAM: DG C-ARM GT 120 MIN; DG NEPHROSTOGRAM EXISTING ACCESS LEFT COMPARISON:  None. FINDINGS: Multiple intraoperative spot images demonstrate exchange of indwelling catheter for track master sheath. Opacification of decompressed left collecting system. Probable filling defect originally in the midpole calyx, not definitively seen on final images. IMPRESSION: Nephrolithotomy as above. Electronically Signed   By: Rolm Baptise M.D.   On: 01/23/2017 14:12   Dg Nephrostogram Left Thru Existing Access  Result Date: 01/23/2017 CLINICAL DATA:  Percutaneous nephrolithotomy EXAM: DG C-ARM GT 120 MIN; DG NEPHROSTOGRAM EXISTING ACCESS LEFT COMPARISON:  None. FINDINGS: Multiple intraoperative spot images demonstrate exchange of indwelling catheter for track master sheath. Opacification of decompressed left collecting system. Probable filling defect originally in the midpole calyx, not definitively seen on final images. IMPRESSION: Nephrolithotomy as above. Electronically Signed   By: Rolm Baptise M.D.   On: 01/23/2017 14:12    Ir Nephrostomy Placement Left  Result Date: 01/23/2017 CLINICAL DATA:  2 cm left lower infundibular and pelvic renal calculus requiring percutaneous nephrolithotomy. Renal and ureteral catheter access performed prior to operative nephrolithotomy. EXAM: LEFT PERCUTANEOUS URETERAL CATHETER PLACEMENT COMPARISON:  CT of the abdomen and pelvis on 11/05/2016 ANESTHESIA/SEDATION: 3.0 mg IV Versed; 125 mcg IV Fentanyl. Total Moderate Sedation Time 38 minutes. The patient's level of consciousness and physiologic status were continuously monitored during the procedure by Radiology nursing. CONTRAST:  40 mL Isovue 300 injected into the collecting system. MEDICATIONS: 400 mg IV Cipro. Antibiotic was administered in an appropriate time frame prior to skin puncture. FLUOROSCOPY TIME:  15 minutes. 722 mGy. PROCEDURE: The procedure, risks, benefits, and alternatives  were explained to the patient. Questions regarding the procedure were encouraged and answered. The patient understands and consents to the procedure. A time-out was performed prior to initiating the procedure. The left flank region was prepped with chlorhexidine in a sterile fashion, and a sterile drape was applied covering the operative field. A sterile gown and sterile gloves were used for the procedure. Local anesthesia was provided with 1% Lidocaine. Ultrasound was used to localize the left kidney. Under direct ultrasound guidance, a 21 gauge needle was advanced into the renal collecting system. Ultrasound image documentation was performed. Aspiration of urine was performed followed by contrast injection. Additional puncture of the renal collecting system was then performed at the level of a lower pole calyx with a second 21 gauge needle under fluoroscopy. After return of urine, contrast injection was performed to confirm positioning within the collecting system. A guidewire was advanced. A transitional dilator was advanced over the guidewire. A 5 French  catheter was then advanced over the wire into the collecting system. Contrast injection was performed. A hydrophilic guidewire was then advanced into the collecting system. The 5 French catheter was advanced over the hydrophilic guidewire and into the ureter. The catheter was then further advanced into the bladder. The catheter was capped and secured at the skin with a Prolene retention suture and overlying dressing. COMPLICATIONS: None. FINDINGS: Initial fluoroscopy demonstrates an ovoid dense calculus projecting over the lower left kidney. Access was initially performed under ultrasound guidance at the level of the interpolar collecting system to opacify the collecting system. This allowed a more advantageous puncture of a lower pole calyx to provide a better surgical approach to the renal calculus. The renal calculus was relatively impacted at the level of a lower infundibulum. A portion of the calculus extends into the renal pelvis. The collecting system is partially duplicated. There was difficulty negotiating a guidewire around the calculus. Eventually, a hydrophilic guidewire did pass along the undersurface of the calculus and into the renal pelvis and ureter, allowing catheter advancement. IMPRESSION: Left percutaneous renal collecting system access and advancement of a 5 French ureteral catheter to the level of the bladder for nephrolithotomy access. Lower pole access was performed and catheter advanced around a relatively impacted lower infundibular calculus that extends into the renal pelvis. Electronically Signed   By: Aletta Edouard M.D.   On: 01/23/2017 09:59    Disposition: Final discharge disposition not confirmed  Discharge Instructions    Discharge instructions   Complete by:  As directed    Activity:  You are encouraged to ambulate frequently (about every hour during waking hours) to help prevent blood clots from forming in your legs or lungs.  However, you should not engage in any heavy  lifting (> 5-10 lbs), strenuous activity, or straining.  Diet: You should advance your diet as instructed by your physician.  It will be normal to have some bloating, nausea, and abdominal discomfort intermittently.  Prescriptions:  You will be provided a prescription for pain medication to take as needed.  If your pain is not severe enough to require the prescription pain medication, you may take extra strength Tylenol instead which will have less side effects.  You should also take a prescribed stool softener to avoid straining with bowel movements as the prescription pain medication may constipate you.  Incisions: You may remove your dressing bandages 48 hours after surgery if not removed in the hospital.  You will either have some small staples or special tissue glue at each  of the incision sites. Once the bandages are removed (if present), the incisions may stay open to air.  You may start showering (but not soaking or bathing in water) the 2nd day after surgery and the incisions simply need to be patted dry after the shower.  No additional care is needed.  What to call us about: You should call the office if you develop fever > 101 or develop persistent vomiting, redness or draining around your incision, or any other concerning symptoms.    Your prescriptions were electronically sent to [facility] [address] [place], [place] [address] [phone]   Discontinue IV   Complete by:  As directed       Follow-up Information    Stoioff, Ronda Fairly, MD Follow up.   Specialty:  Urology Why:  F/U as scheduled.  Contact information: [address] [place] [address] [phone]            Signed: Irine Seal 01/24/2017, 9:09 AM

## 2017-01-24 NOTE — Care Management (Signed)
No discharge needs identified by members of the care team 

## 2017-01-26 NOTE — Anesthesia Postprocedure Evaluation (Signed)
Anesthesia Post Note  [name]  Procedure(s) Performed: NEPHROLITHOTOMY PERCUTANEOUS (Left )  Patient location during evaluation: PACU Anesthesia Type: General Level of consciousness: awake and alert Pain management: pain level controlled Vital Signs Assessment: post-procedure vital signs reviewed and stable Respiratory status: spontaneous breathing, nonlabored ventilation, respiratory function stable and patient connected to nasal cannula oxygen Cardiovascular status: blood pressure returned to baseline and stable Postop Assessment: no apparent nausea or vomiting Anesthetic complications: no     Last Vitals:  Vitals:   01/23/17 2013 01/24/17 0501  BP: (!) 94/49 (!) 112/51  Pulse: 94 86  Resp: 18   Temp: (!) 36.4 C 36.9 C  SpO2: 95% 95%    Last Pain:  Vitals:   01/24/17 0854  TempSrc:   PainSc: 2                  Molli Barrows

## 2017-01-30 ENCOUNTER — Encounter: Payer: Self-pay | Admitting: Urology

## 2017-01-30 ENCOUNTER — Ambulatory Visit: Payer: BC Managed Care – PPO

## 2017-01-30 ENCOUNTER — Ambulatory Visit (INDEPENDENT_AMBULATORY_CARE_PROVIDER_SITE_OTHER): Payer: BC Managed Care – PPO | Admitting: Urology

## 2017-01-30 ENCOUNTER — Other Ambulatory Visit: Payer: Self-pay

## 2017-01-30 ENCOUNTER — Ambulatory Visit
Admission: RE | Admit: 2017-01-30 | Discharge: 2017-01-30 | Disposition: A | Discharge status: Home / Self Care | Payer: BC Managed Care – PPO | Source: Ambulatory Visit | Attending: Nurse Practitioner | Admitting: Nurse Practitioner

## 2017-01-30 ENCOUNTER — Ambulatory Visit
Admission: RE | Admit: 2017-01-30 | Discharge: 2017-01-30 | Disposition: A | Discharge status: Home / Self Care | Payer: BC Managed Care – PPO | Source: Ambulatory Visit | Attending: Urology | Admitting: Urology

## 2017-01-30 VITALS — BP 144/81 | HR 116 | Ht 60.0 in | Wt 212.0 lb

## 2017-01-30 DIAGNOSIS — N200 Calculus of kidney: Secondary | ICD-10-CM

## 2017-01-30 DIAGNOSIS — Z960 Presence of urogenital implants: Secondary | ICD-10-CM | POA: Insufficient documentation

## 2017-01-30 LAB — URINALYSIS, COMPLETE
Bilirubin, UA: NEGATIVE
GLUCOSE, UA: NEGATIVE
Ketones, UA: NEGATIVE
Nitrite, UA: NEGATIVE
Specific Gravity, UA: <1.005 — ABNORMAL LOW (ref 1.005–1.030)
Urobilinogen, Ur: 0.2 mg/dL (ref 0.2–1.0)
pH, UA: 6 (ref 5.0–7.5)

## 2017-01-30 LAB — MICROSCOPIC EXAMINATION

## 2017-01-30 MED ORDER — CIPROFLOXACIN HCL 500 MG PO TABS
500.0000 mg | ORAL_TABLET | Freq: Once | ORAL | Status: AC
  Administered 2017-01-30: 500 mg via ORAL

## 2017-01-30 MED ORDER — LIDOCAINE HCL 2 % EX GEL
1.0000 "application " | Freq: Once | CUTANEOUS | Status: AC
  Administered 2017-01-30: 1 via URETHRAL

## 2017-01-30 NOTE — Progress Notes (Signed)
   01/30/17  CC: No chief complaint on file.   HPI:s/p PCNL 12/14. She has no complaints.  Mild stent sxs.  KUB clear- stent in good position.  Blood pressure (!) 144/81, pulse (!) 116, height 5' (1.524 m), weight 212 lb (96.2 kg). NED. A&Ox3.   No respiratory distress   Abd soft, NT, ND Normal external genitalia with patent urethral meatus  Cystoscopy with Stent Removal Procedure Note  Patient identification was confirmed, informed consent was obtained, and patient was prepped using Betadine solution.  Lidocaine jelly was administered per urethral meatus.    Preoperative abx where received prior to procedure.    Procedure: - Flexible cystoscope introduced, without any difficulty.   - Thorough search of the bladder revealed:    normal urethral meatus    normal urothelium    no stones    no ulcers     no tumors    no urethral polyps    no trabeculation  - Ureteral orifices were normal in position and appearance. The left ureteral stent was grasped with endoscopic forceps and removed with out difficulty  Post-Procedure: - Patient tolerated the procedure well  Assessment/ Plan: F/U 4-6 weeks with [facility].  She wants to discuss treatment of her right renal calculus on f/u.   Abbie Sons, MD

## 2017-02-04 LAB — STONE ANALYSIS
Ca Oxalate,Dihydrate: 35 %
Ca Oxalate,Monohydr.: 55 %
Ca phos cry stone ql IR: 10 %
STONE WEIGHT KSTONE: 350.9 mg

## 2017-02-25 ENCOUNTER — Ambulatory Visit
Admission: RE | Admit: 2017-02-25 | Discharge: 2017-02-25 | Disposition: A | Discharge status: Home / Self Care | Payer: BC Managed Care – PPO | Source: Ambulatory Visit | Attending: Urology | Admitting: Urology

## 2017-02-25 DIAGNOSIS — N200 Calculus of kidney: Secondary | ICD-10-CM

## 2017-02-25 DIAGNOSIS — N132 Hydronephrosis with renal and ureteral calculous obstruction: Secondary | ICD-10-CM | POA: Insufficient documentation

## 2017-02-26 ENCOUNTER — Ambulatory Visit: Payer: BC Managed Care – PPO | Admitting: Urology

## 2017-02-27 ENCOUNTER — Ambulatory Visit: Payer: BC Managed Care – PPO | Admitting: Internal Medicine

## 2017-03-02 ENCOUNTER — Encounter: Payer: Self-pay | Admitting: Internal Medicine

## 2017-03-02 ENCOUNTER — Ambulatory Visit: Payer: BC Managed Care – PPO | Admitting: Internal Medicine

## 2017-03-02 VITALS — BP 122/74 | HR 96 | Ht 60.0 in | Wt 219.0 lb

## 2017-03-02 DIAGNOSIS — E1169 Type 2 diabetes mellitus with other specified complication: Secondary | ICD-10-CM | POA: Diagnosis not present

## 2017-03-02 DIAGNOSIS — I10 Essential (primary) hypertension: Secondary | ICD-10-CM

## 2017-03-02 DIAGNOSIS — H9311 Tinnitus, right ear: Secondary | ICD-10-CM

## 2017-03-02 DIAGNOSIS — E785 Hyperlipidemia, unspecified: Secondary | ICD-10-CM

## 2017-03-02 DIAGNOSIS — R809 Proteinuria, unspecified: Secondary | ICD-10-CM

## 2017-03-02 DIAGNOSIS — E1129 Type 2 diabetes mellitus with other diabetic kidney complication: Secondary | ICD-10-CM

## 2017-03-02 NOTE — Progress Notes (Signed)
Date:  03/02/2017   Name:  Brittany Forbes   DOB:  Oct 15, 1960   MRN:  [id number]   Chief Complaint: Hypertension and Diabetes (157 this morning. ) Hypertension  This is a chronic problem. The problem is controlled. Pertinent negatives include no chest pain, palpitations or shortness of breath. Past treatments include angiotensin blockers. There are no compliance problems.   Diabetes  She presents for her follow-up diabetic visit. She has type 2 diabetes mellitus. Her disease course has been stable. Pertinent negatives for diabetes include no chest pain, no fatigue, no polydipsia and no polyuria. Symptoms are stable. Current diabetic treatment includes oral agent (monotherapy). She is compliant with treatment all of the time. Her weight is stable. Her breakfast blood glucose is taken between 6-7 am. Her breakfast blood glucose range is generally 140-180 mg/dl.   Tinnitus - has a roaring sound in her right ear since surgery.  It seems to be getting less.  No pain, no hearing loss, no headache.  Kidney stones - recovering from surgery on left kidney.  Has follow up soon.  She had a CT in the workup that showed several other things that concern her.   IO:NGEXBMWUXL: 1. 1.9 cm left kidney lower pole nonobstructive renal calculus. There is nearby abnormal wall thickening and stranding around the left collecting system extending to the UPJ, which could be from secondary inflammation. 2. 5 mm right kidney lower pole nonobstructive renal calculus. 3. Mild acute diverticulitis along the proximal sigmoid colon. Descending and sigmoid colon diverticulosis. 4. Lumbar spondylosis and degenerative disc disease with chronic bilateral pars defects at L5. Impingement observed at L2-3, L3-4, and especially L5-S 1. 5. Other imaging findings of potential clinical significance: Aortic Atherosclerosis (ICD10-I70.0). Coronary atherosclerosis with calcified mitral valve. Uterine fibroids.   Lab Results    Component Value Date   HGBA1C 6.5 (H) 08/22/2016   Lab Results  Component Value Date   CREATININE 0.56 01/23/2017   BUN 16 01/23/2017   NA 136 01/23/2017   K 3.9 01/23/2017   CL 105 01/23/2017   CO2 25 01/23/2017     Review of Systems  Constitutional: Negative for chills, fatigue and fever.  HENT: Positive for tinnitus.   Respiratory: Negative for chest tightness, shortness of breath and wheezing.   Cardiovascular: Negative for chest pain, palpitations and leg swelling.  Gastrointestinal: Negative for abdominal pain, blood in stool, constipation and diarrhea.  Endocrine: Negative for polydipsia and polyuria.  Genitourinary: Negative for dysuria and hematuria.    Patient Active Problem List   Diagnosis Date Noted  . Nephrolithiasis 01/23/2017  . Microscopic hematuria 08/22/2016  . Type 2 diabetes mellitus with microalbuminuria, without long-term current use of insulin ([place]) 06/06/2014  . Essential (primary) hypertension 06/06/2014  . Hyperlipidemia associated with type 2 diabetes mellitus ([place]) 06/06/2014  . Persistent proteinuria associated with type 2 diabetes mellitus ([place]) 06/06/2014  . Abnormal liver enzymes 09/12/2011  . History of parathyroidectomy 05/29/2011    Prior to Admission medications   Medication Sig Start Date End Date Taking? Authorizing Provider  atorvastatin (LIPITOR) 10 MG tablet TAKE 1 TABLET DAILY Patient taking differently: TAKE 1 TABLET DAILY-AM 07/31/16   Glean Hess, MD  Ca Carbonate-Mag Hydroxide (ROLAIDS PO) Take 1 tablet by mouth daily as needed (acid reflux).    [provider]  GLUCOSAMINE HCL-MSM PO Take 1-2 tablets by mouth 2 (two) times daily. Take 2 tablets (1000mg ) in the morning and 1 tablet (500mg ) in the evening  [provider]  glucose blood (ONE TOUCH ULTRA TEST) test strip Use to test blood sugar daily 02/29/16   Glean Hess, MD  ibuprofen (ADVIL,MOTRIN) 200 MG tablet Take 400 mg by mouth 2 (two)  times daily as needed for headache or moderate pain.    [provider]  losartan (COZAAR) 50 MG tablet TAKE 1 TABLET DAILY Patient taking differently: TAKE 1 TABLET DAILY-AM 07/31/16   Glean Hess, MD  metFORMIN (GLUCOPHAGE) 500 MG tablet Take 1 tablet (500 mg total) by mouth 2 (two) times daily. 08/22/16   Glean Hess, MD  MULTIPLE VITAMIN PO Take 1 tablet by mouth daily.    [provider]  [facility] DELICA LANCETS 13Y MISC Use 1 each 2 (two) times daily. Extra fine 12/01/11   [provider]  oxybutynin (DITROPAN) 5 MG tablet 1 tab tid prn frequency,urgency, bladder spasm 01/23/17   Stoioff, Ronda Fairly, MD    Allergies  Allergen Reactions  . Ace Inhibitors     Cough  . Latex     Latex bandages cause a rash, paper tape is ok  . Tape     Paper tape ok    Past Surgical History:  Procedure Laterality Date  . IR NEPHROSTOMY PLACEMENT LEFT  01/23/2017  . KIDNEY STONE SURGERY Left [id number]  . NEPHROLITHOTOMY Left 01/23/2017   Procedure: NEPHROLITHOTOMY PERCUTANEOUS;  Surgeon: Abbie Sons, MD;  Location: [facility] ORS;  Service: Urology;  Laterality: Left;  . PARATHYROIDECTOMY  [id number]   [facility]  . TONSILLECTOMY      Social History   Tobacco Use  . Smoking status: Never Smoker  . Smokeless tobacco: Never Used  Substance Use Topics  . Alcohol use: No    Alcohol/week: 0.0 oz    Frequency: Never  . Drug use: No     Medication list has been reviewed and updated.  PHQ 2/9 Scores 08/22/2016 08/17/2015  PHQ - 2 Score 0 0    Physical Exam  Constitutional: She is oriented to person, place, and time. She appears well-developed. No distress.  HENT:  Head: Normocephalic and atraumatic.  Right Ear: Tympanic membrane and ear canal normal.  Left Ear: Tympanic membrane and ear canal normal.  Mouth/Throat: No posterior oropharyngeal edema or posterior oropharyngeal erythema.  Neck: Normal range of motion. Neck supple. No thyromegaly present.    Cardiovascular: Normal rate, regular rhythm and normal heart sounds.  Pulmonary/Chest: Effort normal and breath sounds normal. No respiratory distress. She has no wheezes.  Musculoskeletal: Normal range of motion. She exhibits no edema or tenderness.  Neurological: She is alert and oriented to person, place, and time.  Skin: Skin is warm and dry. No rash noted.  Psychiatric: She has a normal mood and affect. Her behavior is normal. Thought content normal.  Nursing note and vitals reviewed.   BP 122/74   Pulse 96   Ht 5' (1.524 m)   Wt 219 lb (99.3 kg)   SpO2 98%   BMI 42.77 kg/m   Assessment and Plan: 1. Type 2 diabetes mellitus with microalbuminuria, without long-term current use of insulin (HCC) Consider increasing metformin to 3 per day if A1C >8 - Hemoglobin A1c  2. Essential (primary) hypertension controlled  3. Hyperlipidemia associated with type 2 diabetes mellitus (HCC) Will increase lipitor to 20 mg to achieve LDL goal < 70  4. Tinnitus aurium, right If persistent, will refer to ENT   No orders of the defined types were placed in this encounter.  Partially dictated using Editor, commissioning. Any errors are unintentional.  Halina Maidens, MD [facility]  03/02/2017

## 2017-03-02 NOTE — Patient Instructions (Signed)
Increase Atorvastatin to 20 mg daily.  Call me for a new Rx when nearly out.

## 2017-03-03 LAB — HEMOGLOBIN A1C
ESTIMATED AVERAGE GLUCOSE: 143 mg/dL
Hgb A1c MFr Bld: 6.6 % — ABNORMAL HIGH (ref 4.8–5.6)

## 2017-03-06 ENCOUNTER — Encounter: Payer: Self-pay | Admitting: Urology

## 2017-03-06 ENCOUNTER — Ambulatory Visit: Payer: BC Managed Care – PPO | Admitting: Urology

## 2017-03-06 VITALS — BP 137/85 | HR 106 | Ht 60.0 in | Wt 218.4 lb

## 2017-03-06 DIAGNOSIS — N200 Calculus of kidney: Secondary | ICD-10-CM

## 2017-03-09 ENCOUNTER — Encounter: Payer: Self-pay | Admitting: Urology

## 2017-03-09 NOTE — Progress Notes (Signed)
03/06/2017 7:38 AM   Brittany Forbes [birth year] [id number]  Referring provider: Glean Hess, MD [address] [place], [place] [address]  Chief Complaint  Patient presents with  . Follow-up    RUS results    HPI: 57 year old female presents for postop follow-up.  She is status post left PCNL on 01/23/2017 for a 2 cm left renal pelvic calculus.  Her stone was radiopaque and no significant residual calculi were seen intraoperatively and on follow-up postoperative imaging.  Her stent was removed on 01/30/2017 and she had no problems.  She had a follow-up renal ultrasound performed on 1/16 which was felt to show a 5 mm left lower pole calculus and a small, nonobstructing right renal calculus.  She denies flank, abdominal or pelvic pain.   PMH: Past Medical History:  Diagnosis Date  . Anemia    H/O  . Diabetes ([place])   . Family history of adverse reaction to anesthesia    SISTER-PT UNSURE OF REACTION  . GERD (gastroesophageal reflux disease)   . Hematuria   . History of kidney stones   . HLD (hyperlipidemia)   . HTN (hypertension)     Surgical History: Past Surgical History:  Procedure Laterality Date  . IR NEPHROSTOMY PLACEMENT LEFT  01/23/2017  . KIDNEY STONE SURGERY Left [id number]  . NEPHROLITHOTOMY Left 01/23/2017   Procedure: NEPHROLITHOTOMY PERCUTANEOUS;  Surgeon: Abbie Sons, MD;  Location: [facility] ORS;  Service: Urology;  Laterality: Left;  . PARATHYROIDECTOMY  [id number]   [facility]  . TONSILLECTOMY      Home Medications:  Allergies as of 03/06/2017      Reactions   Ace Inhibitors    Cough   Latex    Latex bandages cause a rash, paper tape is ok   Tape    Paper tape ok      Medication List        Accurate as of 03/06/17 11:59 PM. Always use your most recent med list.          atorvastatin 10 MG tablet Commonly known as:  LIPITOR TAKE 1 TABLET DAILY   GLUCOSAMINE HCL-MSM PO Take 1-2 tablets by mouth 2 (two) times daily. Take 2 tablets  (1000mg ) in the morning and 1 tablet (500mg ) in the evening   glucose blood test strip Commonly known as:  ONE TOUCH ULTRA TEST Use to test blood sugar daily   ibuprofen 200 MG tablet Commonly known as:  ADVIL,MOTRIN Take 400 mg by mouth 2 (two) times daily as needed for headache or moderate pain.   losartan 50 MG tablet Commonly known as:  COZAAR TAKE 1 TABLET DAILY   metFORMIN 500 MG tablet Commonly known as:  GLUCOPHAGE Take 1 tablet (500 mg total) by mouth 2 (two) times daily.   MULTIPLE VITAMIN PO Take 1 tablet by mouth daily.   ONETOUCH DELICA LANCETS 60F Misc Use 1 each 2 (two) times daily. Extra fine   ROLAIDS PO Take 1 tablet by mouth daily as needed (acid reflux).       Allergies:  Allergies  Allergen Reactions  . Ace Inhibitors     Cough  . Latex     Latex bandages cause a rash, paper tape is ok  . Tape     Paper tape ok    Family History: Family History  Problem Relation Age of Onset  . Cancer Mother        lung  . Diabetes Father   . Heart disease Father   .  Breast cancer Maternal Aunt 75  . Kidney cancer Neg Hx   . Bladder Cancer Neg Hx     Social History:  reports that  has never smoked. she has never used smokeless tobacco. She reports that she does not drink alcohol or use drugs.  ROS: UROLOGY Frequent Urination?: No Hard to postpone urination?: No Burning/pain with urination?: No Get up at night to urinate?: No Leakage of urine?: No Urine stream starts and stops?: No Trouble starting stream?: No Do you have to strain to urinate?: No Blood in urine?: No Urinary tract infection?: No Sexually transmitted disease?: No Injury to kidneys or bladder?: No Painful intercourse?: No Weak stream?: No Currently pregnant?: No Vaginal bleeding?: No Last menstrual period?: n  Gastrointestinal Nausea?: No Vomiting?: No Indigestion/heartburn?: No Diarrhea?: No Constipation?: No  Constitutional Fever: No Night sweats?: No Weight  loss?: No Fatigue?: No  Skin Skin rash/lesions?: No Itching?: No  Eyes Blurred vision?: No Double vision?: No  Ears/Nose/Throat Sore throat?: No Sinus problems?: No  Hematologic/Lymphatic Swollen glands?: No Easy bruising?: No  Cardiovascular Leg swelling?: No Chest pain?: No  Respiratory Cough?: No Shortness of breath?: No  Endocrine Excessive thirst?: No  Musculoskeletal Back pain?: No Joint pain?: No  Neurological Headaches?: No Dizziness?: No  Psychologic Depression?: No Anxiety?: No  Physical Exam: BP 137/85 (BP Location: Left Arm, Patient Position: Sitting, Cuff Size: Large)   Pulse (!) 106   Ht 5' (1.524 m)   Wt 218 lb 6.4 oz (99.1 kg)   BMI 42.65 kg/m   Constitutional:  Alert and oriented, No acute distress. Psychiatric: Normal mood and affect.  Laboratory Data: Lab Results  Component Value Date   WBC 7.2 01/23/2017   HGB 10.9 (L) 01/24/2017   HCT 32.6 (L) 01/24/2017   MCV 84.6 01/23/2017   PLT 263 01/23/2017    Lab Results  Component Value Date   CREATININE 0.56 01/23/2017     Lab Results  Component Value Date   HGBA1C 6.6 (H) 03/02/2017     Pertinent Imaging:  Results for orders placed during the hospital encounter of 01/30/17  Abdomen 1 view (KUB)   Narrative CLINICAL DATA:  Nephrolithiasis  EXAM: ABDOMEN - 1 VIEW  COMPARISON:  01/23/2017.  CT 11/05/2016  FINDINGS: Left ureteral stent is in place. Small punctate calcifications project over the mid and lower pole of the left kidney, likely small residual stones. No definite stone along the course of the left ureteral stent.  Nonobstructive bowel gas pattern.  No free air organomegaly.  IMPRESSION: Left ureteral stent in place. A few small punctate stones in the mid to lower left kidney. No definite calcification along the left ureteral stent.   Electronically Signed   By: Rolm Baptise M.D.   On: 01/30/2017 16:27     Results for orders placed during the  hospital encounter of 02/25/17  Ultrasound renal complete   Narrative CLINICAL DATA:  Nephrolithiasis. Patient status post left nephrolithotomy 01/23/2017.  EXAM: RENAL / URINARY TRACT ULTRASOUND COMPLETE  COMPARISON:  Images from nephrolithotomy 01/23/2017. CT abdomen and pelvis 11/05/2016.  FINDINGS: Right Kidney:  Length: 11.4 cm. Echogenicity within normal limits. Small focus of hyperechogenicity in the lower pole is compatible with the small stone seen on the prior CT. No mass or hydronephrosis visualized.  Left Kidney:  Length: 11.3 cm. Echogenicity within normal limits. 0.5 cm focus of hyperechogenicity in the lower pole is compatible with a nonobstructing stone. There is mild hydronephrosis. No mass visualized.  Bladder:  Appears normal for degree of bladder distention.  IMPRESSION: Mild hydronephrosis left kidney. 0.5 cm echogenic focus in the lower pole left kidney is compatible with a nonobstructing stone.  Small echogenic focus in the lower pole the right kidney is compatible with the nonobstructing stone seen on prior CT.   Electronically Signed   By: Inge Rise M.D.   On: 02/26/2017 08:42     Results for orders placed during the hospital encounter of 11/05/16  CT HEMATURIA WORKUP   Narrative CLINICAL DATA:  Microscopic hematuria.  Prior stone removal.  EXAM: CT ABDOMEN AND PELVIS WITHOUT AND WITH CONTRAST  TECHNIQUE: Multidetector CT imaging of the abdomen and pelvis was performed following the standard protocol before and following the bolus administration of intravenous contrast.  CONTRAST:  150mL ISOVUE-300 IOPAMIDOL (ISOVUE-300) INJECTION 61%  COMPARISON:  None.  FINDINGS: Lower chest: Coronary artery atherosclerotic calcification. Calcified mitral valve.  Hepatobiliary: Mildly contracted gallbladder. Otherwise unremarkable.  Pancreas: Unremarkable  Spleen: Unremarkable  Adrenals/Urinary Tract: 1.9 cm in long axis left  kidney lower pole nonobstructive renal calculus. There is abnormal stranding around the left collecting system and mild thickening of the wall of the left collecting system and UVJ.  5 mm right kidney lower pole nonobstructive renal calculus.  No appreciable ureteral or bladder calculus. No other filling defect along the urothelium.  Stomach/Bowel: There is abnormal inflammatory stranding in the mesentery about the proximal sigmoid colon on images 66/5 and image 46/10 compatible with mild acute diverticulitis. No extraluminal gas or abscess.  Descending and sigmoid colon diverticulosis.  Vascular/Lymphatic: Aortoiliac atherosclerotic vascular disease.  Reproductive: Uterine fibroids noted.  Adnexa unremarkable.  Other: No supplemental non-categorized findings.  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 4 mm degenerative retrolisthesis at L2-3. Lumbar spondylosis and degenerative disc disease causes impingement at L2-3, L3-4, and especially L5-S1. Anterior wedging of T12 with a Schmorl's node along the superior endplate.  IMPRESSION: 1. 1.9 cm left kidney lower pole nonobstructive renal calculus. There is nearby abnormal wall thickening and stranding around the left collecting system extending to the UPJ, which could be from secondary inflammation. 2. 5 mm right kidney lower pole nonobstructive renal calculus. 3. Mild acute diverticulitis along the proximal sigmoid colon. Descending and sigmoid colon diverticulosis. 4. Lumbar spondylosis and degenerative disc disease with chronic bilateral pars defects at L5. Impingement observed at L2-3, L3-4, and especially L5-S 1. 5. Other imaging findings of potential clinical significance: Aortic Atherosclerosis (ICD10-I70.0). Coronary atherosclerosis with calcified mitral valve. Uterine fibroids.   Electronically Signed   By: Van Clines M.D.   On: 11/05/2016 15:53       Assessment & Plan:   Doing well status post left PCNL.  I am not convinced there is a 5 mm calculus remaining in the left kidney.  Will obtain follow-up KUB in 3 months.  I have also recommended proceeding with a metabolic evaluation.   Return in about 3 months (around 06/01/2017) for Recheck, KUB.  Abbie Sons, [place] [address] [place], [place] [address] [phone]

## 2017-04-16 ENCOUNTER — Encounter: Payer: Self-pay | Admitting: Internal Medicine

## 2017-04-16 ENCOUNTER — Ambulatory Visit: Payer: BC Managed Care – PPO | Admitting: Internal Medicine

## 2017-04-16 VITALS — BP 126/66 | HR 105 | Temp 98.4°F | Ht 60.0 in | Wt 217.0 lb

## 2017-04-16 DIAGNOSIS — J0100 Acute maxillary sinusitis, unspecified: Secondary | ICD-10-CM | POA: Diagnosis not present

## 2017-04-16 MED ORDER — AMOXICILLIN-POT CLAVULANATE 875-125 MG PO TABS: 1.0000 | ORAL_TABLET | Freq: Two times a day (BID) | ORAL | 0 refills | Status: AC

## 2017-04-16 NOTE — Progress Notes (Signed)
Date:  04/16/2017   Name:  Brittany Forbes   DOB:  25-Sep-1960   MRN:  [id number]   Chief Complaint: Cough (X 1.5 week. Cough- yellow mucous. Sore throat, and congestion- hurts in chest and nose. Low grade fever. Took tylenol.)  Cough  This is a new problem. The current episode started in the past 7 days. The problem has been gradually worsening. The cough is productive of sputum. Associated symptoms include headaches and a sore throat. Pertinent negatives include no chest pain, chills, ear pain, fever or wheezing.  Sinus Problem  This is a new problem. The current episode started in the past 7 days. The problem has been gradually worsening since onset. There has been no fever. The pain is mild. Associated symptoms include coughing, headaches, a hoarse voice, sinus pressure and a sore throat. Pertinent negatives include no chills or ear pain.     Review of Systems  Constitutional: Negative for chills, fatigue and fever.  HENT: Positive for hoarse voice, sinus pressure and sore throat. Negative for ear pain.   Respiratory: Positive for cough and chest tightness. Negative for wheezing.   Cardiovascular: Negative for chest pain and palpitations.  Neurological: Positive for headaches. Negative for dizziness.    Patient Active Problem List   Diagnosis Date Noted  . 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 Nephrolithiasis 01/23/2017  . Microscopic hematuria 08/22/2016  . Type 2 diabetes mellitus with microalbuminuria, without long-term current use of insulin ([place]) 06/06/2014  . Essential (primary) hypertension 06/06/2014  . Hyperlipidemia associated with type 2 diabetes mellitus ([place]) 06/06/2014  . Persistent proteinuria associated with type 2 diabetes mellitus ([place]) 06/06/2014  . Abnormal liver enzymes 09/12/2011  . History of parathyroidectomy 05/29/2011    Prior to Admission medications   Medication Sig Start Date End Date Taking? Authorizing Provider  atorvastatin (LIPITOR) 10 MG  tablet TAKE 1 TABLET DAILY Patient taking differently: TAKE 1 TABLET DAILY-AM 07/31/16  Yes Glean Hess, MD  Ca Carbonate-Mag Hydroxide (ROLAIDS PO) Take 1 tablet by mouth daily as needed (acid reflux).   Yes [provider]  GLUCOSAMINE HCL-MSM PO Take 1-2 tablets by mouth 2 (two) times daily. Take 2 tablets (1000mg ) in the morning and 1 tablet (500mg ) in the evening   Yes [provider]  glucose blood (ONE TOUCH ULTRA TEST) test strip Use to test blood sugar daily 02/29/16  Yes Glean Hess, MD  ibuprofen (ADVIL,MOTRIN) 200 MG tablet Take 400 mg by mouth 2 (two) times daily as needed for headache or moderate pain.   Yes [provider]  losartan (COZAAR) 50 MG tablet TAKE 1 TABLET DAILY Patient taking differently: TAKE 1 TABLET DAILY-AM 07/31/16  Yes Glean Hess, MD  metFORMIN (GLUCOPHAGE) 500 MG tablet Take 1 tablet (500 mg total) by mouth 2 (two) times daily. 08/22/16  Yes Glean Hess, MD  MULTIPLE VITAMIN PO Take 1 tablet by mouth daily.   Yes [provider]  [facility] DELICA LANCETS 78L MISC Use 1 each 2 (two) times daily. Extra fine 12/01/11  Yes [provider]    Allergies  Allergen Reactions  . Ace Inhibitors     Cough  . Latex     Latex bandages cause a rash, paper tape is ok  . Tape     Paper tape ok    Past Surgical History:  Procedure Laterality Date  . IR NEPHROSTOMY PLACEMENT LEFT  01/23/2017  . KIDNEY STONE SURGERY Left [id number]  . NEPHROLITHOTOMY Left  01/23/2017   Procedure: NEPHROLITHOTOMY PERCUTANEOUS;  Surgeon: Abbie Sons, MD;  Location: [facility] ORS;  Service: Urology;  Laterality: Left;  . PARATHYROIDECTOMY  [id number]   [facility]  . TONSILLECTOMY      Social History   Tobacco Use  . Smoking status: Never Smoker  . Smokeless tobacco: Never Used  Substance Use Topics  . Alcohol use: No    Alcohol/week: 0.0 oz    Frequency: Never  . Drug use: No     Medication list has been reviewed and  updated.  PHQ 2/9 Scores 08/22/2016 08/17/2015  PHQ - 2 Score 0 0    Physical Exam  Constitutional: She is oriented to person, place, and time. She appears well-developed and well-nourished.  HENT:  Right Ear: External ear and ear canal normal. Tympanic membrane is not erythematous and not retracted.  Left Ear: External ear and ear canal normal. Tympanic membrane is not erythematous and not retracted.  Nose: Right sinus exhibits maxillary sinus tenderness and frontal sinus tenderness. Left sinus exhibits maxillary sinus tenderness and frontal sinus tenderness.  Mouth/Throat: Uvula is midline and mucous membranes are normal. No oral lesions. Posterior oropharyngeal erythema present. No oropharyngeal exudate.  Cardiovascular: Normal rate, regular rhythm and normal heart sounds.  Pulmonary/Chest: Breath sounds normal. She has no wheezes. She has no rales.  Lymphadenopathy:    She has no cervical adenopathy.  Neurological: She is alert and oriented to person, place, and time.    BP 126/66   Pulse (!) 105   Temp 98.4 F (36.9 C) (Oral)   Ht 5' (1.524 m)   Wt 217 lb (98.4 kg)   SpO2 97%   BMI 42.38 kg/m   Assessment and Plan: 1. Acute non-recurrent maxillary sinusitis Begin Flonase, continue Tylenol - amoxicillin-clavulanate (AUGMENTIN) 875-125 MG tablet; Take 1 tablet by mouth 2 (two) times daily for 10 days.  Dispense: 20 tablet; Refill: 0   Meds ordered this encounter  Medications  . amoxicillin-clavulanate (AUGMENTIN) 875-125 MG tablet    Sig: Take 1 tablet by mouth 2 (two) times daily for 10 days.    Dispense:  20 tablet    Refill:  0    Partially dictated using Editor, commissioning. Any errors are unintentional.  Halina Maidens, MD [facility]  04/16/2017

## 2017-04-28 ENCOUNTER — Other Ambulatory Visit: Payer: BC Managed Care – PPO

## 2017-05-11 NOTE — Telephone Encounter (Signed)
Opened in error

## 2017-05-26 ENCOUNTER — Other Ambulatory Visit: Payer: Self-pay | Admitting: Internal Medicine

## 2017-06-02 ENCOUNTER — Ambulatory Visit
Admission: RE | Admit: 2017-06-02 | Discharge: 2017-06-02 | Disposition: A | Discharge status: Home / Self Care | Payer: BC Managed Care – PPO | Source: Ambulatory Visit | Attending: Urology | Admitting: Urology

## 2017-06-02 ENCOUNTER — Ambulatory Visit (INDEPENDENT_AMBULATORY_CARE_PROVIDER_SITE_OTHER): Payer: BC Managed Care – PPO | Admitting: Urology

## 2017-06-02 DIAGNOSIS — N200 Calculus of kidney: Secondary | ICD-10-CM

## 2017-06-03 NOTE — Progress Notes (Signed)
Patient was rescheduled as I was detained in surgery

## 2017-06-05 ENCOUNTER — Encounter: Payer: Self-pay | Admitting: Urology

## 2017-06-05 ENCOUNTER — Ambulatory Visit: Payer: BC Managed Care – PPO | Admitting: Urology

## 2017-06-05 ENCOUNTER — Other Ambulatory Visit: Payer: Self-pay | Admitting: Urology

## 2017-06-05 VITALS — BP 124/80 | HR 96 | Resp 16 | Ht 60.0 in | Wt 220.8 lb

## 2017-06-05 DIAGNOSIS — R82991 Hypocitraturia: Secondary | ICD-10-CM | POA: Diagnosis not present

## 2017-06-05 DIAGNOSIS — N200 Calculus of kidney: Secondary | ICD-10-CM | POA: Diagnosis not present

## 2017-06-05 MED ORDER — POTASSIUM CITRATE ER 10 MEQ (1080 MG) PO TBCR
10.0000 meq | EXTENDED_RELEASE_TABLET | Freq: Two times a day (BID) | ORAL | 3 refills | Status: DC
Start: 2017-06-05 — End: 2018-02-26

## 2017-06-05 NOTE — Progress Notes (Signed)
06/05/2017 10:39 AM   Sonda Rumble 02/22/1960 [id number]  Referring provider: Glean Hess, MD [address] [place], [place] [address]  Chief Complaint  Patient presents with  . Follow-up    HPI: 57 year old female presents for follow-up of nephrolithiasis.  She is status post PCNL in December 2018 for a 2 cm left renal pelvic calculus.  Since her last visit she denies flank or abdominal pain.  KUB performed earlier this week showed minute calcifications in the left lower pole measuring 1-2 mm. Her stone was mixed calcium oxalate.  24-hour urine study performed March 2019 showed excellent urine volume at 2.3 L.  She had mild hypercalciuria at 291 mg per 24 hours.  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  Her urine uric acid was elevated.  Serum calcium and uric acid levels were normal.   PMH: Past Medical History:  Diagnosis Date  . Anemia    H/O  . Diabetes ([place])   . Family history of adverse reaction to anesthesia    SISTER-PT UNSURE OF REACTION  . GERD (gastroesophageal reflux disease)   . Hematuria   . History of kidney stones   . HLD (hyperlipidemia)   . HTN (hypertension)     Surgical History: Past Surgical History:  Procedure Laterality Date  . IR NEPHROSTOMY PLACEMENT LEFT  01/23/2017  . KIDNEY STONE SURGERY Left [id number]  . NEPHROLITHOTOMY Left 01/23/2017   Procedure: NEPHROLITHOTOMY PERCUTANEOUS;  Surgeon: Abbie Sons, MD;  Location: [facility] ORS;  Service: Urology;  Laterality: Left;  . PARATHYROIDECTOMY  [id number]   [facility]  . TONSILLECTOMY      Home Medications:  Allergies as of 06/05/2017      Reactions   Ace Inhibitors    Cough   Latex    Latex bandages cause a rash, paper tape is ok   Tape    Paper tape ok      Medication List        Accurate as of 06/05/17 10:39 AM. Always use your most recent med list.          atorvastatin 10 MG tablet Commonly known as:  LIPITOR TAKE 1 TABLET DAILY   GLUCOSAMINE HCL-MSM PO Take 1-2 tablets by mouth 2 (two) times daily. Take 2 tablets (1000mg ) in the morning and 1 tablet (500mg ) in the evening   glucose blood test strip Commonly known as:  ONE TOUCH ULTRA TEST Use to test blood sugar daily   ibuprofen 200 MG tablet Commonly known as:  ADVIL,MOTRIN Take 400 mg by mouth 2 (two) times daily as needed for headache or moderate pain.   losartan 50 MG tablet Commonly known as:  COZAAR TAKE 1 TABLET DAILY   metFORMIN 500 MG tablet Commonly known as:  GLUCOPHAGE Take 1 tablet (500 mg total) by mouth 2 (two) times daily.   metFORMIN 500 MG tablet Commonly known as:  GLUCOPHAGE TAKE 1 TABLET TWICE A DAY   MULTIPLE VITAMIN PO Take 1 tablet by mouth daily.   ONETOUCH DELICA LANCETS 30Q Misc Use 1 each 2 (two) times daily. Extra fine   ROLAIDS PO Take 1 tablet by mouth daily as needed (acid reflux).       Allergies:  Allergies  Allergen Reactions  . Ace Inhibitors     Cough  . Latex     Latex bandages cause a rash, paper tape is ok  . Tape     Paper tape ok  Family History: Family History  Problem Relation Age of Onset  . Cancer Mother        lung  . Diabetes Father   . Heart disease Father   . Breast cancer Maternal Aunt 75  . Kidney cancer Neg Hx   . Bladder Cancer Neg Hx     Social History:  reports that she has never smoked. She has never used smokeless tobacco. She reports that she does not drink alcohol or use drugs.  ROS: UROLOGY Frequent Urination?: No Hard to postpone urination?: No Burning/pain with urination?: No Get up at night to urinate?: No Leakage of urine?: No Urine stream starts and stops?: No Trouble starting stream?: No Do you have to strain to urinate?: No Blood in urine?: No Urinary tract infection?: No Sexually transmitted disease?: No Injury to kidneys or bladder?: No Painful intercourse?: No Weak stream?: No Currently pregnant?: No Vaginal bleeding?: No Last menstrual  period?: n  Gastrointestinal Nausea?: No Vomiting?: No Indigestion/heartburn?: No Diarrhea?: No Constipation?: No  Constitutional Fever: No Night sweats?: No Weight loss?: No Fatigue?: No  Skin Skin rash/lesions?: No Itching?: No  Eyes Blurred vision?: No Double vision?: No  Ears/Nose/Throat Sore throat?: No Sinus problems?: No  Hematologic/Lymphatic Swollen glands?: No Easy bruising?: No  Cardiovascular Leg swelling?: No Chest pain?: No  Respiratory Cough?: No Shortness of breath?: No  Endocrine Excessive thirst?: No  Musculoskeletal Back pain?: No Joint pain?: No  Neurological Headaches?: No Dizziness?: No  Psychologic Depression?: No Anxiety?: No  Physical Exam: BP 124/80   Pulse 96   Resp 16   Ht 5' (1.524 m)   Wt 220 lb 12.8 oz (100.2 kg)   SpO2 98%   BMI 43.12 kg/m   Constitutional:  Alert and oriented, No acute distress.   Pertinent Imaging: KUB was reviewed Results for orders placed during the hospital encounter of 06/02/17  Abdomen 1 view (KUB)   Narrative CLINICAL DATA:  Nephrolithiasis  EXAM: ABDOMEN - 1 VIEW  COMPARISON:  January 30, 2017  FINDINGS: There are several 1-2 mm calculi in the lower pole of the left kidney. No other abnormal calcifications are evident. There is moderate stool throughout the colon. There is no bowel dilatation or air-fluid level to suggest bowel obstruction. No free air.  IMPRESSION: Lower pole left renal calculi. No bowel obstruction or free air. Moderate stool throughout colon.   Electronically Signed   By: Lowella Grip III M.D.   On: 06/03/2017 08:57       Assessment & Plan:   Doing well status post PCNL.  24 urine study remarkable for hypercalciuria, hypocitraturia, elevated uric acid and low urine pH.  Would recommend starting potassium citrate and Rx was sent to her pharmacy.  I discussed thiazide therapy for hypercalciuria.  Will hold off and repeat a 24 urine study in  3 months.    Abbie Sons, [place] [address], [place], [place] [address] [phone]

## 2017-07-14 ENCOUNTER — Other Ambulatory Visit: Payer: Self-pay | Admitting: Internal Medicine

## 2017-07-14 ENCOUNTER — Telehealth: Payer: Self-pay

## 2017-07-14 MED ORDER — ATORVASTATIN CALCIUM 20 MG PO TABS: 20.0000 mg | ORAL_TABLET | Freq: Every day | ORAL | 0 refills | Status: DC

## 2017-07-14 MED ORDER — ATORVASTATIN CALCIUM 20 MG PO TABS: 20.0000 mg | ORAL_TABLET | Freq: Every day | ORAL | 1 refills | Status: DC

## 2017-07-14 NOTE — Telephone Encounter (Signed)
I would just change to 20 mg once a day.  What pharmacy?

## 2017-07-14 NOTE — Telephone Encounter (Signed)
Patient called needing refill on Lipitor. Patient is taking 10 mg bid. Patient last chart stated she needed to be on 20 mg. Do you want to change Rx to 20 mg qd or still keep at bid. Please advise

## 2017-07-14 NOTE — Telephone Encounter (Signed)
[facility] [address] for [facility] day due to she only has a week of medication left and is leaving out of town. She needs a 90 day supply sent to [facility]. Please sent to both locations thank you

## 2017-07-16 ENCOUNTER — Other Ambulatory Visit: Payer: Self-pay | Admitting: Internal Medicine

## 2017-07-21 ENCOUNTER — Telehealth: Payer: Self-pay

## 2017-07-21 NOTE — Telephone Encounter (Signed)
[facility] called requesting which medication patient should be taking. Speaking with pharmacist to follow up on which medication.

## 2017-08-10 ENCOUNTER — Other Ambulatory Visit: Payer: Self-pay | Admitting: Internal Medicine

## 2017-08-26 ENCOUNTER — Encounter: Payer: Self-pay | Admitting: Internal Medicine

## 2017-08-26 ENCOUNTER — Ambulatory Visit (INDEPENDENT_AMBULATORY_CARE_PROVIDER_SITE_OTHER): Payer: BC Managed Care – PPO | Admitting: Internal Medicine

## 2017-08-26 VITALS — BP 116/72 | HR 93 | Ht 60.0 in | Wt 218.0 lb

## 2017-08-26 DIAGNOSIS — E1129 Type 2 diabetes mellitus with other diabetic kidney complication: Secondary | ICD-10-CM

## 2017-08-26 DIAGNOSIS — E785 Hyperlipidemia, unspecified: Secondary | ICD-10-CM

## 2017-08-26 DIAGNOSIS — Z0001 Encounter for general adult medical examination with abnormal findings: Secondary | ICD-10-CM

## 2017-08-26 DIAGNOSIS — Z1231 Encounter for screening mammogram for malignant neoplasm of breast: Secondary | ICD-10-CM | POA: Diagnosis not present

## 2017-08-26 DIAGNOSIS — R748 Abnormal levels of other serum enzymes: Secondary | ICD-10-CM | POA: Diagnosis not present

## 2017-08-26 DIAGNOSIS — R809 Proteinuria, unspecified: Secondary | ICD-10-CM | POA: Diagnosis not present

## 2017-08-26 DIAGNOSIS — E1169 Type 2 diabetes mellitus with other specified complication: Secondary | ICD-10-CM

## 2017-08-26 DIAGNOSIS — Z1239 Encounter for other screening for malignant neoplasm of breast: Secondary | ICD-10-CM

## 2017-08-26 DIAGNOSIS — I10 Essential (primary) hypertension: Secondary | ICD-10-CM

## 2017-08-26 DIAGNOSIS — Z0000 Encounter for general adult medical examination without abnormal findings: Secondary | ICD-10-CM

## 2017-08-26 LAB — POCT URINALYSIS DIPSTICK
Bilirubin, UA: NEGATIVE
Glucose, UA: NEGATIVE
Ketones, UA: NEGATIVE
LEUKOCYTES UA: NEGATIVE
NITRITE UA: NEGATIVE
PH UA: 7 (ref 5.0–8.0)
PROTEIN UA: NEGATIVE
RBC UA: NEGATIVE
UROBILINOGEN UA: 0.2 U/dL

## 2017-08-26 NOTE — Progress Notes (Signed)
Date:  08/26/2017   Name:  Brittany Forbes   DOB:  08-01-1960   MRN:  [id number]   Chief Complaint: Annual Exam (Breast Exam. Pap next year.) Brittany Forbes is a 57 y.o. female who presents today for her Complete Annual Exam. She feels well. She reports exercising regularly. She reports she is sleeping well. She is due for a mammogram this year.  Pap is due next year.  Diabetes  She presents for her follow-up diabetic visit. She has type 2 diabetes mellitus. Pertinent negatives for hypoglycemia include no dizziness, headaches, nervousness/anxiousness or tremors. Pertinent negatives for diabetes include no chest pain, no fatigue, no foot paresthesias, no polydipsia, no polyuria and no weakness. Symptoms are stable. Pertinent negatives for diabetic complications include no CVA, nephropathy or retinopathy. An ACE inhibitor/angiotensin II receptor blocker is being taken.  Hypertension  The problem is controlled. Pertinent negatives include no chest pain, headaches, palpitations or shortness of breath. Past treatments include angiotensin blockers. The current treatment provides significant improvement. There is no history of CVA or retinopathy.  Hyperlipidemia  The problem is controlled. Pertinent negatives include no chest pain or shortness of breath. Current antihyperlipidemic treatment includes statins.  Kidney stones - seeing Dr. Bernardo Heater.  Had a stone removed. Last visit assessment - 24 urine study remarkable for hypercalciuria, hypocitraturia, elevated uric acid and low urine pH.  Would recommend starting potassium citrate and Rx was sent to her pharmacy.  I discussed thiazide therapy for hypercalciuria  Lab Results  Component Value Date   HGBA1C 6.6 (H) 03/02/2017   Lab Results  Component Value Date   CREATININE 0.56 01/23/2017   BUN 16 01/23/2017   NA 136 01/23/2017   K 3.9 01/23/2017   CL 105 01/23/2017   CO2 25 01/23/2017    Review of Systems  Constitutional: Negative for  chills, fatigue and fever.  HENT: Negative for congestion, hearing loss, tinnitus, trouble swallowing and voice change.   Eyes: Negative for visual disturbance.  Respiratory: Negative for cough, chest tightness, shortness of breath and wheezing.   Cardiovascular: Negative for chest pain, palpitations and leg swelling.  Gastrointestinal: Negative for abdominal pain, constipation, diarrhea and vomiting.  Endocrine: Negative for polydipsia and polyuria.  Genitourinary: Negative for dysuria, frequency, genital sores, vaginal bleeding and vaginal discharge.  Musculoskeletal: Negative for arthralgias, gait problem and joint swelling.  Skin: Negative for color change and rash.  Neurological: Negative for dizziness, tremors, weakness, light-headedness and headaches.  Hematological: Negative for adenopathy. Does not bruise/bleed easily.  Psychiatric/Behavioral: Negative for dysphoric mood and sleep disturbance. The patient is not nervous/anxious.     Patient Active Problem List   Diagnosis Date Noted  . 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 Nephrolithiasis 01/23/2017  . Microscopic hematuria 08/22/2016  . Type 2 diabetes mellitus with microalbuminuria, without long-term current use of insulin ([place]) 06/06/2014  . Essential (primary) hypertension 06/06/2014  . Hyperlipidemia associated with type 2 diabetes mellitus ([place]) 06/06/2014  . Persistent proteinuria associated with type 2 diabetes mellitus ([place]) 06/06/2014  . Abnormal liver enzymes 09/12/2011  . History of parathyroidectomy 05/29/2011    Prior to Admission medications   Medication Sig Start Date End Date Taking? Authorizing Provider  atorvastatin (LIPITOR) 10 MG tablet TAKE 1 TABLET DAILY 07/16/17   Glean Hess, MD  atorvastatin (LIPITOR) 20 MG tablet Take 1 tablet (20 mg total) by mouth daily. 07/14/17   Glean Hess, MD  atorvastatin (LIPITOR) 20 MG tablet TAKE 1 TABLET BY  MOUTH EVERY DAY 08/10/17    Glean Hess, MD  Ca Carbonate-Mag Hydroxide (ROLAIDS PO) Take 1 tablet by mouth daily as needed (acid reflux).    [provider]  GLUCOSAMINE HCL-MSM PO Take 1-2 tablets by mouth 2 (two) times daily. Take 2 tablets (1000mg ) in the morning and 1 tablet (500mg ) in the evening    [provider]  glucose blood (ONE TOUCH ULTRA TEST) test strip Use to test blood sugar daily 02/29/16   Glean Hess, MD  ibuprofen (ADVIL,MOTRIN) 200 MG tablet Take 400 mg by mouth 2 (two) times daily as needed for headache or moderate pain.    [provider]  losartan (COZAAR) 50 MG tablet TAKE 1 TABLET DAILY Patient taking differently: TAKE 1 TABLET DAILY-AM 07/31/16   Glean Hess, MD  losartan (COZAAR) 50 MG tablet TAKE 1 TABLET DAILY 07/16/17   Glean Hess, MD  metFORMIN (GLUCOPHAGE) 500 MG tablet Take 1 tablet (500 mg total) by mouth 2 (two) times daily. 08/22/16   Glean Hess, MD  metFORMIN (GLUCOPHAGE) 500 MG tablet TAKE 1 TABLET TWICE A DAY 05/26/17   Glean Hess, MD  MULTIPLE VITAMIN PO Take 1 tablet by mouth daily.    [provider]  [facility] DELICA LANCETS 92E MISC Use 1 each 2 (two) times daily. Extra fine 12/01/11   [provider]  potassium citrate (UROCIT-K) 10 MEQ (1080 MG) SR tablet Take 1 tablet (10 mEq total) by mouth 2 (two) times daily with a meal. 06/05/17   Stoioff, Ronda Fairly, MD    Allergies  Allergen Reactions  . Ace Inhibitors     Cough  . Latex     Latex bandages cause a rash, paper tape is ok  . Tape     Paper tape ok    Past Surgical History:  Procedure Laterality Date  . IR NEPHROSTOMY PLACEMENT LEFT  01/23/2017  . KIDNEY STONE SURGERY Left [id number]  . NEPHROLITHOTOMY Left 01/23/2017   Procedure: NEPHROLITHOTOMY PERCUTANEOUS;  Surgeon: Abbie Sons, MD;  Location: [facility] ORS;  Service: Urology;  Laterality: Left;  . PARATHYROIDECTOMY  [id number]   [facility]  . TONSILLECTOMY      Social History   Tobacco Use    . Smoking status: Never Smoker  . Smokeless tobacco: Never Used  Substance Use Topics  . Alcohol use: No    Alcohol/week: 0.0 oz    Frequency: Never  . Drug use: No     Medication list has been reviewed and updated.  Current Meds  Medication Sig  . atorvastatin (LIPITOR) 10 MG tablet TAKE 1 TABLET DAILY  . Ca Carbonate-Mag Hydroxide (ROLAIDS PO) Take 1 tablet by mouth daily as needed (acid reflux).  Marland Kitchen GLUCOSAMINE HCL-MSM PO Take 1-2 tablets by mouth 2 (two) times daily. Take 2 tablets (1000mg ) in the morning and 1 tablet (500mg ) in the evening  . glucose blood (ONE TOUCH ULTRA TEST) test strip Use to test blood sugar daily  . ibuprofen (ADVIL,MOTRIN) 200 MG tablet Take 400 mg by mouth 2 (two) times daily as needed for headache or moderate pain.  Marland Kitchen losartan (COZAAR) 50 MG tablet TAKE 1 TABLET DAILY  . metFORMIN (GLUCOPHAGE) 500 MG tablet TAKE 1 TABLET TWICE A DAY  . MULTIPLE VITAMIN PO Take 1 tablet by mouth daily.  Glory Rosebush DELICA LANCETS 26S MISC Use 1 each 2 (two) times daily. Extra fine  . potassium citrate (UROCIT-K) 10 MEQ (1080 MG) SR tablet Take 1  tablet (10 mEq total) by mouth 2 (two) times daily with a meal.    PHQ 2/9 Scores 08/26/2017 08/22/2016 08/17/2015  PHQ - 2 Score 0 0 0    Physical Exam  Constitutional: She is oriented to person, place, and time. She appears well-developed and well-nourished. No distress.  HENT:  Head: Normocephalic and atraumatic.  Right Ear: Tympanic membrane and ear canal normal.  Left Ear: Tympanic membrane and ear canal normal.  Nose: Right sinus exhibits no maxillary sinus tenderness. Left sinus exhibits no maxillary sinus tenderness.  Mouth/Throat: Uvula is midline and oropharynx is clear and moist.  Eyes: Conjunctivae and EOM are normal. Right eye exhibits no discharge. Left eye exhibits no discharge. No scleral icterus.  Neck: Normal range of motion. Carotid bruit is not present. No erythema present. No thyromegaly present.   Cardiovascular: Normal rate, regular rhythm, normal heart sounds and normal pulses.  Pulmonary/Chest: Effort normal. No respiratory distress. She has no wheezes. Right breast exhibits no mass, no nipple discharge, no skin change and no tenderness. Left breast exhibits no mass, no nipple discharge, no skin change and no tenderness.  Abdominal: Soft. Bowel sounds are normal. There is no hepatosplenomegaly. There is no tenderness. There is no CVA tenderness.  Musculoskeletal: Normal range of motion.  Lymphadenopathy:    She has no cervical adenopathy.    She has no axillary adenopathy.  Neurological: She is alert and oriented to person, place, and time. She has normal reflexes. No cranial nerve deficit or sensory deficit.  Skin: Skin is warm, dry and intact. No rash noted.  Psychiatric: She has a normal mood and affect. Her speech is normal and behavior is normal. Thought content normal.  Nursing note and vitals reviewed.   BP 116/72   Pulse 93   Ht 5' (1.524 m)   Wt 218 lb (98.9 kg)   SpO2 98%   BMI 42.58 kg/m   Assessment and Plan: 1. Annual physical exam Continue healthy diet and exercise  2. Breast cancer screening At [place]; Future  3. Essential (primary) hypertension Controlled Discussed increasing cozaar to 100 mg if not already done with last refill (due to persistent urine proteinuria) - CBC with Differential/Platelet  4. Hyperlipidemia associated with type 2 diabetes mellitus ([place]) On statin therapy - Lipid panel  5. Type 2 diabetes mellitus with microalbuminuria, without long-term current use of insulin ([facility]) Doing well with diet and oral medication - Hemoglobin A1c - TSH - POCT urinalysis dipstick  6. Abnormal liver enzymes Check labs - Comprehensive metabolic panel   No orders of the defined types were placed in this encounter.   Partially dictated using Editor, commissioning. Any errors are unintentional.  Halina Maidens,  MD [facility]  08/26/2017

## 2017-08-27 LAB — COMPREHENSIVE METABOLIC PANEL
A/G RATIO: 2 (ref 1.2–2.2)
ALK PHOS: 122 IU/L — AB (ref 39–117)
ALT: 25 IU/L (ref 0–32)
AST: 19 IU/L (ref 0–40)
Albumin: 4.7 g/dL (ref 3.5–5.5)
BUN/Creatinine Ratio: 25 — ABNORMAL HIGH (ref 9–23)
BUN: 16 mg/dL (ref 6–24)
Bilirubin Total: 0.3 mg/dL (ref 0.0–1.2)
CO2: 25 mmol/L (ref 20–29)
Calcium: 9.8 mg/dL (ref 8.7–10.2)
Chloride: 98 mmol/L (ref 96–106)
Creatinine, Ser: 0.63 mg/dL (ref 0.57–1.00)
GFR calc Af Amer: 114 mL/min/{1.73_m2} (ref >59)
GFR calc non Af Amer: 98 mL/min/{1.73_m2} (ref >59)
GLOBULIN, TOTAL: 2.3 g/dL (ref 1.5–4.5)
Glucose: 113 mg/dL — ABNORMAL HIGH (ref 65–99)
POTASSIUM: 4.4 mmol/L (ref 3.5–5.2)
SODIUM: 139 mmol/L (ref 134–144)
Total Protein: 7 g/dL (ref 6.0–8.5)

## 2017-08-27 LAB — LIPID PANEL
Chol/HDL Ratio: 3 ratio (ref 0.0–4.4)
Cholesterol, Total: 172 mg/dL (ref 100–199)
HDL: 57 mg/dL (ref >39)
LDL Calculated: 89 mg/dL (ref 0–99)
Triglycerides: 129 mg/dL (ref 0–149)
VLDL CHOLESTEROL CAL: 26 mg/dL (ref 5–40)

## 2017-08-27 LAB — CBC WITH DIFFERENTIAL/PLATELET
BASOS: 1 %
Basophils Absolute: 0 10*3/uL (ref 0.0–0.2)
EOS (ABSOLUTE): 0.1 10*3/uL (ref 0.0–0.4)
EOS: 2 %
HEMATOCRIT: 41.1 % (ref 34.0–46.6)
Hemoglobin: 13.6 g/dL (ref 11.1–15.9)
Immature Grans (Abs): 0 10*3/uL (ref 0.0–0.1)
Immature Granulocytes: 0 %
LYMPHS ABS: 2.8 10*3/uL (ref 0.7–3.1)
Lymphs: 33 %
MCH: 28 pg (ref 26.6–33.0)
MCHC: 33.1 g/dL (ref 31.5–35.7)
MCV: 85 fL (ref 79–97)
MONOS ABS: 0.5 10*3/uL (ref 0.1–0.9)
Monocytes: 6 %
Neutrophils Absolute: 5 10*3/uL (ref 1.4–7.0)
Neutrophils: 58 %
PLATELETS: 307 10*3/uL (ref 150–450)
RBC: 4.86 x10E6/uL (ref 3.77–5.28)
RDW: 14.6 % (ref 12.3–15.4)
WBC: 8.5 10*3/uL (ref 3.4–10.8)

## 2017-08-27 LAB — TSH: TSH: 1.05 u[IU]/mL (ref 0.450–4.500)

## 2017-08-27 LAB — HEMOGLOBIN A1C
ESTIMATED AVERAGE GLUCOSE: 148 mg/dL
HEMOGLOBIN A1C: 6.8 % — AB (ref 4.8–5.6)

## 2017-09-29 ENCOUNTER — Ambulatory Visit
Admission: RE | Admit: 2017-09-29 | Discharge: 2017-09-29 | Disposition: A | Discharge status: Home / Self Care | Payer: BC Managed Care – PPO | Source: Ambulatory Visit | Attending: Internal Medicine | Admitting: Internal Medicine

## 2017-09-29 DIAGNOSIS — Z1239 Encounter for other screening for malignant neoplasm of breast: Secondary | ICD-10-CM

## 2017-09-29 DIAGNOSIS — Z1231 Encounter for screening mammogram for malignant neoplasm of breast: Secondary | ICD-10-CM | POA: Diagnosis present

## 2017-11-03 LAB — HM DIABETES EYE EXAM

## 2017-11-06 ENCOUNTER — Encounter: Payer: Self-pay | Admitting: Internal Medicine

## 2017-11-11 ENCOUNTER — Other Ambulatory Visit: Payer: Self-pay | Admitting: Internal Medicine

## 2017-12-31 ENCOUNTER — Other Ambulatory Visit: Payer: Self-pay

## 2017-12-31 DIAGNOSIS — I10 Essential (primary) hypertension: Secondary | ICD-10-CM | POA: Diagnosis not present

## 2017-12-31 DIAGNOSIS — Z91040 Latex allergy status: Secondary | ICD-10-CM | POA: Insufficient documentation

## 2017-12-31 DIAGNOSIS — N200 Calculus of kidney: Secondary | ICD-10-CM | POA: Diagnosis not present

## 2017-12-31 DIAGNOSIS — Z7984 Long term (current) use of oral hypoglycemic drugs: Secondary | ICD-10-CM | POA: Insufficient documentation

## 2017-12-31 DIAGNOSIS — E119 Type 2 diabetes mellitus without complications: Secondary | ICD-10-CM | POA: Insufficient documentation

## 2017-12-31 DIAGNOSIS — R1031 Right lower quadrant pain: Secondary | ICD-10-CM | POA: Diagnosis present

## 2017-12-31 DIAGNOSIS — Z79899 Other long term (current) drug therapy: Secondary | ICD-10-CM | POA: Insufficient documentation

## 2017-12-31 LAB — CBC
HCT: 37.4 % (ref 36.0–46.0)
Hemoglobin: 12.2 g/dL (ref 12.0–15.0)
MCH: 27.3 pg (ref 26.0–34.0)
MCHC: 32.6 g/dL (ref 30.0–36.0)
MCV: 83.7 fL (ref 80.0–100.0)
NRBC: 0 % (ref 0.0–0.2)
PLATELETS: 272 10*3/uL (ref 150–400)
RBC: 4.47 MIL/uL (ref 3.87–5.11)
RDW: 14.5 % (ref 11.5–15.5)
WBC: 12 10*3/uL — AB (ref 4.0–10.5)

## 2017-12-31 LAB — URINALYSIS, COMPLETE (UACMP) WITH MICROSCOPIC
BILIRUBIN URINE: NEGATIVE
Bacteria, UA: NONE SEEN
GLUCOSE, UA: NEGATIVE mg/dL
Ketones, ur: NEGATIVE mg/dL
NITRITE: NEGATIVE
PH: 5 (ref 5.0–8.0)
Protein, ur: 100 mg/dL — AB
SPECIFIC GRAVITY, URINE: 1.032 — AB (ref 1.005–1.030)

## 2017-12-31 LAB — COMPREHENSIVE METABOLIC PANEL
ALT: 27 U/L (ref 0–44)
ANION GAP: 9 (ref 5–15)
AST: 29 U/L (ref 15–41)
Albumin: 4.2 g/dL (ref 3.5–5.0)
Alkaline Phosphatase: 114 U/L (ref 38–126)
BUN: 26 mg/dL — ABNORMAL HIGH (ref 6–20)
CALCIUM: 9.2 mg/dL (ref 8.9–10.3)
CHLORIDE: 103 mmol/L (ref 98–111)
CO2: 25 mmol/L (ref 22–32)
Creatinine, Ser: 0.71 mg/dL (ref 0.44–1.00)
GFR calc Af Amer: >60 mL/min (ref >60)
GFR calc non Af Amer: >60 mL/min (ref >60)
Glucose, Bld: 222 mg/dL — ABNORMAL HIGH (ref 70–99)
Potassium: 3.8 mmol/L (ref 3.5–5.1)
SODIUM: 137 mmol/L (ref 135–145)
Total Bilirubin: 0.5 mg/dL (ref 0.3–1.2)
Total Protein: 6.8 g/dL (ref 6.5–8.1)

## 2017-12-31 LAB — LIPASE, BLOOD: LIPASE: 36 U/L (ref 11–51)

## 2017-12-31 NOTE — ED Triage Notes (Signed)
Pt comes via POV from home with c/o right sided abdominal pain that started this evening. Pt states that it starts on the side and travels to her back. Pt states N/V.  Pt states some frequency with urination but denies any burning.

## 2018-01-01 ENCOUNTER — Emergency Department: Payer: BC Managed Care – PPO

## 2018-01-01 ENCOUNTER — Emergency Department
Admission: EM | Admit: 2018-01-01 | Discharge: 2018-01-01 | Disposition: A | Discharge status: Home / Self Care | Payer: BC Managed Care – PPO | Attending: Emergency Medicine | Admitting: Emergency Medicine

## 2018-01-01 DIAGNOSIS — N200 Calculus of kidney: Secondary | ICD-10-CM

## 2018-01-01 MED ORDER — HYDROCODONE-ACETAMINOPHEN 5-325 MG PO TABS
2.0000 | ORAL_TABLET | Freq: Once | ORAL | Status: AC
  Administered 2018-01-01: 2 via ORAL
  Filled 2018-01-01: qty 2

## 2018-01-01 MED ORDER — ONDANSETRON 4 MG PO TBDP: 4.0000 mg | ORAL_TABLET | Freq: Three times a day (TID) | ORAL | 0 refills | Status: DC | PRN

## 2018-01-01 MED ORDER — ONDANSETRON 4 MG PO TBDP
8.0000 mg | ORAL_TABLET | Freq: Once | ORAL | Status: AC
  Administered 2018-01-01: 8 mg via ORAL
  Filled 2018-01-01: qty 2

## 2018-01-01 MED ORDER — HYDROCODONE-ACETAMINOPHEN 5-325 MG PO TABS: 1.0000 | ORAL_TABLET | Freq: Four times a day (QID) | ORAL | 0 refills | Status: DC | PRN

## 2018-01-01 MED ORDER — TAMSULOSIN HCL 0.4 MG PO CAPS
0.4000 mg | ORAL_CAPSULE | Freq: Once | ORAL | Status: AC
  Administered 2018-01-01: 0.4 mg via ORAL
  Filled 2018-01-01: qty 1

## 2018-01-01 MED ORDER — TAMSULOSIN HCL 0.4 MG PO CAPS: 0.4000 mg | ORAL_CAPSULE | Freq: Every day | ORAL | 0 refills | Status: DC

## 2018-01-01 MED ORDER — KETOROLAC TROMETHAMINE 30 MG/ML IJ SOLN
30.0000 mg | Freq: Once | INTRAMUSCULAR | Status: AC
  Administered 2018-01-01: 30 mg via INTRAMUSCULAR
  Filled 2018-01-01: qty 1

## 2018-01-01 MED ORDER — IBUPROFEN 600 MG PO TABS: 600.0000 mg | ORAL_TABLET | Freq: Three times a day (TID) | ORAL | 0 refills | Status: DC | PRN

## 2018-01-01 NOTE — Discharge Instructions (Signed)
Please begin taking Flomax every day to help your stone pass and use your pain and nausea medication as needed for severe symptoms.  Follow-up with your urologist this coming week for recheck if your stone has not passed by the end of the weekend.  The most important thing to know is if you develop a urinary tract infection with a kidney stone can be life-threatening so return to the emergency department immediately for any fevers or chills.  It was a pleasure to take care of you today, and thank you for coming to our emergency department.  If you have any questions or concerns before leaving please ask the nurse to grab me and I'm more than happy to go through your aftercare instructions again.  If you were prescribed any opioid pain medication today such as Norco, Vicodin, Percocet, morphine, hydrocodone, or oxycodone please make sure you do not drive when you are taking this medication as it can alter your ability to drive safely.  If you have any concerns once you are home that you are not improving or are in fact getting worse before you can make it to your follow-up appointment, please do not hesitate to call [phone] and come back for further evaluation.  Darel Hong, MD  Results for orders placed or performed during the hospital encounter of 01/01/18  Lipase, blood  Result Value Ref Range   Lipase 36 11 - 51 U/L  Comprehensive metabolic panel  Result Value Ref Range   Sodium 137 135 - 145 mmol/L   Potassium 3.8 3.5 - 5.1 mmol/L   Chloride 103 98 - 111 mmol/L   CO2 25 22 - 32 mmol/L   Glucose, Bld 222 (H) 70 - 99 mg/dL   BUN 26 (H) 6 - 20 mg/dL   Creatinine, Ser 0.71 0.44 - 1.00 mg/dL   Calcium 9.2 8.9 - 10.3 mg/dL   Total Protein 6.8 6.5 - 8.1 g/dL   Albumin 4.2 3.5 - 5.0 g/dL   AST 29 15 - 41 U/L   ALT 27 0 - 44 U/L   Alkaline Phosphatase 114 38 - 126 U/L   Total Bilirubin 0.5 0.3 - 1.2 mg/dL   GFR calc non Af Amer >60 >60 mL/min   GFR calc Af Amer >60 >60 mL/min   Anion gap 9  5 - 15  CBC  Result Value Ref Range   WBC 12.0 (H) 4.0 - 10.5 K/uL   RBC 4.47 3.87 - 5.11 MIL/uL   Hemoglobin 12.2 12.0 - 15.0 g/dL   HCT 37.4 36.0 - 46.0 %   MCV 83.7 80.0 - 100.0 fL   MCH 27.3 26.0 - 34.0 pg   MCHC 32.6 30.0 - 36.0 g/dL   RDW 14.5 11.5 - 15.5 %   Platelets 272 150 - 400 K/uL   nRBC 0.0 0.0 - 0.2 %  Urinalysis, Complete w Microscopic  Result Value Ref Range   Color, Urine YELLOW (A) YELLOW   APPearance HAZY (A) CLEAR   Specific Gravity, Urine 1.032 (H) 1.005 - 1.030   pH 5.0 5.0 - 8.0   Glucose, UA NEGATIVE NEGATIVE mg/dL   Hgb urine dipstick MODERATE (A) NEGATIVE   Bilirubin Urine NEGATIVE NEGATIVE   Ketones, ur NEGATIVE NEGATIVE mg/dL   Protein, ur 100 (A) NEGATIVE mg/dL   Nitrite NEGATIVE NEGATIVE   Leukocytes, UA TRACE (A) NEGATIVE   RBC / HPF >50 (H) 0 - 5 RBC/hpf   WBC, UA 21-50 0 - 5 WBC/hpf   Bacteria,  UA NONE SEEN NONE SEEN   Squamous Epithelial / LPF 0-5 0 - 5   Mucus PRESENT    Ca Oxalate Crys, UA PRESENT    Ct Renal Stone Study  Result Date: 01/01/2018 CLINICAL DATA:  Right-sided abdominal pain starting this evening. Nausea and vomiting. Urinary frequency. EXAM: CT ABDOMEN AND PELVIS WITHOUT CONTRAST TECHNIQUE: Multidetector CT imaging of the abdomen and pelvis was performed following the standard protocol without IV contrast. COMPARISON:  11/05/2016 FINDINGS: Lower chest: Lung bases are clear. Small esophageal hiatal hernia. Calcification in the mitral valve annulus. Hepatobiliary: No focal liver abnormality is seen. No gallstones, gallbladder wall thickening, or biliary dilatation. Pancreas: Unremarkable. No pancreatic ductal dilatation or surrounding inflammatory changes. Spleen: Normal in size without focal abnormality. Adrenals/Urinary Tract: No adrenal gland nodules. 5 mm stone in the distal right ureter at the ureterovesical junction with mild proximal hydronephrosis and hydroureter. Mild stranding around the right kidney. Nonobstructing stone  in the central left kidney measuring 4 mm diameter. No hydronephrosis or hydroureter on the left. Bladder is decompressed. No bladder stones are identified. Stomach/Bowel: Stomach, small bowel, and colon are mostly decompressed. Scattered stool in the colon. No inflammatory changes or wall thickening. There is an appendicoliths at the base of the appendix but the appendix is otherwise normal. No evidence of appendicitis. Scattered diverticula in the sigmoid colon without evidence of diverticulitis. Vascular/Lymphatic: Aortic atherosclerosis. No enlarged abdominal or pelvic lymph nodes. Reproductive: Nodularity of the uterus with calcifications consistent with fibroids. Other: No abdominal wall hernia or abnormality. No abdominopelvic ascites. 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 Diffuse degenerative change throughout the lumbar spine. No destructive bone lesions. IMPRESSION: 1. 5 mm stone in the distal right ureter with moderate proximal obstruction. 2. Nonobstructing stone in the left kidney. 3. Uterine fibroids. Aortic Atherosclerosis (ICD10-I70.0). Electronically Signed   By: Lucienne Capers M.D.   On: 01/01/2018 01:03   While here in the ER today you received very powerful medicine that makes it unsafe for you to drive for the rest of the day.  Do not drive until tomorrow.

## 2018-01-01 NOTE — ED Provider Notes (Signed)
[facility] Provider Note  ____________________________________________   First MD Initiated Contact with Patient 01/01/18 0138     (approximate)  I have reviewed the triage vital signs and the nursing notes.   HISTORY  Chief Complaint Abdominal Pain   HPI Brittany Forbes is a 57 y.o. female who self presents to the emergency department with sudden onset severe right ankle and right sided abdominal pain that seems to radiate straight to her back.  She is noted urinary frequency but no dysuria.  Fevers or chills.  No hematuria.  Pain was sudden onset severe.  It seems to come in "waves".  Nothing particular seems to make it better or worse.  She has had multiple kidney stones in the past and this does feel similar.  She has required surgical treatment of kidney stones previously.    Past Medical History:  Diagnosis Date  . Anemia    H/O  . Diabetes ([place])   . Family history of adverse reaction to anesthesia    SISTER-PT UNSURE OF REACTION  . GERD (gastroesophageal reflux disease)   . Hematuria   . History of kidney stones   . HLD (hyperlipidemia)   . HTN (hypertension)     Patient Active Problem List   Diagnosis Date Noted  . 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 Nephrolithiasis 01/23/2017  . Microscopic hematuria 08/22/2016  . Type 2 diabetes mellitus with microalbuminuria, without long-term current use of insulin ([place]) 06/06/2014  . Essential (primary) hypertension 06/06/2014  . Hyperlipidemia associated with type 2 diabetes mellitus ([place]) 06/06/2014  . Persistent proteinuria associated with type 2 diabetes mellitus ([place]) 06/06/2014  . Abnormal liver enzymes 09/12/2011  . History of parathyroidectomy 05/29/2011    Past Surgical History:  Procedure Laterality Date  . IR NEPHROSTOMY PLACEMENT LEFT  01/23/2017  . KIDNEY STONE SURGERY Left [id number]  . NEPHROLITHOTOMY Left 01/23/2017   Procedure:  NEPHROLITHOTOMY PERCUTANEOUS;  Surgeon: Abbie Sons, MD;  Location: [facility] ORS;  Service: Urology;  Laterality: Left;  . PARATHYROIDECTOMY  [id number]   [facility]  . TONSILLECTOMY      Prior to Admission medications   Medication Sig Start Date End Date Taking? Authorizing Provider  atorvastatin (LIPITOR) 10 MG tablet TAKE 1 TABLET DAILY 07/16/17   Glean Hess, MD  Ca Carbonate-Mag Hydroxide (ROLAIDS PO) Take 1 tablet by mouth daily as needed (acid reflux).    [provider]  GLUCOSAMINE HCL-MSM PO Take 1-2 tablets by mouth 2 (two) times daily. Take 2 tablets (1000mg ) in the morning and 1 tablet (500mg ) in the evening    [provider]  glucose blood (ONE TOUCH ULTRA TEST) test strip Use to test blood sugar daily 02/29/16   Glean Hess, MD  HYDROcodone-acetaminophen (NORCO) 5-325 MG tablet Take 1 tablet by mouth every 6 (six) hours as needed for up to 15 doses for severe pain. 01/01/18   Darel Hong, MD  ibuprofen (ADVIL,MOTRIN) 600 MG tablet Take 1 tablet (600 mg total) by mouth every 8 (eight) hours as needed. 01/01/18   Darel Hong, MD  losartan (COZAAR) 50 MG tablet TAKE 1 TABLET DAILY 07/16/17   Glean Hess, MD  metFORMIN (GLUCOPHAGE) 500 MG tablet TAKE 1 TABLET TWICE A DAY 05/26/17   Glean Hess, MD  metFORMIN (GLUCOPHAGE) 500 MG tablet TAKE 1 TABLET TWICE A DAY 11/11/17   Glean Hess, MD  MULTIPLE VITAMIN PO Take 1 tablet by mouth daily.  [provider]  ondansetron (ZOFRAN ODT) 4 MG disintegrating tablet Take 1 tablet (4 mg total) by mouth every 8 (eight) hours as needed for nausea or vomiting. 01/01/18   Darel Hong, MD  [facility] DELICA LANCETS 75T MISC Use 1 each 2 (two) times daily. Extra fine 12/01/11   [provider]  potassium citrate (UROCIT-K) 10 MEQ (1080 MG) SR tablet Take 1 tablet (10 mEq total) by mouth 2 (two) times daily with a meal. 06/05/17   Stoioff, Ronda Fairly, MD  tamsulosin (FLOMAX) 0.4 MG CAPS capsule  Take 1 capsule (0.4 mg total) by mouth daily. 01/01/18   Darel Hong, MD    Allergies Ace inhibitors; Latex; and Tape  Family History  Problem Relation Age of Onset  . Cancer Mother        lung  . Diabetes Father   . Heart disease Father   . Breast cancer Maternal Aunt 75  . Kidney cancer Neg Hx   . Bladder Cancer Neg Hx     Social History Social History   Tobacco Use  . Smoking status: Never Smoker  . Smokeless tobacco: Never Used  Substance Use Topics  . Alcohol use: No    Alcohol/week: 0.0 standard drinks    Frequency: Never  . Drug use: No    Review of Systems Constitutional: No fever/chills Eyes: No visual changes. ENT: No sore throat. Cardiovascular: Denies chest pain. Respiratory: Denies shortness of breath. Gastrointestinal: Positive for abdominal pain.  No nausea, no vomiting.  No diarrhea.  No constipation. Genitourinary: Positive for frequency Musculoskeletal: Positive for for back pain. Skin: Negative for rash. Neurological: Negative for headaches, focal weakness or numbness.   ____________________________________________   PHYSICAL EXAM:  VITAL SIGNS: ED Triage Vitals  Enc Vitals Group     BP 12/31/17 2306 123/66     Pulse Rate 12/31/17 2306 87     Resp 12/31/17 2306 18     Temp 12/31/17 2306 97.8 F (36.6 C)     Temp Source 12/31/17 2306 Oral     SpO2 12/31/17 2306 97 %     Weight 12/31/17 2304 210 lb (95.3 kg)     Height 12/31/17 2304 5' (1.524 m)     Head Circumference --      Peak Flow --      Pain Score 12/31/17 2304 8     Pain Loc --      Pain Edu? --      Excl. in [place]? --     Constitutional: Alert and oriented x4 quite uncomfortable appearing although nontoxic no diaphoresis and speaks in full clear sentences Eyes: PERRL EOMI. Head: Atraumatic. Nose: No congestion/rhinnorhea. Mouth/Throat: No trismus Neck: No stridor.   Cardiovascular: Normal rate, regular rhythm. Grossly normal heart sounds.  Good peripheral  circulation. Respiratory: Normal respiratory effort.  No retractions. Lungs CTAB and moving good air Gastrointestinal: Soft nondistended nontender no rebound or guarding no peritonitis no costovertebral tenderness Musculoskeletal: No lower extremity edema   Neurologic:  Normal speech and language. No gross focal neurologic deficits are appreciated. Skin:  Skin is warm, dry and intact. No rash noted. Psychiatric: Mood and affect are normal. Speech and behavior are normal.    ____________________________________________   DIFFERENTIAL includes but not limited to  [facility] tract infection, pyelonephritis, nephrolithiasis, infected stone, pelvic inflammatory disease, biliary disease ____________________________________________   LABS (all labs ordered are listed, but only abnormal results are displayed)  Labs Reviewed  COMPREHENSIVE METABOLIC PANEL - Abnormal; Notable for the following  components:      Result Value   Glucose, Bld 222 (*)    BUN 26 (*)    All other components within normal limits  CBC - Abnormal; Notable for the following components:   WBC 12.0 (*)    All other components within normal limits  URINALYSIS, COMPLETE (UACMP) WITH MICROSCOPIC - Abnormal; Notable for the following components:   Color, Urine YELLOW (*)    APPearance HAZY (*)    Specific Gravity, Urine 1.032 (*)    Hgb urine dipstick MODERATE (*)    Protein, ur 100 (*)    Leukocytes, UA TRACE (*)    RBC / HPF >50 (*)    All other components within normal limits  LIPASE, BLOOD    Lab work reviewed by me with hematuria consistent with kidney stone.  Elevated white count is nonspecific and could be secondary to pain just as well as infection. __________________________________________  EKG   ____________________________________________  RADIOLOGY  CT stone protocol reviewed by me shows 5 mm stone at the distal right ureter with some  obstruction ____________________________________________   PROCEDURES  Procedure(s) performed: no  Procedures  Critical Care performed: no  ____________________________________________   INITIAL IMPRESSION / ASSESSMENT AND PLAN / ED COURSE  Pertinent labs & imaging results that were available during my care of the patient were reviewed by me and considered in my medical decision making (see chart for details).   As part of my medical decision making, I reviewed the following data within the [place] History obtained from family if available, nursing notes, old chart and ekg, as well as notes from prior ED visits.  Patient comes the emergency department with sudden onset colicky right-sided flank pain.  Urinalysis has blood in it.  Given 30 mg of intramuscular Toradol and 2 tablets of hydrocodone with near complete relief of her symptoms.  CT scan is consistent with distal right sided 5 mm kidney stone.  We discussed that a stone the size very well could pass on its own and I will give her first dose of Flomax here to help it pass.  We will discharge her home with Flomax, hydrocodone, Zofran, and ibuprofen.  Urology follow-up.  Strict return precautions have been given.      ____________________________________________   FINAL CLINICAL IMPRESSION(S) / ED DIAGNOSES  Final diagnoses:  Kidney stone      NEW MEDICATIONS STARTED DURING THIS VISIT:  Discharge Medication List as of 01/01/2018  1:54 AM    START taking these medications   Details  HYDROcodone-acetaminophen (NORCO) 5-325 MG tablet Take 1 tablet by mouth every 6 (six) hours as needed for up to 15 doses for severe pain., Starting Fri 01/01/2018, Print    ondansetron (ZOFRAN ODT) 4 MG disintegrating tablet Take 1 tablet (4 mg total) by mouth every 8 (eight) hours as needed for nausea or vomiting., Starting Fri 01/01/2018, Print    tamsulosin (FLOMAX) 0.4 MG CAPS capsule Take 1 capsule (0.4 mg  total) by mouth daily., Starting Fri 01/01/2018, Print         Note:  This document was prepared using Dragon voice recognition software and may include unintentional dictation errors.     Darel Hong, MD 01/03/18 [phone]

## 2018-01-12 ENCOUNTER — Other Ambulatory Visit: Payer: Self-pay | Admitting: Internal Medicine

## 2018-02-06 ENCOUNTER — Other Ambulatory Visit: Payer: Self-pay | Admitting: Internal Medicine

## 2018-02-07 ENCOUNTER — Other Ambulatory Visit: Payer: Self-pay | Admitting: Internal Medicine

## 2018-02-26 ENCOUNTER — Ambulatory Visit: Payer: BC Managed Care – PPO | Admitting: Internal Medicine

## 2018-02-26 ENCOUNTER — Encounter: Payer: Self-pay | Admitting: Internal Medicine

## 2018-02-26 VITALS — BP 126/60 | HR 90 | Ht 60.0 in | Wt 221.2 lb

## 2018-02-26 DIAGNOSIS — I10 Essential (primary) hypertension: Secondary | ICD-10-CM | POA: Diagnosis not present

## 2018-02-26 DIAGNOSIS — E1129 Type 2 diabetes mellitus with other diabetic kidney complication: Secondary | ICD-10-CM | POA: Diagnosis not present

## 2018-02-26 DIAGNOSIS — R809 Proteinuria, unspecified: Secondary | ICD-10-CM

## 2018-02-26 MED ORDER — POTASSIUM CITRATE ER 10 MEQ (1080 MG) PO TBCR: 10.0000 meq | EXTENDED_RELEASE_TABLET | Freq: Every day | ORAL | 3 refills | Status: DC

## 2018-02-26 MED ORDER — METFORMIN HCL 500 MG PO TABS: 500.0000 mg | ORAL_TABLET | Freq: Two times a day (BID) | ORAL | 3 refills | Status: DC

## 2018-02-26 MED ORDER — LOSARTAN POTASSIUM 50 MG PO TABS: 50.0000 mg | ORAL_TABLET | Freq: Every day | ORAL | 3 refills | Status: DC

## 2018-02-26 NOTE — Progress Notes (Signed)
Date:  02/26/2018   Name:  Brittany Forbes   DOB:  1960-11-17   MRN:  [id number]   Chief Complaint: Diabetes (Foot exam. A1C.); Hypertension; and Hyperlipidemia (Patient did not bring medicine bottles but she said she "thinks" she is taking 20mg  of lipitor daily.)  Diabetes  She presents for her follow-up diabetic visit. She has type 2 diabetes mellitus. Her disease course has been stable. Pertinent negatives for hypoglycemia include no headaches. Pertinent negatives for diabetes include no chest pain, no fatigue, no foot paresthesias, no polyphagia and no polyuria. Symptoms are stable. Current diabetic treatment includes oral agent (monotherapy). She is compliant with treatment all of the time. An ACE inhibitor/angiotensin II receptor blocker is being taken. Eye exam is current.  Hypertension  This is a chronic problem. The problem is controlled. Pertinent negatives include no chest pain, headaches, palpitations or shortness of breath. Past treatments include angiotensin blockers.   Lab Results  Component Value Date   HGBA1C 6.8 (H) 08/26/2017     Review of Systems  Constitutional: Negative for chills, fatigue and fever.  Respiratory: Negative for cough, chest tightness, shortness of breath and wheezing.   Cardiovascular: Negative for chest pain, palpitations and leg swelling.  Endocrine: Negative for polyphagia and polyuria.  Genitourinary: Negative for flank pain (resolved after she passed a stone last year), hematuria and urgency.  Neurological: Negative for numbness and headaches.    Patient Active Problem List   Diagnosis Date Noted  . 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 Nephrolithiasis 01/23/2017  . Microscopic hematuria 08/22/2016  . Type 2 diabetes mellitus with microalbuminuria, without long-term current use of insulin ([place]) 06/06/2014  . Essential (primary) hypertension 06/06/2014  . Hyperlipidemia associated with type 2 diabetes  mellitus ([place]) 06/06/2014  . Persistent proteinuria associated with type 2 diabetes mellitus ([place]) 06/06/2014  . Abnormal liver enzymes 09/12/2011  . History of parathyroidectomy 05/29/2011    Allergies  Allergen Reactions  . Ace Inhibitors     Cough  . Latex     Latex bandages cause a rash, paper tape is ok  . Tape     Paper tape ok    Past Surgical History:  Procedure Laterality Date  . IR NEPHROSTOMY PLACEMENT LEFT  01/23/2017  . KIDNEY STONE SURGERY Left [id number]  . NEPHROLITHOTOMY Left 01/23/2017   Procedure: NEPHROLITHOTOMY PERCUTANEOUS;  Surgeon: Abbie Sons, MD;  Location: [facility] ORS;  Service: Urology;  Laterality: Left;  . PARATHYROIDECTOMY  [id number]   [facility]  . TONSILLECTOMY      Social History   Tobacco Use  . Smoking status: Never Smoker  . Smokeless tobacco: Never Used  Substance Use Topics  . Alcohol use: No    Alcohol/week: 0.0 standard drinks    Frequency: Never  . Drug use: No     Medication list has been reviewed and updated.  Current Meds  Medication Sig  . atorvastatin (LIPITOR) 20 MG tablet TAKE 1 TABLET DAILY  . Ca Carbonate-Mag Hydroxide (ROLAIDS PO) Take 1 tablet by mouth daily as needed (acid reflux).  Marland Kitchen GLUCOSAMINE HCL-MSM PO Take 1-2 tablets by mouth 2 (two) times daily. Take 2 tablets (1000mg ) in the morning and 1 tablet (500mg ) in the evening  . glucose blood (ONE TOUCH ULTRA TEST) test strip Use to test blood sugar daily  . losartan (COZAAR) 50 MG tablet Take 1 tablet (50 mg total) by mouth daily.  . metFORMIN (GLUCOPHAGE) 500 MG tablet Take 1 tablet (500 mg  total) by mouth 2 (two) times daily.  . MULTIPLE VITAMIN PO Take 1 tablet by mouth daily.  Glory Rosebush DELICA LANCETS 63W MISC Use 1 each 2 (two) times daily. Extra fine  . potassium citrate (UROCIT-K) 10 MEQ (1080 MG) SR tablet Take 1 tablet (10 mEq total) by mouth daily.  . [DISCONTINUED] losartan (COZAAR) 50 MG tablet TAKE 1 TABLET DAILY  . [DISCONTINUED] metFORMIN (GLUCOPHAGE)  500 MG tablet TAKE 1 TABLET TWICE A DAY  . [DISCONTINUED] potassium citrate (UROCIT-K) 10 MEQ (1080 MG) SR tablet Take 1 tablet (10 mEq total) by mouth 2 (two) times daily with a meal. (Patient taking differently: Take 10 mEq by mouth daily. )    PHQ 2/9 Scores 02/26/2018 08/26/2017 08/22/2016 08/17/2015  PHQ - 2 Score 0 0 0 0    Physical Exam Vitals signs and nursing note reviewed.  Constitutional:      General: She is not in acute distress.    Appearance: She is well-developed.  HENT:     Head: Normocephalic and atraumatic.  Eyes:     Pupils: Pupils are equal, round, and reactive to light.  Neck:     Musculoskeletal: Normal range of motion and neck supple.  Cardiovascular:     Rate and Rhythm: Normal rate and regular rhythm.  Pulmonary:     Effort: Pulmonary effort is normal. No respiratory distress.     Breath sounds: Normal breath sounds.  Musculoskeletal: Normal range of motion.  Lymphadenopathy:     Cervical: No cervical adenopathy.  Skin:    General: Skin is warm and dry.     Findings: No rash.  Neurological:     Mental Status: She is alert and oriented to person, place, and time.  Psychiatric:        Behavior: Behavior normal.        Thought Content: Thought content normal.    Wt Readings from Last 3 Encounters:  02/26/18 221 lb 3.2 oz (100.3 kg)  12/31/17 210 lb (95.3 kg)  08/26/17 218 lb (98.9 kg)     BP 126/60   Pulse 90   Ht 5' (1.524 m)   Wt 221 lb 3.2 oz (100.3 kg)   SpO2 97%   BMI 43.20 kg/m   Assessment and Plan: 1. Type 2 diabetes mellitus with microalbuminuria, without long-term current use of insulin (HCC) Controlled, on oral agents Continue healthy diet and exericse - HgB A1c - metFORMIN (GLUCOPHAGE) 500 MG tablet; Take 1 tablet (500 mg total) by mouth 2 (two) times daily.  Dispense: 180 tablet; Refill: 3  2. Essential (primary) hypertension controlled - losartan (COZAAR) 50 MG tablet; Take 1 tablet (50 mg total) by mouth daily.  Dispense:  90 tablet; Refill: 3   Partially dictated using Editor, commissioning. Any errors are unintentional.  Halina Maidens, MD [facility]  02/26/2018

## 2018-02-27 LAB — HEMOGLOBIN A1C
Est. average glucose Bld gHb Est-mCnc: 154 mg/dL
HEMOGLOBIN A1C: 7 % — AB (ref 4.8–5.6)

## 2018-08-06 ENCOUNTER — Other Ambulatory Visit: Payer: Self-pay | Admitting: Internal Medicine

## 2018-08-30 ENCOUNTER — Encounter: Payer: BC Managed Care – PPO | Admitting: Internal Medicine

## 2018-09-02 ENCOUNTER — Other Ambulatory Visit: Payer: Self-pay | Admitting: Internal Medicine

## 2018-09-02 DIAGNOSIS — Z1231 Encounter for screening mammogram for malignant neoplasm of breast: Secondary | ICD-10-CM

## 2018-09-03 ENCOUNTER — Other Ambulatory Visit (HOSPITAL_COMMUNITY)
Admission: RE | Admit: 2018-09-03 | Discharge: 2018-09-03 | Disposition: A | Discharge status: Home / Self Care | Payer: BC Managed Care – PPO | Source: Ambulatory Visit | Attending: Internal Medicine | Admitting: Internal Medicine

## 2018-09-03 ENCOUNTER — Ambulatory Visit (INDEPENDENT_AMBULATORY_CARE_PROVIDER_SITE_OTHER): Payer: BC Managed Care – PPO | Admitting: Internal Medicine

## 2018-09-03 ENCOUNTER — Encounter: Payer: Self-pay | Admitting: Internal Medicine

## 2018-09-03 ENCOUNTER — Other Ambulatory Visit: Payer: Self-pay

## 2018-09-03 VITALS — BP 118/74 | HR 99 | Ht 60.0 in | Wt 218.0 lb

## 2018-09-03 DIAGNOSIS — E785 Hyperlipidemia, unspecified: Secondary | ICD-10-CM | POA: Diagnosis not present

## 2018-09-03 DIAGNOSIS — Z0000 Encounter for general adult medical examination without abnormal findings: Secondary | ICD-10-CM | POA: Diagnosis not present

## 2018-09-03 DIAGNOSIS — Z124 Encounter for screening for malignant neoplasm of cervix: Secondary | ICD-10-CM

## 2018-09-03 DIAGNOSIS — I10 Essential (primary) hypertension: Secondary | ICD-10-CM

## 2018-09-03 DIAGNOSIS — E118 Type 2 diabetes mellitus with unspecified complications: Secondary | ICD-10-CM

## 2018-09-03 DIAGNOSIS — E1169 Type 2 diabetes mellitus with other specified complication: Secondary | ICD-10-CM

## 2018-09-03 DIAGNOSIS — Z1231 Encounter for screening mammogram for malignant neoplasm of breast: Secondary | ICD-10-CM

## 2018-09-03 LAB — POCT URINALYSIS DIPSTICK
Bilirubin, UA: NEGATIVE
Blood, UA: NEGATIVE
Glucose, UA: NEGATIVE
Ketones, UA: NEGATIVE
Leukocytes, UA: NEGATIVE
Nitrite, UA: NEGATIVE
Protein, UA: NEGATIVE
Spec Grav, UA: 1.01 (ref 1.010–1.025)
Urobilinogen, UA: 0.2 E.U./dL
pH, UA: 5 (ref 5.0–8.0)

## 2018-09-03 NOTE — Progress Notes (Signed)
Date:  09/03/2018   Name:  Brittany Forbes   DOB:  1960-06-24   MRN:  [id number]   Chief Complaint: Annual Exam (Breast Exam and Pap. ) Brittany Forbes is a 58 y.o. female who presents today for her Complete Annual Exam. She feels well. She reports exercising regularly swimming and walking. She reports she is sleeping fairly well.   Mammogram - scheduled Pap - due Colonoscopy 2013 - repeat 2023  Diabetes She presents for her follow-up diabetic visit. She has type 2 diabetes mellitus. Her disease course has been stable. Pertinent negatives for hypoglycemia include no dizziness, headaches, nervousness/anxiousness or tremors. Pertinent negatives for diabetes include no chest pain, no fatigue, no polydipsia and no polyuria. Current diabetic treatment includes oral agent (monotherapy). She is compliant with treatment all of the time. Her weight is stable. She monitors blood glucose at home 3-4 x per week. Her breakfast blood glucose is taken between 6-7 am. Her breakfast blood glucose range is generally 130-140 mg/dl. An ACE inhibitor/angiotensin II receptor blocker is being taken. Eye exam is current.  Hypertension This is a chronic problem. The problem is controlled. Pertinent negatives include no chest pain, headaches, palpitations or shortness of breath. Past treatments include angiotensin blockers. The current treatment provides significant improvement.  Hyperlipidemia The problem is controlled. Recent lipid tests were reviewed and are normal. Pertinent negatives include no chest pain or shortness of breath. Current antihyperlipidemic treatment includes statins. The current treatment provides significant improvement of lipids.   Lab Results  Component Value Date   HGBA1C 7.0 (H) 02/26/2018   Lab Results  Component Value Date   CREATININE 0.71 12/31/2017   BUN 26 (H) 12/31/2017   NA 137 12/31/2017   K 3.8 12/31/2017   CL 103 12/31/2017   CO2 25 12/31/2017   Lab Results  Component  Value Date   CHOL 172 08/26/2017   HDL 57 08/26/2017   LDLCALC 89 08/26/2017   TRIG 129 08/26/2017   CHOLHDL 3.0 08/26/2017     Review of Systems  Constitutional: Negative for chills, fatigue and fever.  HENT: Negative for congestion, hearing loss, tinnitus, trouble swallowing and voice change.   Eyes: Negative for visual disturbance.  Respiratory: Negative for cough, chest tightness, shortness of breath and wheezing.   Cardiovascular: Negative for chest pain, palpitations and leg swelling.  Gastrointestinal: Negative for abdominal pain, constipation, diarrhea and vomiting.  Endocrine: Negative for polydipsia and polyuria.  Genitourinary: Negative for dysuria, frequency, genital sores, vaginal bleeding and vaginal discharge.  Musculoskeletal: Negative for arthralgias, gait problem and joint swelling.  Skin: Negative for color change and rash.  Neurological: Negative for dizziness, tremors, light-headedness and headaches.  Hematological: Negative for adenopathy. Does not bruise/bleed easily.  Psychiatric/Behavioral: Negative for dysphoric mood and sleep disturbance. The patient is not nervous/anxious.     Patient Active Problem List   Diagnosis Date Noted  . 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 Nephrolithiasis 01/23/2017  . Microscopic hematuria 08/22/2016  . Type II diabetes mellitus with complication ([place]) 34/19/6222  . Essential (primary) hypertension 06/06/2014  . Hyperlipidemia associated with type 2 diabetes mellitus ([place]) 06/06/2014  . Persistent proteinuria associated with type 2 diabetes mellitus ([place]) 06/06/2014  . Abnormal liver enzymes 09/12/2011  . Status post parathyroidectomy 05/29/2011    Allergies  Allergen Reactions  . Ace Inhibitors     Cough  . Latex     Latex bandages cause a rash, paper tape is ok  . Tape  Paper tape ok    Past Surgical History:  Procedure Laterality Date  . IR NEPHROSTOMY PLACEMENT LEFT   01/23/2017  . KIDNEY STONE SURGERY Left [id number]  . NEPHROLITHOTOMY Left 01/23/2017   Procedure: NEPHROLITHOTOMY PERCUTANEOUS;  Surgeon: Abbie Sons, MD;  Location: [facility] ORS;  Service: Urology;  Laterality: Left;  . PARATHYROIDECTOMY  [id number]   [facility]  . TONSILLECTOMY      Social History   Tobacco Use  . Smoking status: Never Smoker  . Smokeless tobacco: Never Used  Substance Use Topics  . Alcohol use: No    Alcohol/week: 0.0 standard drinks    Frequency: Never  . Drug use: No     Medication list has been reviewed and updated.  Current Meds  Medication Sig  . atorvastatin (LIPITOR) 20 MG tablet TAKE 1 TABLET DAILY  . Ca Carbonate-Mag Hydroxide (ROLAIDS PO) Take 1 tablet by mouth daily as needed (acid reflux).  Marland Kitchen GLUCOSAMINE HCL-MSM PO Take 1-2 tablets by mouth 2 (two) times daily. Take 2 tablets (1000mg ) in the morning and 1 tablet (500mg ) in the evening  . glucose blood (ONE TOUCH ULTRA TEST) test strip Use to test blood sugar daily  . losartan (COZAAR) 50 MG tablet Take 1 tablet (50 mg total) by mouth daily.  . metFORMIN (GLUCOPHAGE) 500 MG tablet Take 1 tablet (500 mg total) by mouth 2 (two) times daily.  . MULTIPLE VITAMIN PO Take 1 tablet by mouth daily.  Glory Rosebush DELICA LANCETS 59R MISC Use 1 each 2 (two) times daily. Extra fine  . potassium citrate (UROCIT-K) 10 MEQ (1080 MG) SR tablet Take 1 tablet (10 mEq total) by mouth daily.    PHQ 2/9 Scores 09/03/2018 02/26/2018 08/26/2017 08/22/2016  PHQ - 2 Score 0 0 0 0    BP Readings from Last 3 Encounters:  09/03/18 118/74  02/26/18 126/60  01/01/18 129/68    Physical Exam Vitals signs and nursing note reviewed.  Constitutional:      General: She is not in acute distress.    Appearance: She is well-developed.  HENT:     Head: Normocephalic and atraumatic.     Right Ear: Tympanic membrane and ear canal normal.     Left Ear: Tympanic membrane and ear canal normal.     Nose:     Right Sinus: No maxillary sinus  tenderness.     Left Sinus: No maxillary sinus tenderness.  Eyes:     General: No scleral icterus.       Right eye: No discharge.        Left eye: No discharge.     Conjunctiva/sclera: Conjunctivae normal.  Neck:     Musculoskeletal: Normal range of motion. No erythema.     Thyroid: No thyromegaly.     Vascular: No carotid bruit.  Cardiovascular:     Rate and Rhythm: Normal rate and regular rhythm.     Pulses: Normal pulses.     Heart sounds: Normal heart sounds.  Pulmonary:     Effort: Pulmonary effort is normal. No respiratory distress.     Breath sounds: No wheezing.  Chest:     Breasts:        Right: No mass, nipple discharge, skin change or tenderness.        Left: No mass, nipple discharge, skin change or tenderness.  Abdominal:     General: Bowel sounds are normal.     Palpations: Abdomen is soft.     Tenderness: There is no abdominal  tenderness.     Hernia: There is no hernia in the left inguinal area or right inguinal area.  Genitourinary:    Labia:        Right: No tenderness, lesion or injury.        Left: No tenderness, lesion or injury.      Vagina: Normal.     Cervix: Normal.     Uterus: Normal.      Adnexa: Right adnexa normal and left adnexa normal.     Comments: Pap with HPV obtained Musculoskeletal: Normal range of motion.  Lymphadenopathy:     Cervical: No cervical adenopathy.  Skin:    General: Skin is warm and dry.     Findings: No rash.  Neurological:     Mental Status: She is alert and oriented to person, place, and time.     Cranial Nerves: No cranial nerve deficit.     Sensory: No sensory deficit.     Deep Tendon Reflexes: Reflexes are normal and symmetric.  Psychiatric:        Attention and Perception: Attention normal.        Speech: Speech normal.        Behavior: Behavior normal.        Thought Content: Thought content normal.        Cognition and Memory: Cognition normal.     Wt Readings from Last 3 Encounters:  09/03/18 218 lb  (98.9 kg)  02/26/18 221 lb 3.2 oz (100.3 kg)  12/31/17 210 lb (95.3 kg)    BP 118/74   Pulse 99   Ht 5' (1.524 m)   Wt 218 lb (98.9 kg)   SpO2 97%   BMI 42.58 kg/m   Assessment and Plan: 1. Annual physical exam Normal exam except for weight Continue to work on diet and weight loss with exercise - POCT urinalysis dipstick  2. Encounter for screening mammogram for breast cancer scheduled  3. Encounter for Papanicolaou smear for cervical cancer screening Pap obtained - if normal/negative will discontinue routine screening - Cytology - PAP  4. Essential (primary) hypertension controlled - CBC with Differential/Platelet - TSH  5. Hyperlipidemia associated with type 2 diabetes mellitus ([place]) On statin therapy - Lipid panel  6. Type II diabetes mellitus with complication (HCC) stable - Comprehensive metabolic panel - Hemoglobin A1c   Partially dictated using Editor, commissioning. Any errors are unintentional.  Halina Maidens, MD [facility]  09/03/2018

## 2018-09-04 LAB — CBC WITH DIFFERENTIAL/PLATELET
Basophils Absolute: 0.1 10*3/uL (ref 0.0–0.2)
Basos: 1 %
EOS (ABSOLUTE): 0.1 10*3/uL (ref 0.0–0.4)
Eos: 1 %
Hematocrit: 40.1 % (ref 34.0–46.6)
Hemoglobin: 13.4 g/dL (ref 11.1–15.9)
Immature Grans (Abs): 0 10*3/uL (ref 0.0–0.1)
Immature Granulocytes: 0 %
Lymphocytes Absolute: 4.2 10*3/uL — ABNORMAL HIGH (ref 0.7–3.1)
Lymphs: 35 %
MCH: 26.9 pg (ref 26.6–33.0)
MCHC: 33.4 g/dL (ref 31.5–35.7)
MCV: 81 fL (ref 79–97)
Monocytes Absolute: 0.7 10*3/uL (ref 0.1–0.9)
Monocytes: 6 %
Neutrophils Absolute: 6.7 10*3/uL (ref 1.4–7.0)
Neutrophils: 57 %
Platelets: 313 10*3/uL (ref 150–450)
RBC: 4.98 x10E6/uL (ref 3.77–5.28)
RDW: 13.5 % (ref 11.7–15.4)
WBC: 11.9 10*3/uL — ABNORMAL HIGH (ref 3.4–10.8)

## 2018-09-04 LAB — COMPREHENSIVE METABOLIC PANEL
ALT: 23 IU/L (ref 0–32)
AST: 21 IU/L (ref 0–40)
Albumin/Globulin Ratio: 2.1 (ref 1.2–2.2)
Albumin: 4.9 g/dL (ref 3.8–4.9)
Alkaline Phosphatase: 126 IU/L — ABNORMAL HIGH (ref 39–117)
BUN/Creatinine Ratio: 21 (ref 12–28)
BUN: 13 mg/dL (ref 8–27)
Bilirubin Total: 0.3 mg/dL (ref 0.0–1.2)
CO2: 21 mmol/L (ref 20–29)
Calcium: 10.1 mg/dL (ref 8.7–10.3)
Chloride: 99 mmol/L (ref 96–106)
Creatinine, Ser: 0.63 mg/dL (ref 0.57–1.00)
GFR calc Af Amer: 113 mL/min/{1.73_m2} (ref >59)
GFR calc non Af Amer: 98 mL/min/{1.73_m2} (ref >59)
Globulin, Total: 2.3 g/dL (ref 1.5–4.5)
Glucose: 109 mg/dL — ABNORMAL HIGH (ref 65–99)
Potassium: 4.1 mmol/L (ref 3.5–5.2)
Sodium: 138 mmol/L (ref 134–144)
Total Protein: 7.2 g/dL (ref 6.0–8.5)

## 2018-09-04 LAB — HEMOGLOBIN A1C
Est. average glucose Bld gHb Est-mCnc: 160 mg/dL
Hgb A1c MFr Bld: 7.2 % — ABNORMAL HIGH (ref 4.8–5.6)

## 2018-09-04 LAB — LIPID PANEL
Chol/HDL Ratio: 2.8 ratio (ref 0.0–4.4)
Cholesterol, Total: 168 mg/dL (ref 100–199)
HDL: 60 mg/dL (ref >39)
LDL Calculated: 80 mg/dL (ref 0–99)
Triglycerides: 140 mg/dL (ref 0–149)
VLDL Cholesterol Cal: 28 mg/dL (ref 5–40)

## 2018-09-04 LAB — TSH: TSH: 1.27 u[IU]/mL (ref 0.450–4.500)

## 2018-09-06 LAB — CYTOLOGY - PAP: Diagnosis: NEGATIVE

## 2018-09-06 NOTE — Progress Notes (Signed)
Patient informed and told me she seen already on my chart.

## 2018-10-13 ENCOUNTER — Ambulatory Visit
Admission: RE | Admit: 2018-10-13 | Discharge: 2018-10-13 | Disposition: A | Discharge status: Home / Self Care | Payer: BC Managed Care – PPO | Source: Ambulatory Visit | Attending: Internal Medicine | Admitting: Internal Medicine

## 2018-10-13 DIAGNOSIS — Z1231 Encounter for screening mammogram for malignant neoplasm of breast: Secondary | ICD-10-CM | POA: Insufficient documentation

## 2018-10-18 ENCOUNTER — Encounter: Payer: Self-pay | Admitting: Internal Medicine

## 2018-11-24 LAB — HM DIABETES EYE EXAM

## 2018-11-25 ENCOUNTER — Encounter: Payer: Self-pay | Admitting: Internal Medicine

## 2018-12-14 ENCOUNTER — Other Ambulatory Visit: Payer: Self-pay

## 2018-12-14 MED ORDER — ONETOUCH DELICA LANCETS 33G MISC: 12 refills | Status: DC

## 2018-12-27 ENCOUNTER — Telehealth: Payer: Self-pay

## 2018-12-27 IMAGING — XA DG C-ARM GT 120 MIN
13 series · 13 of 13 positions shown · non-contrast
Comparison: None.

CLINICAL DATA: Percutaneous nephrolithotomy

EXAM:
DG C-ARM GT 120 MIN; DG NEPHROSTOGRAM EXISTING ACCESS LEFT

[Series 1: cont. · 1 of 1 slices shown (1 of 13)]
[im 1/1]
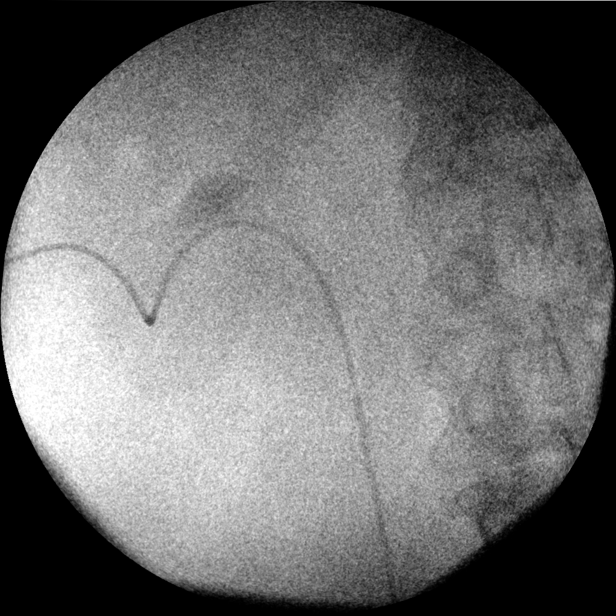

[Series 2: cont. · 1 of 1 slices shown (2 of 13)]
[im 1/1]
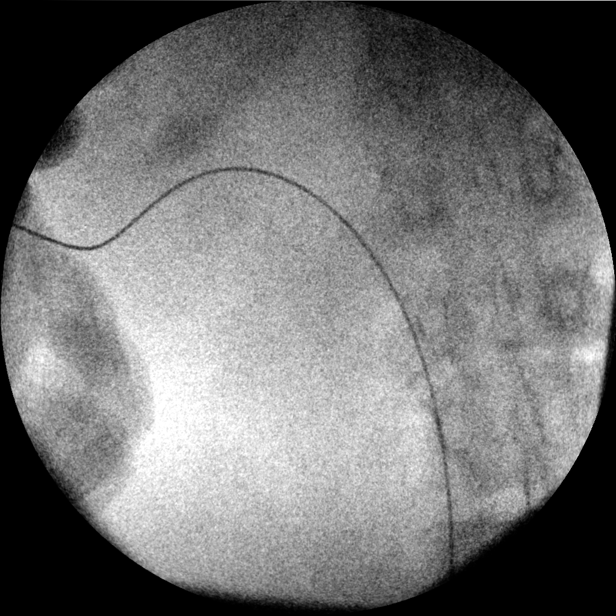

[Series 3: cont. · 1 of 1 slices shown (3 of 13)]
[im 1/1]
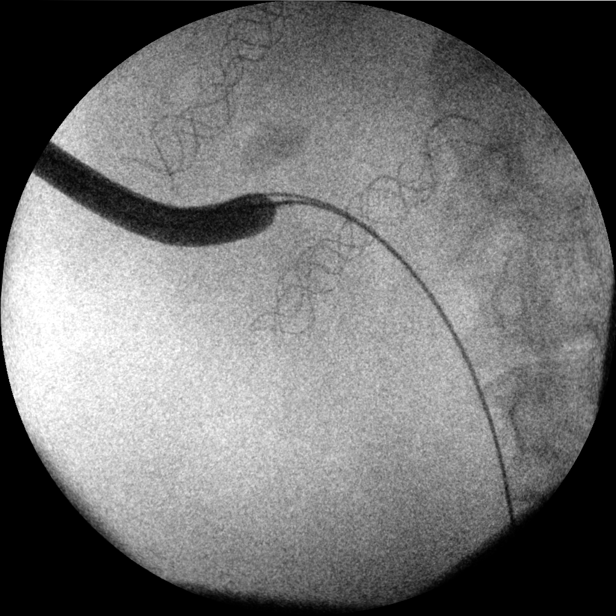

[Series 4: cont. · 1 of 1 slices shown (4 of 13)]
[im 1/1]
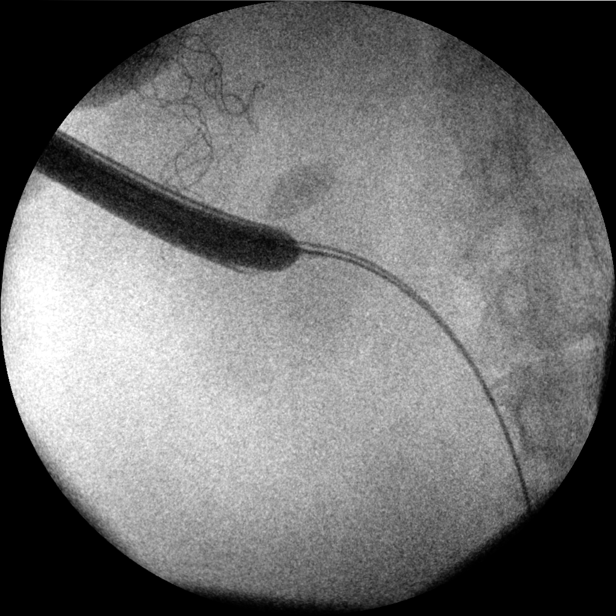

[Series 5: cont. · 1 of 1 slices shown (5 of 13)]
[im 1/1]
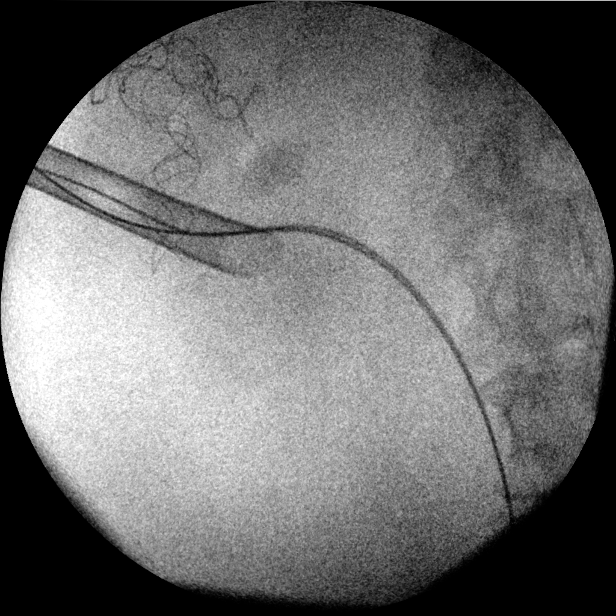

[Series 6: cont. · 1 of 1 slices shown (6 of 13)]
[im 1/1]
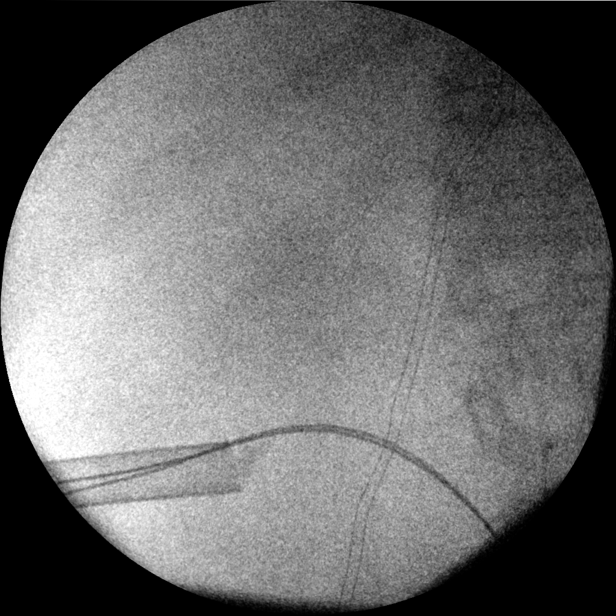

[Series 7: cont. · 1 of 1 slices shown (7 of 13)]
[im 1/1]
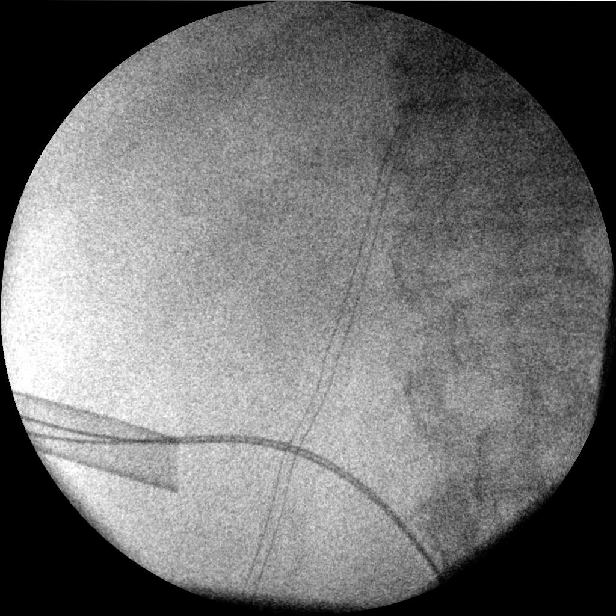

[Series 8: cont. · 1 of 1 slices shown (8 of 13)]
[im 1/1]
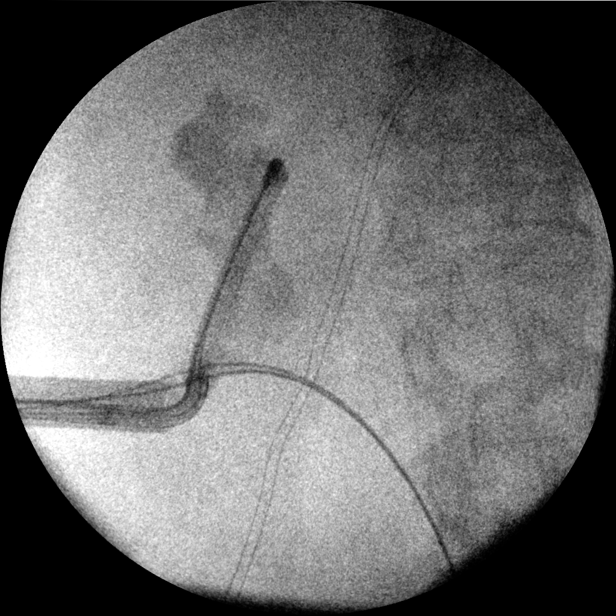

[Series 9: cont. · 1 of 1 slices shown (9 of 13)]
[im 1/1]
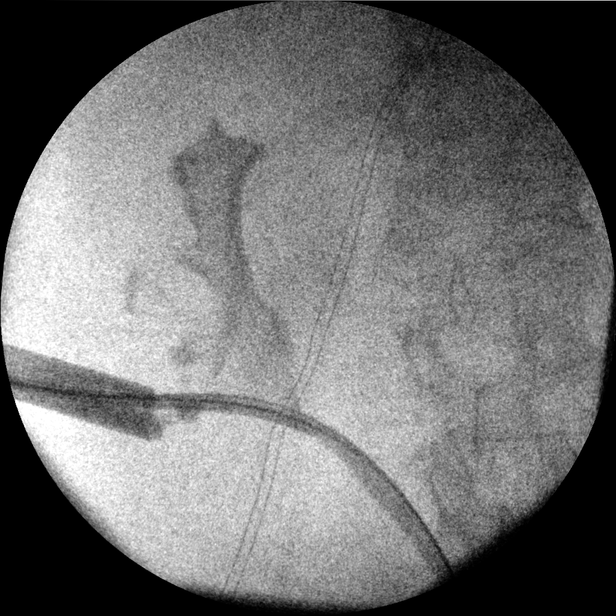

[Series 10: cont. · 1 of 1 slices shown (10 of 13)]
[im 1/1]
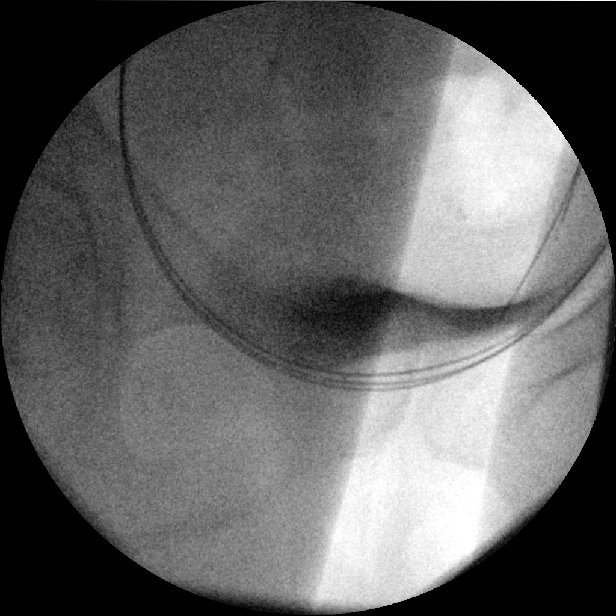

[Series 11: cont. · 1 of 1 slices shown (11 of 13)]
[im 1/1]
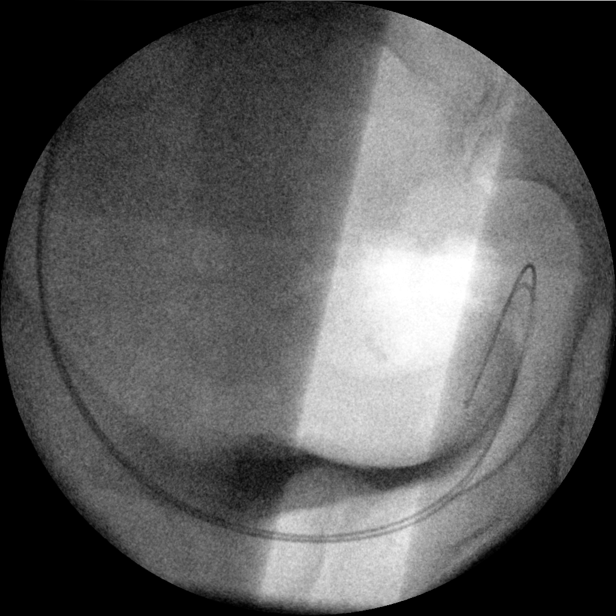

[Series 12: cont. · 1 of 1 slices shown (12 of 13)]
[im 1/1]
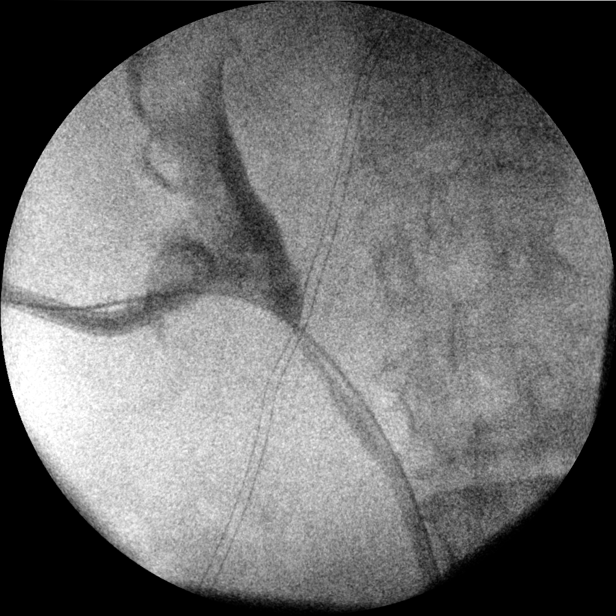

[Series 13: cont. · 1 of 1 slices shown (13 of 13)]
[im 1/1]
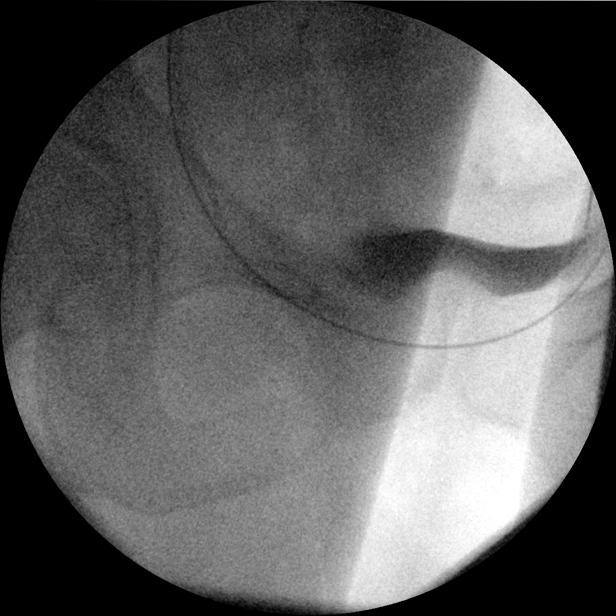

[13 of 13 positions shown; findings below may reference images not displayed]

FINDINGS: Multiple intraoperative spot images demonstrate exchange of
indwelling catheter for track master sheath. Opacification of
decompressed left collecting system. Probable filling defect
originally in the midpole calyx, not definitively seen on final
images.
IMPRESSION: Nephrolithotomy as above.

## 2018-12-27 NOTE — Telephone Encounter (Signed)
Pt called stating she tested positive for Covid yesterday. She has nasal congestion which feels like a possible sinus infection. She was advised by the school nurse to contact Dr. Army Melia regarding meds to take OTC.  Spoke with Dr. Army Melia and informed patient to try Sudafed or Flonase nasal spray to help.  She verbalized understanding of this.   Benedict Needy, CMA

## 2018-12-28 ENCOUNTER — Ambulatory Visit: Payer: BC Managed Care – PPO | Admitting: Internal Medicine

## 2019-01-25 ENCOUNTER — Other Ambulatory Visit: Payer: Self-pay | Admitting: Internal Medicine

## 2019-02-07 ENCOUNTER — Telehealth: Payer: Self-pay | Admitting: Internal Medicine

## 2019-02-07 ENCOUNTER — Other Ambulatory Visit: Payer: Self-pay | Admitting: Internal Medicine

## 2019-02-07 DIAGNOSIS — E1129 Type 2 diabetes mellitus with other diabetic kidney complication: Secondary | ICD-10-CM

## 2019-02-07 MED ORDER — METFORMIN HCL 500 MG PO TABS: 500.0000 mg | ORAL_TABLET | Freq: Two times a day (BID) | ORAL | 0 refills | Status: DC

## 2019-02-07 NOTE — Telephone Encounter (Signed)
Pt called to reschedule her DM f/u for this Wednesday at 3:20, is out of medication want to have a some called in to [facility] on [address] till she gets her medication in the mail   metFORMIN (GLUCOPHAGE) 500 MG tablet [id number]

## 2019-02-07 NOTE — Telephone Encounter (Signed)
Why hasn't she rescheduled her appt?  It needs to be soon.  Also why can't I just renew the mail order Rx?  It is due soon anyway.

## 2019-02-08 ENCOUNTER — Other Ambulatory Visit: Payer: Self-pay | Admitting: Internal Medicine

## 2019-02-08 DIAGNOSIS — E1129 Type 2 diabetes mellitus with other diabetic kidney complication: Secondary | ICD-10-CM

## 2019-02-08 DIAGNOSIS — R809 Proteinuria, unspecified: Secondary | ICD-10-CM

## 2019-02-09 ENCOUNTER — Encounter: Payer: Self-pay | Admitting: Internal Medicine

## 2019-02-09 ENCOUNTER — Other Ambulatory Visit: Payer: Self-pay

## 2019-02-09 ENCOUNTER — Ambulatory Visit (INDEPENDENT_AMBULATORY_CARE_PROVIDER_SITE_OTHER): Payer: BC Managed Care – PPO | Admitting: Internal Medicine

## 2019-02-09 VITALS — BP 124/82 | HR 99 | Temp 98.1°F | Resp 16 | Ht 60.0 in | Wt 219.0 lb

## 2019-02-09 DIAGNOSIS — I10 Essential (primary) hypertension: Secondary | ICD-10-CM

## 2019-02-09 DIAGNOSIS — R809 Proteinuria, unspecified: Secondary | ICD-10-CM

## 2019-02-09 DIAGNOSIS — E1129 Type 2 diabetes mellitus with other diabetic kidney complication: Secondary | ICD-10-CM | POA: Diagnosis not present

## 2019-02-09 MED ORDER — LOSARTAN POTASSIUM 50 MG PO TABS: 50.0000 mg | ORAL_TABLET | Freq: Every day | ORAL | 3 refills | Status: DC

## 2019-02-09 MED ORDER — METFORMIN HCL 500 MG PO TABS: 500.0000 mg | ORAL_TABLET | Freq: Three times a day (TID) | ORAL | 3 refills | Status: DC

## 2019-02-09 NOTE — Progress Notes (Signed)
Date:  02/09/2019   Name:  Brittany Forbes   DOB:  [birth year]   MRN:  [id number]   Chief Complaint: Diabetes (BS 150-170) and Hypertension Covid-19 - patient reports that she had covid infection in November.  She was not very ill but stayed home and did not antibiotics or steroids.  She feels that she has recovered 95% - still has mild pulmonary symptoms. Diabetes She presents for her follow-up diabetic visit. She has type 2 diabetes mellitus. Her disease course has been stable. Pertinent negatives for hypoglycemia include no headaches or tremors. Pertinent negatives for diabetes include no chest pain, no fatigue, no polydipsia and no polyuria. Current diabetic treatment includes oral agent (monotherapy). She is compliant with treatment all of the time. Her weight is stable. She monitors blood glucose at home 1-2 x per day. There is no change in her home blood glucose trend. Her breakfast blood glucose is taken between 7-8 am. Her breakfast blood glucose range is generally 140-180 mg/dl. An ACE inhibitor/angiotensin II receptor blocker is being taken.  Hypertension This is a chronic problem. The problem is controlled. Pertinent negatives include no chest pain, headaches, palpitations or shortness of breath. Past treatments include angiotensin blockers. The current treatment provides significant improvement. There are no compliance problems.     Lab Results  Component Value Date   CREATININE 0.63 09/03/2018   BUN 13 09/03/2018   NA 138 09/03/2018   K 4.1 09/03/2018   CL 99 09/03/2018   CO2 21 09/03/2018   Lab Results  Component Value Date   CHOL 168 09/03/2018   HDL 60 09/03/2018   LDLCALC 80 09/03/2018   TRIG 140 09/03/2018   CHOLHDL 2.8 09/03/2018   Lab Results  Component Value Date   TSH 1.270 09/03/2018   Lab Results  Component Value Date   HGBA1C 7.2 (H) 09/03/2018     Review of Systems  Constitutional: Negative for appetite change, fatigue, fever and unexpected  weight change.  HENT: Negative for tinnitus and trouble swallowing.   Eyes: Negative for visual disturbance.  Respiratory: Negative for cough, chest tightness and shortness of breath.   Cardiovascular: Negative for chest pain, palpitations and leg swelling.  Gastrointestinal: Negative for abdominal pain.  Endocrine: Negative for polydipsia and polyuria.  Genitourinary: Negative for dysuria and hematuria.  Musculoskeletal: Negative for arthralgias.  Neurological: Negative for tremors, numbness and headaches.  Psychiatric/Behavioral: Negative for dysphoric mood.    Patient Active Problem List   Diagnosis Date Noted  . 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 Nephrolithiasis 01/23/2017  . Microscopic hematuria 08/22/2016  . Type II diabetes mellitus with complication ([place]) 123456  . Essential (primary) hypertension 06/06/2014  . Hyperlipidemia associated with type 2 diabetes mellitus ([place]) 06/06/2014  . Persistent proteinuria associated with type 2 diabetes mellitus ([place]) 06/06/2014  . Abnormal liver enzymes 09/12/2011  . Status post parathyroidectomy 05/29/2011    Allergies  Allergen Reactions  . Ace Inhibitors     Cough  . Latex     Latex bandages cause a rash, paper tape is ok  . Tape     Paper tape ok    Past Surgical History:  Procedure Laterality Date  . IR NEPHROSTOMY PLACEMENT LEFT  01/23/2017  . KIDNEY STONE SURGERY Left [id number]  . NEPHROLITHOTOMY Left 01/23/2017   Procedure: NEPHROLITHOTOMY PERCUTANEOUS;  Surgeon: Abbie Sons, MD;  Location: [facility] ORS;  Service: Urology;  Laterality: Left;  . PARATHYROIDECTOMY  [id number]   [facility]  .  TONSILLECTOMY      Social History   Tobacco Use  . Smoking status: Never Smoker  . Smokeless tobacco: Never Used  Substance Use Topics  . Alcohol use: No    Alcohol/week: 0.0 standard drinks  . Drug use: No     Medication list has been reviewed and updated.  Current Meds  Medication Sig  .  atorvastatin (LIPITOR) 20 MG tablet TAKE 1 TABLET DAILY  . Ca Carbonate-Mag Hydroxide (ROLAIDS PO) Take 1 tablet by mouth daily as needed (acid reflux).  Marland Kitchen GLUCOSAMINE HCL-MSM PO Take 1-2 tablets by mouth 2 (two) times daily. Take 2 tablets (1000mg ) in the morning and 1 tablet (500mg ) in the evening  . glucose blood (ONE TOUCH ULTRA TEST) test strip Use to test blood sugar daily  . losartan (COZAAR) 50 MG tablet Take 1 tablet (50 mg total) by mouth daily.  . metFORMIN (GLUCOPHAGE) 500 MG tablet Take 1 tablet (500 mg total) by mouth 3 (three) times daily.  . MULTIPLE VITAMIN PO Take 1 tablet by mouth daily.  Glory Rosebush Delica Lancets [id number] MISC Use 1 each 2 (two) times daily to test blood sugar. Extra fine  . potassium citrate (UROCIT-K) 10 MEQ (1080 MG) SR tablet Take 1 tablet (10 mEq total) by mouth daily. (Patient taking differently: Take 10 mEq by mouth every other day. )  . [DISCONTINUED] losartan (COZAAR) 50 MG tablet Take 1 tablet (50 mg total) by mouth daily.  . [DISCONTINUED] metFORMIN (GLUCOPHAGE) 500 MG tablet Take 1 tablet (500 mg total) by mouth 2 (two) times daily with a meal. (Patient taking differently: Take 500 mg by mouth 3 (three) times daily with meals. )  . [DISCONTINUED] metFORMIN (GLUCOPHAGE) 500 MG tablet Take 500 mg by mouth 3 (three) times daily.    PHQ 2/9 Scores 02/09/2019 09/03/2018 02/26/2018 08/26/2017  PHQ - 2 Score 0 0 0 0    BP Readings from Last 3 Encounters:  02/09/19 124/82  09/03/18 118/74  02/26/18 126/60    Physical Exam Vitals and nursing note reviewed.  Constitutional:      General: She is not in acute distress.    Appearance: Normal appearance. She is well-developed.  HENT:     Head: Normocephalic and atraumatic.  Cardiovascular:     Rate and Rhythm: Normal rate and regular rhythm.     Pulses: Normal pulses.  Pulmonary:     Effort: Pulmonary effort is normal. No respiratory distress.     Breath sounds: No wheezing or rhonchi.  Musculoskeletal:      Cervical back: Normal range of motion.     Right lower leg: No edema.     Left lower leg: No edema.  Lymphadenopathy:     Cervical: No cervical adenopathy.  Skin:    General: Skin is warm and dry.     Findings: No rash.  Neurological:     General: No focal deficit present.     Mental Status: She is alert and oriented to person, place, and time.  Psychiatric:        Behavior: Behavior normal.        Thought Content: Thought content normal.     Wt Readings from Last 3 Encounters:  02/09/19 219 lb (99.3 kg)  09/03/18 218 lb (98.9 kg)  02/26/18 221 lb 3.2 oz (100.3 kg)    BP 124/82   Pulse 99   Temp 98.1 F (36.7 C)   Resp 16   Ht 5' (1.524 m)   Wt 219  lb (99.3 kg)   SpO2 99%   BMI 42.77 kg/m   Assessment and Plan: 1. Type 2 diabetes mellitus with microalbuminuria, without long-term current use of insulin (HCC) Clinically stable without much change in fingerstick BS since increasing metformin.  Will check labs then advise. - metFORMIN (GLUCOPHAGE) 500 MG tablet; Take 1 tablet (500 mg total) by mouth 3 (three) times daily.  Dispense: 270 tablet; Refill: 3 - Hemoglobin 123456 - Basic metabolic panel  2. Essential (primary) hypertension Clinically stable exam with well controlled BP.   Tolerating medications, losartan 50 mg, without side effects at this time. Pt to continue current regimen and low sodium diet; benefits of regular exercise as able discussed. - losartan (COZAAR) 50 MG tablet; Take 1 tablet (50 mg total) by mouth daily.  Dispense: 90 tablet; Refill: 3   Partially dictated using Editor, commissioning. Any errors are unintentional.  Halina Maidens, MD [facility]  02/09/2019

## 2019-02-10 LAB — BASIC METABOLIC PANEL
BUN/Creatinine Ratio: 26 (ref 12–28)
BUN: 17 mg/dL (ref 8–27)
CO2: 23 mmol/L (ref 20–29)
Calcium: 10 mg/dL (ref 8.7–10.3)
Chloride: 99 mmol/L (ref 96–106)
Creatinine, Ser: 0.66 mg/dL (ref 0.57–1.00)
GFR calc Af Amer: 111 mL/min/{1.73_m2} (ref >59)
GFR calc non Af Amer: 96 mL/min/{1.73_m2} (ref >59)
Glucose: 100 mg/dL — ABNORMAL HIGH (ref 65–99)
Potassium: 4.1 mmol/L (ref 3.5–5.2)
Sodium: 139 mmol/L (ref 134–144)

## 2019-02-10 LAB — HEMOGLOBIN A1C
Est. average glucose Bld gHb Est-mCnc: 151 mg/dL
Hgb A1c MFr Bld: 6.9 % — ABNORMAL HIGH (ref 4.8–5.6)

## 2019-03-02 ENCOUNTER — Other Ambulatory Visit: Payer: Self-pay | Admitting: Internal Medicine

## 2019-03-02 DIAGNOSIS — E1129 Type 2 diabetes mellitus with other diabetic kidney complication: Secondary | ICD-10-CM

## 2019-03-02 DIAGNOSIS — R809 Proteinuria, unspecified: Secondary | ICD-10-CM

## 2019-04-09 ENCOUNTER — Ambulatory Visit: Payer: BC Managed Care – PPO | Attending: Internal Medicine

## 2019-04-09 DIAGNOSIS — Z23 Encounter for immunization: Secondary | ICD-10-CM | POA: Insufficient documentation

## 2019-04-09 NOTE — Progress Notes (Signed)
   Covid-19 Vaccination Clinic  Name:  Brittany Forbes    MRN: [id number] DOB: 08-14-1960  04/09/2019  [name] was observed post Covid-19 immunization for 15 minutes without incidence. She was provided with Vaccine Information Sheet and instruction to access the V-Safe system.   [name] was instructed to call 911 with any severe reactions post vaccine: Marland Kitchen Difficulty breathing  . Swelling of your face and throat  . A fast heartbeat  . A bad rash all over your body  . Dizziness and weakness    Immunizations Administered    Name Date Dose VIS Date Route   Moderna COVID-19 Vaccine 04/09/2019  3:29 PM 0.5 mL 01/11/2019 Intramuscular   Manufacturer: Moderna   Lot: [id number]   [id number]

## 2019-05-07 ENCOUNTER — Ambulatory Visit: Payer: BC Managed Care – PPO | Attending: Internal Medicine

## 2019-05-07 DIAGNOSIS — Z23 Encounter for immunization: Secondary | ICD-10-CM

## 2019-05-07 NOTE — Progress Notes (Signed)
   Covid-19 Vaccination Clinic  Name:  Brittany Forbes    MRN: [id number] DOB: [birth year]  05/07/2019  [name] was observed post Covid-19 immunization for 15 minutes without incident. She was provided with Vaccine Information Sheet and instruction to access the V-Safe system.   [name] was instructed to call 911 with any severe reactions post vaccine: Marland Kitchen Difficulty breathing  . Swelling of face and throat  . A fast heartbeat  . A bad rash all over body  . Dizziness and weakness   Immunizations Administered    Name Date Dose VIS Date Route   Moderna COVID-19 Vaccine 05/07/2019 12:37 PM 0.5 mL 01/11/2019 Intramuscular   Manufacturer: Levan Hurst   LotUD:[id number]   [id number]

## 2019-09-05 ENCOUNTER — Encounter: Payer: Self-pay | Admitting: Internal Medicine

## 2019-09-05 ENCOUNTER — Other Ambulatory Visit: Payer: Self-pay

## 2019-09-05 ENCOUNTER — Ambulatory Visit (INDEPENDENT_AMBULATORY_CARE_PROVIDER_SITE_OTHER): Payer: BC Managed Care – PPO | Admitting: Internal Medicine

## 2019-09-05 VITALS — BP 130/76 | HR 96 | Temp 98.0°F | Ht 60.0 in | Wt 219.0 lb

## 2019-09-05 DIAGNOSIS — Z0000 Encounter for general adult medical examination without abnormal findings: Secondary | ICD-10-CM

## 2019-09-05 DIAGNOSIS — E1169 Type 2 diabetes mellitus with other specified complication: Secondary | ICD-10-CM

## 2019-09-05 DIAGNOSIS — E785 Hyperlipidemia, unspecified: Secondary | ICD-10-CM

## 2019-09-05 DIAGNOSIS — Z1231 Encounter for screening mammogram for malignant neoplasm of breast: Secondary | ICD-10-CM | POA: Diagnosis not present

## 2019-09-05 DIAGNOSIS — I10 Essential (primary) hypertension: Secondary | ICD-10-CM

## 2019-09-05 DIAGNOSIS — E118 Type 2 diabetes mellitus with unspecified complications: Secondary | ICD-10-CM | POA: Diagnosis not present

## 2019-09-05 LAB — POCT URINALYSIS DIPSTICK
Bilirubin, UA: NEGATIVE
Blood, UA: NEGATIVE
Glucose, UA: NEGATIVE
Ketones, UA: NEGATIVE
Leukocytes, UA: NEGATIVE
Nitrite, UA: NEGATIVE
Protein, UA: NEGATIVE
Spec Grav, UA: <=1.005 — AB (ref 1.010–1.025)
Urobilinogen, UA: 0.2 E.U./dL
pH, UA: 5 (ref 5.0–8.0)

## 2019-09-05 NOTE — Progress Notes (Signed)
Date:  09/05/2019   Name:  Lyann Hagstrom   DOB:  10/30/1960   MRN:  [id number]   Chief Complaint: Annual Exam (breast exam no pap, foot exam)  Brittany Forbes is a 59 y.o. female who presents today for her Complete Annual Exam. She feels fairly well. She reports exercising walking and swimming when she can. She reports she is sleeping well. Breast complaints none.  Mammogram: 10/2018 DEXA: none Pap smear: 08/2018 thin prep Colonoscopy: 01/2012 at [facility] normal  Immunization History  Administered Date(s) Administered   Influenza,inj,Quad PF,6+ Mos 12/20/2014, 10/09/2018   Influenza-Unspecified 11/22/2015, 11/29/2016, 11/16/2017   Moderna SARS-COVID-2 Vaccination 04/09/2019, 05/07/2019   Pneumococcal Polysaccharide-23 02/11/2011   Tdap 01/11/2011   Zoster 05/18/2013    Hypertension This is a chronic problem. The problem is controlled. Pertinent negatives include no chest pain, headaches, palpitations or shortness of breath. Past treatments include angiotensin blockers. The current treatment provides significant improvement. There is no history of kidney disease or CAD/MI.  Diabetes She presents for her follow-up diabetic visit. She has type 2 diabetes mellitus. Her disease course has been fluctuating. Pertinent negatives for hypoglycemia include no dizziness, headaches, nervousness/anxiousness or tremors. Pertinent negatives for diabetes include no chest pain, no fatigue, no polydipsia and no polyuria. Current diabetic treatment includes oral agent (monotherapy). She is compliant with treatment all of the time. An ACE inhibitor/angiotensin II receptor blocker is being taken. Eye exam is current.  Hyperlipidemia The problem is controlled. Pertinent negatives include no chest pain or shortness of breath. Current antihyperlipidemic treatment includes statins. The current treatment provides significant improvement of lipids.    Lab Results  Component Value Date   CREATININE 0.66  02/09/2019   BUN 17 02/09/2019   NA 139 02/09/2019   K 4.1 02/09/2019   CL 99 02/09/2019   CO2 23 02/09/2019   Lab Results  Component Value Date   CHOL 168 09/03/2018   HDL 60 09/03/2018   LDLCALC 80 09/03/2018   TRIG 140 09/03/2018   CHOLHDL 2.8 09/03/2018   Lab Results  Component Value Date   TSH 1.270 09/03/2018   Lab Results  Component Value Date   HGBA1C 6.9 (H) 02/09/2019   Lab Results  Component Value Date   WBC 11.9 (H) 09/03/2018   HGB 13.4 09/03/2018   HCT 40.1 09/03/2018   MCV 81 09/03/2018   PLT 313 09/03/2018   Lab Results  Component Value Date   ALT 23 09/03/2018   AST 21 09/03/2018   ALKPHOS 126 (H) 09/03/2018   BILITOT 0.3 09/03/2018     Review of Systems  Constitutional: Negative for chills, fatigue and fever.  HENT: Negative for congestion, hearing loss, tinnitus, trouble swallowing and voice change.   Eyes: Negative for visual disturbance.  Respiratory: Negative for cough, chest tightness, shortness of breath and wheezing.   Cardiovascular: Negative for chest pain, palpitations and leg swelling.  Gastrointestinal: Negative for abdominal pain, constipation, diarrhea and vomiting.  Endocrine: Negative for polydipsia and polyuria.  Genitourinary: Negative for dysuria, frequency, genital sores, vaginal bleeding and vaginal discharge.  Musculoskeletal: Negative for arthralgias, gait problem and joint swelling.  Skin: Negative for color change and rash.  Neurological: Negative for dizziness, tremors, light-headedness and headaches.  Hematological: Negative for adenopathy. Does not bruise/bleed easily.  Psychiatric/Behavioral: Negative for dysphoric mood and sleep disturbance. The patient is not nervous/anxious.     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  Nephrolithiasis 01/23/2017   Microscopic hematuria 08/22/2016   Type II diabetes mellitus with complication  ([place]) 41/96/2229   Essential (primary) hypertension 06/06/2014   Hyperlipidemia associated with type 2 diabetes mellitus ([place]) 06/06/2014   Persistent proteinuria associated with type 2 diabetes mellitus ([place]) 06/06/2014   Abnormal liver enzymes 09/12/2011   Status post parathyroidectomy 05/29/2011    Allergies  Allergen Reactions   Ace Inhibitors     Cough   Latex     Latex bandages cause a rash, paper tape is ok   Tape     Paper tape ok    Past Surgical History:  Procedure Laterality Date   IR NEPHROSTOMY PLACEMENT LEFT  01/23/2017   KIDNEY STONE SURGERY Left [id number]   NEPHROLITHOTOMY Left 01/23/2017   Procedure: NEPHROLITHOTOMY PERCUTANEOUS;  Surgeon: Abbie Sons, MD;  Location: [facility] ORS;  Service: Urology;  Laterality: Left;   PARATHYROIDECTOMY  [id number]   [facility]   TONSILLECTOMY      Social History   Tobacco Use   Smoking status: Never Smoker   Smokeless tobacco: Never Used  Vaping Use   Vaping Use: Never used  Substance Use Topics   Alcohol use: No    Alcohol/week: 0.0 standard drinks   Drug use: No     Medication list has been reviewed and updated.  Current Meds  Medication Sig   atorvastatin (LIPITOR) 20 MG tablet TAKE 1 TABLET DAILY   Ca Carbonate-Mag Hydroxide (ROLAIDS PO) Take 1 tablet by mouth daily as needed (acid reflux).   GLUCOSAMINE HCL-MSM PO Take 1-2 tablets by mouth 2 (two) times daily. Take 2 tablets (1000mg ) in the morning and 1 tablet (500mg ) in the evening   glucose blood (ONE TOUCH ULTRA TEST) test strip Use to test blood sugar daily   losartan (COZAAR) 50 MG tablet Take 1 tablet (50 mg total) by mouth daily.   metFORMIN (GLUCOPHAGE) 500 MG tablet TAKE 1 TABLET (500 MG TOTAL) BY MOUTH 2 (TWO) TIMES DAILY WITH A MEAL.   MULTIPLE VITAMIN PO Take 1 tablet by mouth daily.   OneTouch Delica Lancets 19E MISC Use 1 each 2 (two) times daily to test blood sugar. Extra fine   potassium citrate (UROCIT-K) 10 MEQ (1080 MG)  SR tablet Take 1 tablet (10 mEq total) by mouth daily. (Patient taking differently: Take 10 mEq by mouth every other day. )    PHQ 2/9 Scores 09/05/2019 02/09/2019 09/03/2018 02/26/2018  PHQ - 2 Score 0 0 0 0  PHQ- 9 Score 0 - - -    GAD 7 : Generalized Anxiety Score 09/05/2019  Nervous, Anxious, on Edge 0  Control/stop worrying 0  Worry too much - different things 0  Trouble relaxing 0  Restless 0  Easily annoyed or irritable 0  Afraid - awful might happen 0  Total GAD 7 Score 0  Anxiety Difficulty Not difficult at all    BP Readings from Last 3 Encounters:  09/05/19 (!) 130/76  02/09/19 124/82  09/03/18 118/74    Physical Exam Vitals and nursing note reviewed.  Constitutional:      General: She is not in acute distress.    Appearance: She is well-developed.  HENT:     Head: Normocephalic and atraumatic.     Right Ear: Tympanic membrane and ear canal normal.     Left Ear: Tympanic membrane and ear canal normal.     Nose:     Right Sinus: No maxillary sinus tenderness.     Left Sinus:  No maxillary sinus tenderness.  Eyes:     General: No scleral icterus.       Right eye: No discharge.        Left eye: No discharge.     Conjunctiva/sclera: Conjunctivae normal.  Neck:     Thyroid: No thyromegaly.     Vascular: No carotid bruit.  Cardiovascular:     Rate and Rhythm: Normal rate and regular rhythm.     Pulses: Normal pulses.     Heart sounds: Normal heart sounds.  Pulmonary:     Effort: Pulmonary effort is normal. No respiratory distress.     Breath sounds: No wheezing.  Chest:     Breasts:        Right: No mass, nipple discharge, skin change or tenderness.        Left: No mass, nipple discharge, skin change or tenderness.  Abdominal:     General: Bowel sounds are normal.     Palpations: Abdomen is soft.     Tenderness: There is no abdominal tenderness.  Musculoskeletal:     Cervical back: Normal range of motion. No erythema.     Right lower leg: No edema.      Left lower leg: No edema.  Lymphadenopathy:     Cervical: No cervical adenopathy.  Skin:    General: Skin is warm and dry.     Findings: No rash.  Neurological:     Mental Status: She is alert and oriented to person, place, and time.     Cranial Nerves: No cranial nerve deficit.     Sensory: No sensory deficit.     Deep Tendon Reflexes: Reflexes are normal and symmetric.  Psychiatric:        Attention and Perception: Attention normal.        Mood and Affect: Mood normal.     Wt Readings from Last 3 Encounters:  09/05/19 (!) 219 lb (99.3 kg)  02/09/19 219 lb (99.3 kg)  09/03/18 218 lb (98.9 kg)    BP (!) 130/76    Pulse 96    Temp 98 F (36.7 C) (Oral)    Ht 5' (1.524 m)    Wt (!) 219 lb (99.3 kg)    SpO2 97%    BMI 42.77 kg/m   Assessment and Plan: 1. Annual physical exam Exam is normal except for weight. Encourage regular exercise and appropriate dietary changes. - POCT urinalysis dipstick  2. Encounter for screening mammogram for breast cancer To be scheduled at [place]; Future  3. Type II diabetes mellitus with complication (HCC) Clinically stable by exam and report without s/s of hypoglycemia. DM complicated by HTN. Tolerating medications well without side effects or other concerns. - Comprehensive metabolic panel - Hemoglobin A1c  4. Hyperlipidemia associated with type 2 diabetes mellitus ([place]) Tolerating statin medication without side effects at this time LDL is at goal of < 70 on current dose Continue same therapy without change at this time. - Lipid panel  5. Essential (primary) hypertension Clinically stable exam with well controlled BP on losartan. Tolerating medications without side effects at this time. Pt to continue current regimen and low sodium diet; benefits of regular exercise as able discussed. - CBC with Differential/Platelet - TSH   Partially dictated using Editor, commissioning. Any errors are  unintentional.  Halina Maidens, MD [facility]  09/05/2019

## 2019-09-06 LAB — HEMOGLOBIN A1C
Est. average glucose Bld gHb Est-mCnc: 160 mg/dL
Hgb A1c MFr Bld: 7.2 % — ABNORMAL HIGH (ref 4.8–5.6)

## 2019-09-06 LAB — CBC WITH DIFFERENTIAL/PLATELET
Basophils Absolute: 0.1 10*3/uL (ref 0.0–0.2)
Basos: 1 %
EOS (ABSOLUTE): 0.3 10*3/uL (ref 0.0–0.4)
Eos: 3 %
Hematocrit: 37.2 % (ref 34.0–46.6)
Hemoglobin: 12.1 g/dL (ref 11.1–15.9)
Immature Grans (Abs): 0 10*3/uL (ref 0.0–0.1)
Immature Granulocytes: 0 %
Lymphocytes Absolute: 2.8 10*3/uL (ref 0.7–3.1)
Lymphs: 32 %
MCH: 26.2 pg — ABNORMAL LOW (ref 26.6–33.0)
MCHC: 32.5 g/dL (ref 31.5–35.7)
MCV: 81 fL (ref 79–97)
Monocytes Absolute: 0.6 10*3/uL (ref 0.1–0.9)
Monocytes: 6 %
Neutrophils Absolute: 5.3 10*3/uL (ref 1.4–7.0)
Neutrophils: 58 %
Platelets: 328 10*3/uL (ref 150–450)
RBC: 4.62 x10E6/uL (ref 3.77–5.28)
RDW: 14.5 % (ref 11.7–15.4)
WBC: 9 10*3/uL (ref 3.4–10.8)

## 2019-09-06 LAB — COMPREHENSIVE METABOLIC PANEL WITH GFR
ALT: 18 [IU]/L (ref 0–32)
AST: 14 [IU]/L (ref 0–40)
Albumin/Globulin Ratio: 1.9 (ref 1.2–2.2)
Albumin: 4.5 g/dL (ref 3.8–4.8)
Alkaline Phosphatase: 122 [IU]/L — ABNORMAL HIGH (ref 48–121)
BUN/Creatinine Ratio: 22 (ref 12–28)
BUN: 12 mg/dL (ref 8–27)
Bilirubin Total: 0.3 mg/dL (ref 0.0–1.2)
CO2: 22 mmol/L (ref 20–29)
Calcium: 9.6 mg/dL (ref 8.7–10.3)
Chloride: 100 mmol/L (ref 96–106)
Creatinine, Ser: 0.55 mg/dL — ABNORMAL LOW (ref 0.57–1.00)
GFR calc Af Amer: 117 mL/min/{1.73_m2}
GFR calc non Af Amer: 102 mL/min/{1.73_m2}
Globulin, Total: 2.4 g/dL (ref 1.5–4.5)
Glucose: 119 mg/dL — ABNORMAL HIGH (ref 65–99)
Potassium: 4.3 mmol/L (ref 3.5–5.2)
Sodium: 138 mmol/L (ref 134–144)
Total Protein: 6.9 g/dL (ref 6.0–8.5)

## 2019-09-06 LAB — LIPID PANEL
Chol/HDL Ratio: 2.9 ratio (ref 0.0–4.4)
Cholesterol, Total: 161 mg/dL (ref 100–199)
HDL: 56 mg/dL
LDL Chol Calc (NIH): 82 mg/dL (ref 0–99)
Triglycerides: 133 mg/dL (ref 0–149)
VLDL Cholesterol Cal: 23 mg/dL (ref 5–40)

## 2019-09-06 LAB — TSH: TSH: 1.04 u[IU]/mL (ref 0.450–4.500)

## 2019-09-20 ENCOUNTER — Other Ambulatory Visit: Payer: Self-pay

## 2019-09-20 ENCOUNTER — Ambulatory Visit (INDEPENDENT_AMBULATORY_CARE_PROVIDER_SITE_OTHER): Payer: BC Managed Care – PPO

## 2019-09-20 DIAGNOSIS — Z23 Encounter for immunization: Secondary | ICD-10-CM | POA: Diagnosis not present

## 2019-10-25 ENCOUNTER — Ambulatory Visit
Admission: RE | Admit: 2019-10-25 | Discharge: 2019-10-25 | Disposition: A | Discharge status: Home / Self Care | Payer: BC Managed Care – PPO | Source: Ambulatory Visit | Attending: Internal Medicine | Admitting: Internal Medicine

## 2019-10-25 ENCOUNTER — Other Ambulatory Visit: Payer: Self-pay

## 2019-10-25 DIAGNOSIS — Z1231 Encounter for screening mammogram for malignant neoplasm of breast: Secondary | ICD-10-CM | POA: Insufficient documentation

## 2019-10-28 ENCOUNTER — Other Ambulatory Visit: Payer: Self-pay | Admitting: Internal Medicine

## 2019-10-28 NOTE — Telephone Encounter (Signed)
Pt has not been seen since Dec 2020?

## 2019-10-28 NOTE — Telephone Encounter (Signed)
Requested medication (s) are due for refill today: yes  Requested medication (s) are on the active medication list: yes  Last refill:  02/26/2018  Future visit scheduled: yes  Notes to clinic:  Patient reports taking every other day. Should the Sig be changed. Could not find documentation that frequency changed    Requested Prescriptions  Pending Prescriptions Disp Refills   potassium citrate (UROCIT-K) 10 MEQ (1080 MG) SR tablet [Pharmacy Med Name: POT CITRATE  TAB 10MEQ] 90 tablet 3    Sig: TAKE 1 TABLET DAILY      Endocrinology:  Minerals - Potassium Citrate Failed - 10/28/2019  9:33 AM      Failed - Cr in normal range and within 120 days    Creatinine, Ser  Date Value Ref Range Status  09/05/2019 0.55 (L) 0.57 - 1.00 mg/dL Final          Passed - CO2 in normal range and within 120 days    CO2  Date Value Ref Range Status  09/05/2019 22 20 - 29 mmol/L Final          Passed - Cl in normal range and within 120 days    Chloride  Date Value Ref Range Status  09/05/2019 100 96 - 106 mmol/L Final          Passed - K in normal range and within 120 days    Potassium  Date Value Ref Range Status  09/05/2019 4.3 3.5 - 5.2 mmol/L Final          Passed - Na in normal range and within 120 days    Sodium  Date Value Ref Range Status  09/05/2019 138 134 - 144 mmol/L Final          Passed - WBC in normal range and within 120 days    WBC  Date Value Ref Range Status  09/05/2019 9.0 3.4 - 10.8 x10E3/uL Final  12/31/2017 12.0 (H) 4.0 - 10.5 K/uL Final          Passed - RBC in normal range and within 120 days    RBC  Date Value Ref Range Status  09/05/2019 4.62 3.77 - 5.28 x10E6/uL Final  12/31/2017 4.47 3.87 - 5.11 MIL/uL Final          Passed - HGB in normal range and within 120 days    Hemoglobin  Date Value Ref Range Status  09/05/2019 12.1 11.1 - 15.9 g/dL Final          Passed - HCT in normal range and within 120 days    Hematocrit  Date Value Ref  Range Status  09/05/2019 37.2 34.0 - 46.6 % Final          Passed - PLT in normal range and within 120 days    Platelets  Date Value Ref Range Status  09/05/2019 328 150 - 450 x10E3/uL Final          Passed - Valid encounter within last 12 months    Recent Outpatient Visits           1 month ago Annual physical exam   [facility] Glean Hess, MD   8 months ago Type 2 diabetes mellitus with microalbuminuria, without long-term current use of insulin [facility])   [facility] Glean Hess, MD   1 year ago Annual physical exam   [facility] Glean Hess, MD   1 year ago Type 2 diabetes mellitus with microalbuminuria, without  long-term current use of insulin [facility])   [facility] Glean Hess, MD   2 years ago Annual physical exam   [facility] Glean Hess, MD       Future Appointments             In 1 month Army Melia Jesse Sans, MD [facility], [place]   In 10 months Army Melia Jesse Sans, MD [facility], [facility]

## 2019-12-22 ENCOUNTER — Encounter: Payer: Self-pay | Admitting: Internal Medicine

## 2019-12-22 ENCOUNTER — Other Ambulatory Visit: Payer: Self-pay

## 2019-12-22 ENCOUNTER — Ambulatory Visit: Payer: BC Managed Care – PPO

## 2019-12-22 ENCOUNTER — Ambulatory Visit: Payer: BC Managed Care – PPO | Admitting: Internal Medicine

## 2019-12-22 VITALS — BP 130/72 | HR 87 | Ht 60.0 in | Wt 215.0 lb

## 2019-12-22 DIAGNOSIS — E118 Type 2 diabetes mellitus with unspecified complications: Secondary | ICD-10-CM

## 2019-12-22 DIAGNOSIS — R809 Proteinuria, unspecified: Secondary | ICD-10-CM

## 2019-12-22 DIAGNOSIS — Z23 Encounter for immunization: Secondary | ICD-10-CM | POA: Diagnosis not present

## 2019-12-22 DIAGNOSIS — E1129 Type 2 diabetes mellitus with other diabetic kidney complication: Secondary | ICD-10-CM

## 2019-12-22 DIAGNOSIS — I10 Essential (primary) hypertension: Secondary | ICD-10-CM | POA: Diagnosis not present

## 2019-12-22 DIAGNOSIS — E785 Hyperlipidemia, unspecified: Secondary | ICD-10-CM

## 2019-12-22 DIAGNOSIS — E892 Postprocedural hypoparathyroidism: Secondary | ICD-10-CM

## 2019-12-22 DIAGNOSIS — E1169 Type 2 diabetes mellitus with other specified complication: Secondary | ICD-10-CM

## 2019-12-22 NOTE — Progress Notes (Signed)
Date:  12/22/2019   Name:  Brittany Forbes   DOB:  Jun 04, 1960   MRN:  [id number]   Chief Complaint: Hypertension, Diabetes, and Immunizations (Shingrix #2)  Hypertension This is a chronic problem. The problem is controlled. Pertinent negatives include no chest pain, headaches, palpitations or shortness of breath. Past treatments include angiotensin blockers. The current treatment provides significant improvement. Hypertensive end-organ damage includes kidney disease (proteinuria). There is no history of CAD/MI or CVA.  Diabetes She presents for her follow-up diabetic visit. She has type 2 diabetes mellitus. Her disease course has been stable. Pertinent negatives for hypoglycemia include no headaches or tremors. Pertinent negatives for diabetes include no chest pain, no fatigue, no polydipsia and no polyuria. Pertinent negatives for diabetic complications include no CVA. Current diabetic treatment includes oral agent (monotherapy) (metformin). An ACE inhibitor/angiotensin II receptor blocker is being taken.    Lab Results  Component Value Date   CREATININE 0.55 (L) 09/05/2019   BUN 12 09/05/2019   NA 138 09/05/2019   K 4.3 09/05/2019   CL 100 09/05/2019   CO2 22 09/05/2019   Lab Results  Component Value Date   CHOL 161 09/05/2019   HDL 56 09/05/2019   LDLCALC 82 09/05/2019   TRIG 133 09/05/2019   CHOLHDL 2.9 09/05/2019   Lab Results  Component Value Date   TSH 1.040 09/05/2019   Lab Results  Component Value Date   HGBA1C 7.2 (H) 09/05/2019   Lab Results  Component Value Date   WBC 9.0 09/05/2019   HGB 12.1 09/05/2019   HCT 37.2 09/05/2019   MCV 81 09/05/2019   PLT 328 09/05/2019   Lab Results  Component Value Date   ALT 18 09/05/2019   AST 14 09/05/2019   ALKPHOS 122 (H) 09/05/2019   BILITOT 0.3 09/05/2019     Review of Systems  Constitutional: Negative for appetite change, fatigue, fever and unexpected weight change.  HENT: Negative for tinnitus and  trouble swallowing.   Eyes: Negative for visual disturbance.  Respiratory: Negative for cough, chest tightness and shortness of breath.   Cardiovascular: Negative for chest pain, palpitations and leg swelling.  Gastrointestinal: Negative for abdominal pain.  Endocrine: Negative for polydipsia and polyuria.  Genitourinary: Negative for dysuria and hematuria.  Musculoskeletal: Negative for arthralgias.  Neurological: Negative for tremors, numbness and headaches.  Psychiatric/Behavioral: Negative for dysphoric mood.    Patient Active Problem List   Diagnosis Date Noted  . 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 Nephrolithiasis 01/23/2017  . Microscopic hematuria 08/22/2016  . Type II diabetes mellitus with complication ([place]) 67/61/9509  . Essential (primary) hypertension 06/06/2014  . Hyperlipidemia associated with type 2 diabetes mellitus ([place]) 06/06/2014  . Persistent proteinuria associated with type 2 diabetes mellitus ([place]) 06/06/2014  . Abnormal liver enzymes 09/12/2011  . Status post parathyroidectomy 05/29/2011    Allergies  Allergen Reactions  . Ace Inhibitors     Cough  . Latex     Latex bandages cause a rash, paper tape is ok  . Tape     Paper tape ok    Past Surgical History:  Procedure Laterality Date  . IR NEPHROSTOMY PLACEMENT LEFT  01/23/2017  . KIDNEY STONE SURGERY Left [id number]  . NEPHROLITHOTOMY Left 01/23/2017   Procedure: NEPHROLITHOTOMY PERCUTANEOUS;  Surgeon: Abbie Sons, MD;  Location: [facility] ORS;  Service: Urology;  Laterality: Left;  . PARATHYROIDECTOMY  [id number]   [facility]  . TONSILLECTOMY      Social  History   Tobacco Use  . Smoking status: Never Smoker  . Smokeless tobacco: Never Used  Vaping Use  . Vaping Use: Never used  Substance Use Topics  . Alcohol use: No    Alcohol/week: 0.0 standard drinks  . Drug use: No     Medication list has been reviewed and updated.  Current Meds  Medication Sig  .  atorvastatin (LIPITOR) 20 MG tablet TAKE 1 TABLET DAILY  . Ca Carbonate-Mag Hydroxide (ROLAIDS PO) Take 1 tablet by mouth daily as needed (acid reflux).  Marland Kitchen GLUCOSAMINE HCL-MSM PO Take 1-2 tablets by mouth 2 (two) times daily. Take 2 tablets (1000mg ) in the morning and 1 tablet (500mg ) in the evening  . glucose blood (ONE TOUCH ULTRA TEST) test strip Use to test blood sugar daily  . losartan (COZAAR) 50 MG tablet Take 1 tablet (50 mg total) by mouth daily.  . metFORMIN (GLUCOPHAGE) 500 MG tablet TAKE 1 TABLET (500 MG TOTAL) BY MOUTH 2 (TWO) TIMES DAILY WITH A MEAL.  . MULTIPLE VITAMIN PO Take 1 tablet by mouth daily.  Glory Rosebush Delica Lancets 81O MISC Use 1 each 2 (two) times daily to test blood sugar. Extra fine  . potassium citrate (UROCIT-K) 10 MEQ (1080 MG) SR tablet TAKE 1 TABLET DAILY    PHQ 2/9 Scores 12/22/2019 09/05/2019 02/09/2019 09/03/2018  PHQ - 2 Score 2 0 0 0  PHQ- 9 Score 2 0 - -    GAD 7 : Generalized Anxiety Score 12/22/2019 09/05/2019  Nervous, Anxious, on Edge 2 0  Control/stop worrying 2 0  Worry too much - different things 2 0  Trouble relaxing 0 0  Restless 0 0  Easily annoyed or irritable 2 0  Afraid - awful might happen 0 0  Total GAD 7 Score 8 0  Anxiety Difficulty Not difficult at all Not difficult at all    BP Readings from Last 3 Encounters:  12/22/19 130/72  09/05/19 (!) 130/76  02/09/19 124/82    Physical Exam Vitals and nursing note reviewed.  Constitutional:      General: She is not in acute distress.    Appearance: Normal appearance. She is well-developed.  HENT:     Head: Normocephalic and atraumatic.  Neck:     Vascular: No carotid bruit.  Cardiovascular:     Rate and Rhythm: Normal rate and regular rhythm.     Pulses: Normal pulses.     Heart sounds: No murmur heard.   Pulmonary:     Effort: Pulmonary effort is normal. No respiratory distress.     Breath sounds: No wheezing or rhonchi.  Musculoskeletal:     Cervical back: Normal  range of motion.     Right lower leg: No edema.     Left lower leg: No edema.  Lymphadenopathy:     Cervical: No cervical adenopathy.  Skin:    General: Skin is warm and dry.     Findings: No rash.  Neurological:     General: No focal deficit present.     Mental Status: She is alert and oriented to person, place, and time.  Psychiatric:        Mood and Affect: Mood normal.     Wt Readings from Last 3 Encounters:  12/22/19 215 lb (97.5 kg)  09/05/19 (!) 219 lb (99.3 kg)  02/09/19 219 lb (99.3 kg)    BP 130/72   Pulse 87   Ht 5' (1.524 m)   Wt 215 lb (97.5 kg)  SpO2 97%   BMI 41.99 kg/m   Assessment and Plan: 1. Type II diabetes mellitus with complication (HCC) Clinically stable by exam and report without s/s of hypoglycemia. DM complicated by HTN and Lipids. Tolerating medications - metformin -  well without side effects or other concerns. - Hemoglobin A1c  2. Hyperlipidemia associated with type 2 diabetes mellitus ([place]) On appropriate statin therapy  3. Essential (primary) hypertension BP controlled  On ARB therapy  4. Persistent proteinuria associated with type 2 diabetes mellitus ([place]) On ARB therapy  5. History of parathyroidectomy ([place]) Recent calcium was normal No longer being followed by endocrinology.   Partially dictated using Editor, commissioning. Any errors are unintentional.  Halina Maidens, MD [facility]  12/22/2019

## 2019-12-23 LAB — HEMOGLOBIN A1C
Est. average glucose Bld gHb Est-mCnc: 166 mg/dL
Hgb A1c MFr Bld: 7.4 % — ABNORMAL HIGH (ref 4.8–5.6)

## 2019-12-26 LAB — HM DIABETES EYE EXAM

## 2020-01-13 ENCOUNTER — Other Ambulatory Visit: Payer: Self-pay | Admitting: Internal Medicine

## 2020-02-04 ENCOUNTER — Other Ambulatory Visit: Payer: Self-pay | Admitting: Internal Medicine

## 2020-02-04 DIAGNOSIS — R809 Proteinuria, unspecified: Secondary | ICD-10-CM

## 2020-03-12 ENCOUNTER — Other Ambulatory Visit: Payer: Self-pay | Admitting: Internal Medicine

## 2020-03-12 DIAGNOSIS — I10 Essential (primary) hypertension: Secondary | ICD-10-CM

## 2020-04-12 ENCOUNTER — Other Ambulatory Visit: Payer: Self-pay | Admitting: Internal Medicine

## 2020-04-16 ENCOUNTER — Ambulatory Visit (INDEPENDENT_AMBULATORY_CARE_PROVIDER_SITE_OTHER): Payer: BC Managed Care – PPO | Admitting: Internal Medicine

## 2020-04-16 ENCOUNTER — Encounter: Payer: Self-pay | Admitting: Internal Medicine

## 2020-04-16 ENCOUNTER — Other Ambulatory Visit: Payer: Self-pay

## 2020-04-16 VITALS — BP 134/70 | HR 96 | Ht 60.0 in | Wt 218.0 lb

## 2020-04-16 DIAGNOSIS — E118 Type 2 diabetes mellitus with unspecified complications: Secondary | ICD-10-CM | POA: Diagnosis not present

## 2020-04-16 DIAGNOSIS — E892 Postprocedural hypoparathyroidism: Secondary | ICD-10-CM

## 2020-04-16 DIAGNOSIS — G8929 Other chronic pain: Secondary | ICD-10-CM

## 2020-04-16 DIAGNOSIS — M25561 Pain in right knee: Secondary | ICD-10-CM | POA: Diagnosis not present

## 2020-04-16 DIAGNOSIS — I10 Essential (primary) hypertension: Secondary | ICD-10-CM | POA: Diagnosis not present

## 2020-04-16 LAB — POCT GLYCOSYLATED HEMOGLOBIN (HGB A1C): Hemoglobin A1C: 6.9 % — AB (ref 4.0–5.6)

## 2020-04-16 NOTE — Progress Notes (Signed)
Date:  04/16/2020   Name:  Brittany Forbes   DOB:  09-24-1960   MRN:  [id number]   Chief Complaint: Hypertension and Diabetes  Diabetes She presents for her follow-up diabetic visit. She has type 2 diabetes mellitus. Her disease course has been stable. Pertinent negatives for hypoglycemia include no headaches or tremors. Pertinent negatives for diabetes include no chest pain, no fatigue, no polydipsia and no polyuria. Symptoms are stable. Current diabetic treatment includes oral agent (monotherapy). She is compliant with treatment all of the time. An ACE inhibitor/angiotensin II receptor blocker is being taken. Eye exam is current.  Hypertension This is a chronic problem. The problem is controlled. Pertinent negatives include no chest pain, headaches, palpitations or shortness of breath. Past treatments include angiotensin blockers. The current treatment provides significant improvement.  Knee Pain  There was no injury mechanism. The pain is present in the right knee. The quality of the pain is described as aching. The pain is mild. The pain has been fluctuating since onset. Pertinent negatives include no numbness. She reports no foreign bodies present. The symptoms are aggravated by weight bearing. She has tried NSAIDs for the symptoms. The treatment provided moderate relief.    Lab Results  Component Value Date   CREATININE 0.55 (L) 09/05/2019   BUN 12 09/05/2019   NA 138 09/05/2019   K 4.3 09/05/2019   CL 100 09/05/2019   CO2 22 09/05/2019   Lab Results  Component Value Date   CHOL 161 09/05/2019   HDL 56 09/05/2019   LDLCALC 82 09/05/2019   TRIG 133 09/05/2019   CHOLHDL 2.9 09/05/2019   Lab Results  Component Value Date   TSH 1.040 09/05/2019   Lab Results  Component Value Date   HGBA1C 6.9 (A) 04/16/2020   Lab Results  Component Value Date   WBC 9.0 09/05/2019   HGB 12.1 09/05/2019   HCT 37.2 09/05/2019   MCV 81 09/05/2019   PLT 328 09/05/2019   Lab Results   Component Value Date   ALT 18 09/05/2019   AST 14 09/05/2019   ALKPHOS 122 (H) 09/05/2019   BILITOT 0.3 09/05/2019     Review of Systems  Constitutional: Negative for appetite change, fatigue, fever and unexpected weight change.  HENT: Negative for tinnitus and trouble swallowing.   Eyes: Negative for visual disturbance.  Respiratory: Negative for cough, chest tightness and shortness of breath.   Cardiovascular: Negative for chest pain, palpitations and leg swelling.  Gastrointestinal: Negative for abdominal pain.  Endocrine: Negative for polydipsia and polyuria.  Genitourinary: Negative for dysuria and hematuria.  Musculoskeletal: Negative for arthralgias.  Neurological: Negative for tremors, numbness and headaches.  Psychiatric/Behavioral: Negative for dysphoric mood.    Patient Active Problem List   Diagnosis Date Noted  . 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 Nephrolithiasis 01/23/2017  . Microscopic hematuria 08/22/2016  . Type II diabetes mellitus with complication ([place]) 02/18/3233  . Essential (primary) hypertension 06/06/2014  . Hyperlipidemia associated with type 2 diabetes mellitus ([place]) 06/06/2014  . Persistent proteinuria associated with type 2 diabetes mellitus ([place]) 06/06/2014  . Abnormal liver enzymes 09/12/2011  . Status post parathyroidectomy 05/29/2011    Allergies  Allergen Reactions  . Ace Inhibitors     Cough  . Latex     Latex bandages cause a rash, paper tape is ok  . Tape     Paper tape ok    Past Surgical History:  Procedure Laterality Date  .  IR NEPHROSTOMY PLACEMENT LEFT  01/23/2017  . KIDNEY STONE SURGERY Left 03/13/2005  . NEPHROLITHOTOMY Left 01/23/2017   Procedure: NEPHROLITHOTOMY PERCUTANEOUS;  Surgeon: Abbie Sons, MD;  Location: [facility] ORS;  Service: Urology;  Laterality: Left;  . PARATHYROIDECTOMY  08/11/2011   [facility]  . TONSILLECTOMY      Social History   Tobacco Use  . Smoking status: Never  Smoker  . Smokeless tobacco: Never Used  Vaping Use  . Vaping Use: Never used  Substance Use Topics  . Alcohol use: No    Alcohol/week: 0.0 standard drinks  . Drug use: No     Medication list has been reviewed and updated.  Current Meds  Medication Sig  . atorvastatin (LIPITOR) 20 MG tablet TAKE 1 TABLET DAILY  . Ca Carbonate-Mag Hydroxide (ROLAIDS PO) Take 1 tablet by mouth daily as needed (acid reflux).  Marland Kitchen GLUCOSAMINE HCL-MSM PO Take 1-2 tablets by mouth 2 (two) times daily. Take 2 tablets (1000mg ) in the morning and 1 tablet (500mg ) in the evening  . glucose blood (ONE TOUCH ULTRA TEST) test strip Use to test blood sugar daily  . losartan (COZAAR) 50 MG tablet TAKE 1 TABLET DAILY  . metFORMIN (GLUCOPHAGE) 500 MG tablet TAKE 1 TABLET 3 TIMES A DAY(NEW DOSE INCREASE)  . MULTIPLE VITAMIN PO Take 1 tablet by mouth daily.  Glory Rosebush Delica Lancets 15A MISC Use 1 each 2 (two) times daily to test blood sugar. Extra fine  . potassium citrate (UROCIT-K) 10 MEQ (1080 MG) SR tablet TAKE 1 TABLET DAILY    PHQ 2/9 Scores 04/16/2020 12/22/2019 09/05/2019 02/09/2019  PHQ - 2 Score 0 2 0 0  PHQ- 9 Score 0 2 0 -    GAD 7 : Generalized Anxiety Score 04/16/2020 12/22/2019 09/05/2019  Nervous, Anxious, on Edge 0 2 0  Control/stop worrying 0 2 0  Worry too much - different things 0 2 0  Trouble relaxing 0 0 0  Restless 0 0 0  Easily annoyed or irritable 0 2 0  Afraid - awful might happen 0 0 0  Total GAD 7 Score 0 8 0  Anxiety Difficulty Not difficult at all Not difficult at all Not difficult at all    BP Readings from Last 3 Encounters:  04/16/20 134/70  12/22/19 130/72  09/05/19 (!) 130/76    Physical Exam Vitals and nursing note reviewed.  Constitutional:      General: She is not in acute distress.    Appearance: She is well-developed.  HENT:     Head: Normocephalic and atraumatic.  Cardiovascular:     Rate and Rhythm: Normal rate and regular rhythm.     Pulses: Normal pulses.      Heart sounds: No murmur heard.   Pulmonary:     Effort: Pulmonary effort is normal. No respiratory distress.  Musculoskeletal:     Cervical back: Normal range of motion.     Right knee: Crepitus present. No swelling or effusion. Normal range of motion. Tenderness (distal patella) present.     Left knee: No swelling, effusion or crepitus. Normal range of motion. No tenderness.     Right lower leg: No edema.     Left lower leg: No edema.  Skin:    General: Skin is warm and dry.     Findings: No rash.  Neurological:     Mental Status: She is alert and oriented to person, place, and time.  Psychiatric:        Attention and  Perception: Attention normal.        Mood and Affect: Mood normal.        Behavior: Behavior normal.     Wt Readings from Last 3 Encounters:  04/16/20 218 lb (98.9 kg)  12/22/19 215 lb (97.5 kg)  09/05/19 (!) 219 lb (99.3 kg)    BP 134/70   Pulse 96   Ht 5' (1.524 m)   Wt 218 lb (98.9 kg)   SpO2 97%   BMI 42.58 kg/m   Assessment and Plan: 1. Type II diabetes mellitus with complication (HCC) Clinically stable by exam and report without s/s of hypoglycemia. DM complicated by HTN. Tolerating medications - metformin -  well without side effects or other concerns. - POCT glycosylated hemoglobin (Hb A1C)  2. Essential (primary) hypertension Clinically stable exam with well controlled BP. Tolerating medications without side effects at this time. Pt to continue current regimen and low sodium diet; benefits of regular exercise as able discussed.  3. History of parathyroidectomy ([place])  4. Chronic pain of right knee Continue Aleve bid prn Activity as tolerated Suggest SM referral    Partially dictated using Editor, commissioning. Any errors are unintentional.  Halina Maidens, MD [facility]  04/16/2020

## 2020-07-07 ENCOUNTER — Other Ambulatory Visit: Payer: Self-pay | Admitting: Internal Medicine

## 2020-07-10 NOTE — Telephone Encounter (Signed)
Requested Prescriptions  Pending Prescriptions Disp Refills  . atorvastatin (LIPITOR) 20 MG tablet [Pharmacy Med Name: ATORVASTATIN TAB 20MG ] 90 tablet 0    Sig: TAKE 1 TABLET DAILY     Cardiovascular:  Antilipid - Statins Passed - 07/07/2020  8:10 AM      Passed - Total Cholesterol in normal range and within 360 days    Cholesterol, Total  Date Value Ref Range Status  09/05/2019 161 100 - 199 mg/dL Final         Passed - LDL in normal range and within 360 days    LDL Chol Calc (NIH)  Date Value Ref Range Status  09/05/2019 82 0 - 99 mg/dL Final         Passed - HDL in normal range and within 360 days    HDL  Date Value Ref Range Status  09/05/2019 56 >39 mg/dL Final         Passed - Triglycerides in normal range and within 360 days    Triglycerides  Date Value Ref Range Status  09/05/2019 133 0 - 149 mg/dL Final         Passed - Patient is not pregnant      Passed - Valid encounter within last 12 months    Recent Outpatient Visits          2 months ago Type II diabetes mellitus with complication [facility])   [facility] Glean Hess, MD   6 months ago Type II diabetes mellitus with complication [facility])   [facility] Glean Hess, MD   10 months ago Annual physical exam   [facility] Glean Hess, MD   1 year ago Type 2 diabetes mellitus with microalbuminuria, without long-term current use of insulin [facility])   [facility] Glean Hess, MD   1 year ago Annual physical exam   [facility] Glean Hess, MD      Future Appointments            In 1 month Army Melia Jesse Sans, MD [facility], [facility]

## 2020-09-06 ENCOUNTER — Ambulatory Visit (INDEPENDENT_AMBULATORY_CARE_PROVIDER_SITE_OTHER): Payer: BC Managed Care – PPO | Admitting: Internal Medicine

## 2020-09-06 ENCOUNTER — Other Ambulatory Visit: Payer: Self-pay

## 2020-09-06 ENCOUNTER — Other Ambulatory Visit: Payer: Self-pay | Admitting: Internal Medicine

## 2020-09-06 ENCOUNTER — Encounter: Payer: Self-pay | Admitting: Internal Medicine

## 2020-09-06 VITALS — BP 124/76 | HR 95 | Ht 60.0 in | Wt 221.0 lb

## 2020-09-06 DIAGNOSIS — Z0000 Encounter for general adult medical examination without abnormal findings: Secondary | ICD-10-CM

## 2020-09-06 DIAGNOSIS — Z23 Encounter for immunization: Secondary | ICD-10-CM | POA: Diagnosis not present

## 2020-09-06 DIAGNOSIS — E1169 Type 2 diabetes mellitus with other specified complication: Secondary | ICD-10-CM

## 2020-09-06 DIAGNOSIS — I10 Essential (primary) hypertension: Secondary | ICD-10-CM | POA: Diagnosis not present

## 2020-09-06 DIAGNOSIS — E785 Hyperlipidemia, unspecified: Secondary | ICD-10-CM

## 2020-09-06 DIAGNOSIS — Z1211 Encounter for screening for malignant neoplasm of colon: Secondary | ICD-10-CM

## 2020-09-06 DIAGNOSIS — E118 Type 2 diabetes mellitus with unspecified complications: Secondary | ICD-10-CM | POA: Diagnosis not present

## 2020-09-06 DIAGNOSIS — N3001 Acute cystitis with hematuria: Secondary | ICD-10-CM

## 2020-09-06 DIAGNOSIS — Z1231 Encounter for screening mammogram for malignant neoplasm of breast: Secondary | ICD-10-CM

## 2020-09-06 LAB — POCT URINALYSIS DIPSTICK
Bilirubin, UA: NEGATIVE
Glucose, UA: NEGATIVE
Ketones, UA: NEGATIVE
Nitrite, UA: NEGATIVE
Protein, UA: POSITIVE — AB
Spec Grav, UA: >=1.03 — AB (ref 1.010–1.025)
Urobilinogen, UA: 0.2 E.U./dL
pH, UA: 6 (ref 5.0–8.0)

## 2020-09-06 NOTE — Addendum Note (Signed)
Addended by: Clista Bernhardt on: 09/06/2020 09:44 AM   Modules accepted: Orders

## 2020-09-06 NOTE — Progress Notes (Signed)
Date:  09/06/2020   Name:  Brittany Forbes   DOB:  Jan 12, 1961   MRN:  [id number]   Chief Complaint: Annual Exam (UA. Foot Exam. ) Brittany Forbes is a 60 y.o. female who presents today for her Complete Annual Exam. She feels well. She reports exercising - off and on, also doing PT for knees. She reports she is sleeping well. Breast complaints - none.  Mammogram: 10/2019 DEXA: none Pap smear: 08/2018 neg with co-testing Colonoscopy: 02/2011 @ [facility]  Immunization History  Administered Date(s) Administered   Influenza,inj,Quad PF,6+ Mos 12/20/2014, 10/09/2018   Influenza-Unspecified 11/22/2015, 11/29/2016, 11/16/2019   Moderna Sars-Covid-2 Vaccination 04/09/2019, 05/07/2019   Pneumococcal Polysaccharide-23 02/11/2011   Tdap 01/11/2011   Zoster Recombinat (Shingrix) 09/20/2019, 12/22/2019   Zoster, Live 05/18/2013    Diabetes She presents for her follow-up diabetic visit. She has type 2 diabetes mellitus. Her disease course has been stable. Pertinent negatives for hypoglycemia include no dizziness, headaches, nervousness/anxiousness or tremors. Pertinent negatives for diabetes include no chest pain, no fatigue, no polydipsia and no polyuria. An ACE inhibitor/angiotensin II receptor blocker is being taken.  Hypertension This is a chronic problem. The problem is controlled. Pertinent negatives include no chest pain, headaches, palpitations or shortness of breath.  Hyperlipidemia This is a chronic problem. The problem is controlled. Pertinent negatives include no chest pain or shortness of breath. Current antihyperlipidemic treatment includes statins.   Lab Results  Component Value Date   CREATININE 0.55 (L) 09/05/2019   BUN 12 09/05/2019   NA 138 09/05/2019   K 4.3 09/05/2019   CL 100 09/05/2019   CO2 22 09/05/2019   Lab Results  Component Value Date   CHOL 161 09/05/2019   HDL 56 09/05/2019   LDLCALC 82 09/05/2019   TRIG 133 09/05/2019   CHOLHDL 2.9 09/05/2019   Lab  Results  Component Value Date   TSH 1.040 09/05/2019   Lab Results  Component Value Date   HGBA1C 6.9 (A) 04/16/2020   Lab Results  Component Value Date   WBC 9.0 09/05/2019   HGB 12.1 09/05/2019   HCT 37.2 09/05/2019   MCV 81 09/05/2019   PLT 328 09/05/2019   Lab Results  Component Value Date   ALT 18 09/05/2019   AST 14 09/05/2019   ALKPHOS 122 (H) 09/05/2019   BILITOT 0.3 09/05/2019     Review of Systems  Constitutional:  Negative for chills, fatigue and fever.  HENT:  Negative for congestion, hearing loss, tinnitus, trouble swallowing and voice change.   Eyes:  Negative for visual disturbance.  Respiratory:  Negative for cough, chest tightness, shortness of breath and wheezing.   Cardiovascular:  Negative for chest pain, palpitations and leg swelling.  Gastrointestinal:  Negative for abdominal pain, constipation, diarrhea and vomiting.  Endocrine: Negative for polydipsia and polyuria.  Genitourinary:  Negative for dysuria, frequency, genital sores, vaginal bleeding and vaginal discharge.  Musculoskeletal:  Positive for arthralgias (both knees). Negative for gait problem and joint swelling.  Skin:  Negative for color change and rash.  Neurological:  Negative for dizziness, tremors, light-headedness and headaches.  Hematological:  Negative for adenopathy. Does not bruise/bleed easily.  Psychiatric/Behavioral:  Negative for dysphoric mood and sleep disturbance. The patient is not nervous/anxious.    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   Nephrolithiasis 01/23/2017   Microscopic hematuria 08/22/2016   Type II diabetes mellitus with complication ([place]) 123456  Essential (primary) hypertension 06/06/2014   Hyperlipidemia associated with type 2 diabetes mellitus ([place]) 06/06/2014   Persistent proteinuria associated with type 2 diabetes mellitus ([place]) 06/06/2014   Abnormal liver enzymes  09/12/2011   Status post parathyroidectomy 05/29/2011    Allergies  Allergen Reactions   Ace Inhibitors     Cough   Latex     Latex bandages cause a rash, paper tape is ok   Tape     Paper tape ok    Past Surgical History:  Procedure Laterality Date   IR NEPHROSTOMY PLACEMENT LEFT  01/23/2017   KIDNEY STONE SURGERY Left 03/13/2005   NEPHROLITHOTOMY Left 01/23/2017   Procedure: NEPHROLITHOTOMY PERCUTANEOUS;  Surgeon: Abbie Sons, MD;  Location: [facility] ORS;  Service: Urology;  Laterality: Left;   PARATHYROIDECTOMY  08/11/2011   [facility]   TONSILLECTOMY      Social History   Tobacco Use   Smoking status: Never   Smokeless tobacco: Never  Vaping Use   Vaping Use: Never used  Substance Use Topics   Alcohol use: No    Alcohol/week: 0.0 standard drinks   Drug use: No     Medication list has been reviewed and updated.  Current Meds  Medication Sig   atorvastatin (LIPITOR) 20 MG tablet TAKE 1 TABLET DAILY   Ca Carbonate-Mag Hydroxide (ROLAIDS PO) Take 1 tablet by mouth daily as needed (acid reflux).   Cholecalciferol 50 MCG (2000 UT) CAPS Vitamin D3 50 mcg (2,000 unit) capsule   GLUCOSAMINE HCL-MSM PO Take 1-2 tablets by mouth 2 (two) times daily. Take 2 tablets ('1000mg'$ ) in the morning and 1 tablet ('500mg'$ ) in the evening   glucose blood (ONE TOUCH ULTRA TEST) test strip Use to test blood sugar daily   losartan (COZAAR) 50 MG tablet TAKE 1 TABLET DAILY   meloxicam (MOBIC) 7.5 MG tablet SMARTSIG:1 Tablet(s) By Mouth Every 12 Hours   metFORMIN (GLUCOPHAGE) 500 MG tablet TAKE 1 TABLET 3 TIMES A DAY(NEW DOSE INCREASE)   MULTIPLE VITAMIN PO Take 1 tablet by mouth daily.   OneTouch Delica Lancets 99991111 MISC Use 1 each 2 (two) times daily to test blood sugar. Extra fine   potassium citrate (UROCIT-K) 10 MEQ (1080 MG) SR tablet TAKE 1 TABLET DAILY (Patient taking differently: Take 10 mEq by mouth. Three times a week.)    PHQ 2/9 Scores 09/06/2020 04/16/2020 12/22/2019 09/05/2019  PHQ  - 2 Score 0 0 2 0  PHQ- 9 Score 2 0 2 0    GAD 7 : Generalized Anxiety Score 09/06/2020 04/16/2020 12/22/2019 09/05/2019  Nervous, Anxious, on Edge 0 0 2 0  Control/stop worrying 0 0 2 0  Worry too much - different things 0 0 2 0  Trouble relaxing 0 0 0 0  Restless 0 0 0 0  Easily annoyed or irritable 1 0 2 0  Afraid - awful might happen 0 0 0 0  Total GAD 7 Score 1 0 8 0  Anxiety Difficulty Not difficult at all Not difficult at all Not difficult at all Not difficult at all    BP Readings from Last 3 Encounters:  09/06/20 124/76  04/16/20 134/70  12/22/19 130/72    Physical Exam Vitals and nursing note reviewed.  Constitutional:      General: She is not in acute distress.    Appearance: She is well-developed.  HENT:     Head: Normocephalic and atraumatic.     Right Ear: Tympanic membrane and ear canal normal.  Left Ear: Tympanic membrane and ear canal normal.     Nose:     Right Sinus: No maxillary sinus tenderness.     Left Sinus: No maxillary sinus tenderness.  Eyes:     General: No scleral icterus.       Right eye: No discharge.        Left eye: No discharge.     Conjunctiva/sclera: Conjunctivae normal.  Neck:     Thyroid: No thyromegaly.     Vascular: No carotid bruit.  Cardiovascular:     Rate and Rhythm: Normal rate and regular rhythm.     Pulses: Normal pulses.     Heart sounds: Normal heart sounds.  Pulmonary:     Effort: Pulmonary effort is normal. No respiratory distress.     Breath sounds: No wheezing.  Chest:  Breasts:    Right: No mass, nipple discharge, skin change or tenderness.     Left: No mass, nipple discharge, skin change or tenderness.  Abdominal:     General: Bowel sounds are normal.     Palpations: Abdomen is soft.     Tenderness: There is no abdominal tenderness.  Musculoskeletal:     Cervical back: Normal range of motion. No erythema.     Right lower leg: No edema.     Left lower leg: No edema.  Lymphadenopathy:     Cervical: No  cervical adenopathy.  Skin:    General: Skin is warm and dry.     Capillary Refill: Capillary refill takes less than 2 seconds.     Findings: No rash.  Neurological:     General: No focal deficit present.     Mental Status: She is alert and oriented to person, place, and time.     Cranial Nerves: No cranial nerve deficit.     Sensory: No sensory deficit.     Deep Tendon Reflexes: Reflexes are normal and symmetric.  Psychiatric:        Attention and Perception: Attention normal.        Mood and Affect: Mood normal.    Wt Readings from Last 3 Encounters:  09/06/20 221 lb (100.2 kg)  04/16/20 218 lb (98.9 kg)  12/22/19 215 lb (97.5 kg)    BP 124/76 (BP Location: Right Arm, Patient Position: Sitting, Cuff Size: Large)   Pulse 95   Ht 5' (1.524 m)   Wt 221 lb (100.2 kg)   SpO2 96%   BMI 43.16 kg/m   Assessment and Plan: 1. Annual physical exam Exam is normal except for weight. Encourage regular exercise and appropriate dietary changes. Hx of parathyroid adenoma - having similar sx as before with word finding  2. Encounter for screening mammogram for breast cancer Schedule at [facility] - MM 3D SCREEN BREAST BILATERAL; Future  3. Colon cancer screening - Ambulatory referral to Gastroenterology  4. Type II diabetes mellitus with complication (HCC) Clinically stable by exam and report without s/s of hypoglycemia. DM complicated by hypertension and dyslipidemia. Tolerating medications well without side effects or other concerns. - Comprehensive metabolic panel - Hemoglobin A1c - TSH  5. Essential (primary) hypertension Clinically stable exam with well controlled BP. Tolerating medications without side effects at this time. Pt to continue current regimen and low sodium diet; benefits of regular exercise as able discussed. - CBC with Differential/Platelet - Comprehensive metabolic panel - POCT urinalysis dipstick - WBC, trace bacteria; will get UCx  6. Hyperlipidemia  associated with type 2 diabetes mellitus ([place]) On statin therapy -  Lipid panel  7. Need for vaccination for pneumococcus Given today.   Partially dictated using Editor, commissioning. Any errors are unintentional.  Halina Maidens, MD [facility]  09/06/2020

## 2020-09-07 LAB — CBC WITH DIFFERENTIAL/PLATELET
Basophils Absolute: 0.1 10*3/uL (ref 0.0–0.2)
Basos: 1 %
EOS (ABSOLUTE): 0.2 10*3/uL (ref 0.0–0.4)
Eos: 2 %
Hematocrit: 40.6 % (ref 34.0–46.6)
Hemoglobin: 13.2 g/dL (ref 11.1–15.9)
Immature Grans (Abs): 0 10*3/uL (ref 0.0–0.1)
Immature Granulocytes: 0 %
Lymphocytes Absolute: 3.2 10*3/uL — ABNORMAL HIGH (ref 0.7–3.1)
Lymphs: 36 %
MCH: 27.3 pg (ref 26.6–33.0)
MCHC: 32.5 g/dL (ref 31.5–35.7)
MCV: 84 fL (ref 79–97)
Monocytes Absolute: 0.6 10*3/uL (ref 0.1–0.9)
Monocytes: 6 %
Neutrophils Absolute: 4.9 10*3/uL (ref 1.4–7.0)
Neutrophils: 55 %
Platelets: 291 10*3/uL (ref 150–450)
RBC: 4.84 x10E6/uL (ref 3.77–5.28)
RDW: 14.1 % (ref 11.7–15.4)
WBC: 8.9 10*3/uL (ref 3.4–10.8)

## 2020-09-07 LAB — COMPREHENSIVE METABOLIC PANEL
ALT: 19 IU/L (ref 0–32)
AST: 18 IU/L (ref 0–40)
Albumin/Globulin Ratio: 2.3 — ABNORMAL HIGH (ref 1.2–2.2)
Albumin: 4.6 g/dL (ref 3.8–4.8)
Alkaline Phosphatase: 113 IU/L (ref 44–121)
BUN/Creatinine Ratio: 23 (ref 12–28)
BUN: 14 mg/dL (ref 8–27)
Bilirubin Total: 0.4 mg/dL (ref 0.0–1.2)
CO2: 23 mmol/L (ref 20–29)
Calcium: 9.8 mg/dL (ref 8.7–10.3)
Chloride: 100 mmol/L (ref 96–106)
Creatinine, Ser: 0.62 mg/dL (ref 0.57–1.00)
Globulin, Total: 2 g/dL (ref 1.5–4.5)
Glucose: 132 mg/dL — ABNORMAL HIGH (ref 65–99)
Potassium: 4.6 mmol/L (ref 3.5–5.2)
Sodium: 139 mmol/L (ref 134–144)
Total Protein: 6.6 g/dL (ref 6.0–8.5)
eGFR: 101 mL/min/{1.73_m2} (ref >59)

## 2020-09-07 LAB — LIPID PANEL
Chol/HDL Ratio: 2.6 ratio (ref 0.0–4.4)
Cholesterol, Total: 149 mg/dL (ref 100–199)
HDL: 58 mg/dL (ref >39)
LDL Chol Calc (NIH): 66 mg/dL (ref 0–99)
Triglycerides: 145 mg/dL (ref 0–149)
VLDL Cholesterol Cal: 25 mg/dL (ref 5–40)

## 2020-09-07 LAB — TSH: TSH: 0.772 u[IU]/mL (ref 0.450–4.500)

## 2020-09-07 LAB — HEMOGLOBIN A1C
Est. average glucose Bld gHb Est-mCnc: 171 mg/dL
Hgb A1c MFr Bld: 7.6 % — ABNORMAL HIGH (ref 4.8–5.6)

## 2020-09-07 NOTE — Progress Notes (Signed)
Informed pt with labs. Pt verbalized understanding.  KP

## 2020-09-08 LAB — URINE CULTURE

## 2020-09-13 ENCOUNTER — Encounter: Payer: Self-pay | Admitting: *Deleted

## 2020-09-19 ENCOUNTER — Telehealth: Payer: BC Managed Care – PPO

## 2020-10-04 ENCOUNTER — Other Ambulatory Visit: Payer: Self-pay | Admitting: Internal Medicine

## 2020-10-04 NOTE — Telephone Encounter (Signed)
Requested Prescriptions  Pending Prescriptions Disp Refills  . atorvastatin (LIPITOR) 20 MG tablet [Pharmacy Med Name: ATORVASTATIN TAB '20MG'$ ] 90 tablet 0    Sig: TAKE 1 TABLET DAILY     Cardiovascular:  Antilipid - Statins Passed - 10/04/2020  2:17 AM      Passed - Total Cholesterol in normal range and within 360 days    Cholesterol, Total  Date Value Ref Range Status  09/06/2020 149 100 - 199 mg/dL Final         Passed - LDL in normal range and within 360 days    LDL Chol Calc (NIH)  Date Value Ref Range Status  09/06/2020 66 0 - 99 mg/dL Final         Passed - HDL in normal range and within 360 days    HDL  Date Value Ref Range Status  09/06/2020 58 >39 mg/dL Final         Passed - Triglycerides in normal range and within 360 days    Triglycerides  Date Value Ref Range Status  09/06/2020 145 0 - 149 mg/dL Final         Passed - Patient is not pregnant      Passed - Valid encounter within last 12 months    Recent Outpatient Visits          4 weeks ago Annual physical exam   [facility] Glean Hess, MD   5 months ago Type II diabetes mellitus with complication [facility])   [facility] Glean Hess, MD   9 months ago Type II diabetes mellitus with complication [facility])   [facility] Glean Hess, MD   1 year ago Annual physical exam   [facility] Glean Hess, MD   1 year ago Type 2 diabetes mellitus with microalbuminuria, without long-term current use of insulin [facility])   [facility] Glean Hess, MD      Future Appointments            In 3 months Army Melia Jesse Sans, MD [facility] [place]   In 11 months Army Melia Jesse Sans, MD [facility], [facility]

## 2020-10-09 ENCOUNTER — Other Ambulatory Visit: Payer: Self-pay | Admitting: Internal Medicine

## 2020-10-10 NOTE — Telephone Encounter (Signed)
Requested Prescriptions  Pending Prescriptions Disp Refills  . potassium citrate (UROCIT-K) 10 MEQ (1080 MG) SR tablet [Pharmacy Med Name: POT CITRA ER TAB 10MEQ] 90 tablet 0    Sig: TAKE 1 TABLET DAILY     Endocrinology:  Minerals - Potassium Citrate Passed - 10/09/2020  5:51 PM      Passed - CO2 in normal range and within 120 days    CO2  Date Value Ref Range Status  09/06/2020 23 20 - 29 mmol/L Final         Passed - Cl in normal range and within 120 days    Chloride  Date Value Ref Range Status  09/06/2020 100 96 - 106 mmol/L Final         Passed - Cr in normal range and within 120 days    Creatinine, Ser  Date Value Ref Range Status  09/06/2020 0.62 0.57 - 1.00 mg/dL Final         Passed - K in normal range and within 120 days    Potassium  Date Value Ref Range Status  09/06/2020 4.6 3.5 - 5.2 mmol/L Final         Passed - Na in normal range and within 120 days    Sodium  Date Value Ref Range Status  09/06/2020 139 134 - 144 mmol/L Final         Passed - WBC in normal range and within 120 days    WBC  Date Value Ref Range Status  09/06/2020 8.9 3.4 - 10.8 x10E3/uL Final  12/31/2017 12.0 (H) 4.0 - 10.5 K/uL Final         Passed - RBC in normal range and within 120 days    RBC  Date Value Ref Range Status  09/06/2020 4.84 3.77 - 5.28 x10E6/uL Final  12/31/2017 4.47 3.87 - 5.11 MIL/uL Final         Passed - HGB in normal range and within 120 days    Hemoglobin  Date Value Ref Range Status  09/06/2020 13.2 11.1 - 15.9 g/dL Final         Passed - HCT in normal range and within 120 days    Hematocrit  Date Value Ref Range Status  09/06/2020 40.6 34.0 - 46.6 % Final         Passed - PLT in normal range and within 120 days    Platelets  Date Value Ref Range Status  09/06/2020 291 150 - 450 x10E3/uL Final         Passed - Valid encounter within last 12 months    Recent Outpatient Visits          1 month ago Annual physical exam   [facility] Glean Hess, MD   5 months ago Type II diabetes mellitus with complication [facility])   [facility] Glean Hess, MD   9 months ago Type II diabetes mellitus with complication [facility])   [facility] Glean Hess, MD   1 year ago Annual physical exam   [facility] Glean Hess, MD   1 year ago Type 2 diabetes mellitus with microalbuminuria, without long-term current use of insulin [facility])   [facility] Glean Hess, MD      Future Appointments            In 2 months Army Melia Jesse Sans, MD [facility], [place]   In 11 months Glean Hess, MD [facility]  [place]

## 2020-10-24 ENCOUNTER — Other Ambulatory Visit: Payer: Self-pay

## 2020-10-24 NOTE — Progress Notes (Signed)
Unable to contact for cc screening. Letter will be mailed out.

## 2020-10-25 ENCOUNTER — Ambulatory Visit
Admission: RE | Admit: 2020-10-25 | Discharge: 2020-10-25 | Disposition: A | Discharge status: Home / Self Care | Payer: BC Managed Care – PPO | Source: Ambulatory Visit | Attending: Internal Medicine | Admitting: Internal Medicine

## 2020-10-25 ENCOUNTER — Other Ambulatory Visit: Payer: Self-pay

## 2020-10-25 DIAGNOSIS — Z1231 Encounter for screening mammogram for malignant neoplasm of breast: Secondary | ICD-10-CM | POA: Insufficient documentation

## 2020-11-01 ENCOUNTER — Telehealth: Payer: Self-pay

## 2020-11-01 NOTE — Telephone Encounter (Signed)
Pt. Calling to schedule colonoscopy 

## 2020-11-02 NOTE — Telephone Encounter (Signed)
Returned patients call. LVM to call office back. 

## 2020-11-26 ENCOUNTER — Telehealth: Payer: Self-pay

## 2020-11-26 NOTE — Telephone Encounter (Signed)
Pt. Calling to schedule colonoscopy. 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

## 2020-11-27 ENCOUNTER — Other Ambulatory Visit: Payer: Self-pay

## 2020-11-27 DIAGNOSIS — Z1211 Encounter for screening for malignant neoplasm of colon: Secondary | ICD-10-CM

## 2020-11-27 MED ORDER — NA SULFATE-K SULFATE-MG SULF 17.5-3.13-1.6 GM/177ML PO SOLN: 1.0000 | Freq: Once | ORAL | 0 refills | Status: AC

## 2020-11-27 NOTE — Progress Notes (Signed)
Gastroenterology Pre-Procedure Review  Request Date: 02/14/2021 Requesting Physician: Dr. Marius Ditch  PATIENT REVIEW QUESTIONS: The patient responded to the following health history questions as indicated:    1. Are you having any GI issues? no 2. Do you have a personal history of Polyps? no 3. Do you have a family history of Colon Cancer or Polyps? yes (M. Grandfather colon cancer) 4. Diabetes Mellitus? yes (Type II) 5. Joint replacements in the past 12 months?no 6. Major health problems in the past 3 months?no 7. Any artificial heart valves, MVP, or defibrillator?no    MEDICATIONS & ALLERGIES:    Patient reports the following regarding taking any anticoagulation/antiplatelet therapy:   Plavix, Coumadin, Eliquis, Xarelto, Lovenox, Pradaxa, Brilinta, or Effient? no Aspirin? no  Patient confirms/reports the following medications:  Current Outpatient Medications  Medication Sig Dispense Refill   atorvastatin (LIPITOR) 20 MG tablet TAKE 1 TABLET DAILY 90 tablet 0   Ca Carbonate-Mag Hydroxide (ROLAIDS PO) Take 1 tablet by mouth daily as needed (acid reflux).     Cholecalciferol 50 MCG (2000 UT) CAPS Vitamin D3 50 mcg (2,000 unit) capsule     GLUCOSAMINE HCL-MSM PO Take 1-2 tablets by mouth 2 (two) times daily. Take 2 tablets (1000mg ) in the morning and 1 tablet (500mg ) in the evening     glucose blood (ONE TOUCH ULTRA TEST) test strip Use to test blood sugar daily 100 each 3   losartan (COZAAR) 50 MG tablet TAKE 1 TABLET DAILY 90 tablet 3   meloxicam (MOBIC) 7.5 MG tablet SMARTSIG:1 Tablet(s) By Mouth Every 12 Hours     metFORMIN (GLUCOPHAGE) 500 MG tablet TAKE 1 TABLET 3 TIMES A DAY(NEW DOSE INCREASE) 270 tablet 3   MULTIPLE VITAMIN PO Take 1 tablet by mouth daily.     OneTouch Delica Lancets 66M MISC Use 1 each 2 (two) times daily to test blood sugar. Extra fine 100 each 12   potassium citrate (UROCIT-K) 10 MEQ (1080 MG) SR tablet TAKE 1 TABLET DAILY 90 tablet 0   No current  facility-administered medications for this visit.    Patient confirms/reports the following allergies:  Allergies  Allergen Reactions   Ace Inhibitors     Cough   Latex     Latex bandages cause a rash, paper tape is ok   Tape     Paper tape ok    No orders of the defined types were placed in this encounter.   AUTHORIZATION INFORMATION Primary Insurance: 1D#: Group #:  Secondary Insurance: 1D#: Group #:  SCHEDULE INFORMATION: Date: 02/14/2021 Time: Location: [place]

## 2020-11-27 NOTE — Telephone Encounter (Signed)
Procedure has been scheduled for 02/14/2021.

## 2020-12-27 LAB — HM DIABETES EYE EXAM

## 2021-01-02 ENCOUNTER — Other Ambulatory Visit: Payer: Self-pay | Admitting: Internal Medicine

## 2021-01-02 NOTE — Telephone Encounter (Signed)
Requested Prescriptions  Pending Prescriptions Disp Refills  . atorvastatin (LIPITOR) 20 MG tablet [Pharmacy Med Name: ATORVASTATIN TAB 20MG ] 90 tablet 0    Sig: TAKE 1 TABLET DAILY     Cardiovascular:  Antilipid - Statins Passed - 01/02/2021  8:14 AM      Passed - Total Cholesterol in normal range and within 360 days    Cholesterol, Total  Date Value Ref Range Status  09/06/2020 149 100 - 199 mg/dL Final         Passed - LDL in normal range and within 360 days    LDL Chol Calc (NIH)  Date Value Ref Range Status  09/06/2020 66 0 - 99 mg/dL Final         Passed - HDL in normal range and within 360 days    HDL  Date Value Ref Range Status  09/06/2020 58 >39 mg/dL Final         Passed - Triglycerides in normal range and within 360 days    Triglycerides  Date Value Ref Range Status  09/06/2020 145 0 - 149 mg/dL Final         Passed - Patient is not pregnant      Passed - Valid encounter within last 12 months    Recent Outpatient Visits          3 months ago Annual physical exam   [facility] Glean Hess, MD   8 months ago Type II diabetes mellitus with complication [facility])   [facility] Glean Hess, MD   1 year ago Type II diabetes mellitus with complication [facility])   [facility] Glean Hess, MD   1 year ago Annual physical exam   [facility] Glean Hess, MD   1 year ago Type 2 diabetes mellitus with microalbuminuria, without long-term current use of insulin [facility])   [facility] Glean Hess, MD      Future Appointments            In 5 days Glean Hess, MD [facility], [place]   In 8 months Army Melia, Jesse Sans, MD [facility], [facility]

## 2021-01-07 ENCOUNTER — Ambulatory Visit: Payer: BC Managed Care – PPO | Admitting: Internal Medicine

## 2021-01-07 ENCOUNTER — Encounter: Payer: Self-pay | Admitting: Internal Medicine

## 2021-01-07 ENCOUNTER — Other Ambulatory Visit: Payer: Self-pay

## 2021-01-07 VITALS — BP 128/70 | HR 80 | Ht 60.0 in | Wt 218.0 lb

## 2021-01-07 DIAGNOSIS — E118 Type 2 diabetes mellitus with unspecified complications: Secondary | ICD-10-CM

## 2021-01-07 DIAGNOSIS — I10 Essential (primary) hypertension: Secondary | ICD-10-CM | POA: Diagnosis not present

## 2021-01-07 LAB — POCT GLYCOSYLATED HEMOGLOBIN (HGB A1C): Hemoglobin A1C: 7.3 % — AB (ref 4.0–5.6)

## 2021-01-07 NOTE — Progress Notes (Signed)
Date:  01/07/2021   Name:  Brittany Forbes   DOB:  1960/05/27   MRN:  [id number]   Chief Complaint: Diabetes  Diabetes She presents for her follow-up diabetic visit. She has type 2 diabetes mellitus. Her disease course has been improving. Pertinent negatives for hypoglycemia include no headaches or tremors. Pertinent negatives for diabetes include no chest pain, no fatigue, no polydipsia and no polyuria. Current diabetic treatment includes oral agent (monotherapy) (metformin). She is compliant with treatment all of the time (no change in medication last visit but re-enforced diet). Her weight is decreasing steadily. She participates in exercise three times a week. Her home blood glucose trend is decreasing steadily. An ACE inhibitor/angiotensin II receptor blocker is being taken. Eye exam is current.   Lab Results  Component Value Date   NA 139 09/06/2020   K 4.6 09/06/2020   CO2 23 09/06/2020   GLUCOSE 132 (H) 09/06/2020   BUN 14 09/06/2020   CREATININE 0.62 09/06/2020   CALCIUM 9.8 09/06/2020   EGFR 101 09/06/2020   GFRNONAA 102 09/05/2019   Lab Results  Component Value Date   CHOL 149 09/06/2020   HDL 58 09/06/2020   LDLCALC 66 09/06/2020   TRIG 145 09/06/2020   CHOLHDL 2.6 09/06/2020   Lab Results  Component Value Date   TSH 0.772 09/06/2020   Lab Results  Component Value Date   HGBA1C 7.6 (H) 09/06/2020   Lab Results  Component Value Date   WBC 8.9 09/06/2020   HGB 13.2 09/06/2020   HCT 40.6 09/06/2020   MCV 84 09/06/2020   PLT 291 09/06/2020   Lab Results  Component Value Date   ALT 19 09/06/2020   AST 18 09/06/2020   ALKPHOS 113 09/06/2020   BILITOT 0.4 09/06/2020   No components found for: VITD  Review of Systems  Constitutional:  Negative for appetite change, fatigue, fever and unexpected weight change.  HENT:  Negative for tinnitus and trouble swallowing.   Eyes:  Negative for visual disturbance.  Respiratory:  Negative for cough, chest  tightness and shortness of breath.   Cardiovascular:  Negative for chest pain, palpitations and leg swelling.  Gastrointestinal:  Negative for abdominal pain.  Endocrine: Negative for polydipsia and polyuria.  Genitourinary:  Negative for dysuria and hematuria.  Musculoskeletal:  Negative for arthralgias.  Neurological:  Negative for tremors, numbness and headaches.  Psychiatric/Behavioral:  Negative for dysphoric mood.    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   Nephrolithiasis 01/23/2017   Microscopic hematuria 08/22/2016   Type II diabetes mellitus with complication ([place]) 54/56/2563   Essential (primary) hypertension 06/06/2014   Hyperlipidemia associated with type 2 diabetes mellitus ([place]) 06/06/2014   Persistent proteinuria associated with type 2 diabetes mellitus ([place]) 06/06/2014   Abnormal liver enzymes 09/12/2011   Status post parathyroidectomy 05/29/2011    Allergies  Allergen Reactions   Ace Inhibitors     Cough   Latex     Latex bandages cause a rash, paper tape is ok   Tape     Paper tape ok    Past Surgical History:  Procedure Laterality Date   IR NEPHROSTOMY PLACEMENT LEFT  01/23/2017   KIDNEY STONE SURGERY Left 03/13/2005   NEPHROLITHOTOMY Left 01/23/2017   Procedure: NEPHROLITHOTOMY PERCUTANEOUS;  Surgeon: Abbie Sons, MD;  Location: [facility] ORS;  Service: Urology;  Laterality: Left;   PARATHYROIDECTOMY  08/11/2011   [facility]  TONSILLECTOMY      Social History   Tobacco Use   Smoking status: Never   Smokeless tobacco: Never  Vaping Use   Vaping Use: Never used  Substance Use Topics   Alcohol use: No    Alcohol/week: 0.0 standard drinks   Drug use: No     Medication list has been reviewed and updated.  Current Meds  Medication Sig   atorvastatin (LIPITOR) 20 MG tablet TAKE 1 TABLET DAILY   Ca Carbonate-Mag Hydroxide (ROLAIDS PO) Take 1 tablet by mouth daily as  needed (acid reflux).   Cholecalciferol 50 MCG (2000 UT) CAPS Vitamin D3 50 mcg (2,000 unit) capsule   GLUCOSAMINE HCL-MSM PO Take 1-2 tablets by mouth 2 (two) times daily. Take 2 tablets (1043m) in the morning and 1 tablet (5043m in the evening   glucose blood (ONE TOUCH ULTRA TEST) test strip Use to test blood sugar daily   losartan (COZAAR) 50 MG tablet TAKE 1 TABLET DAILY   meloxicam (MOBIC) 7.5 MG tablet Take 7.5 mg by mouth as needed.   metFORMIN (GLUCOPHAGE) 500 MG tablet TAKE 1 TABLET 3 TIMES A DAY(NEW DOSE INCREASE)   MULTIPLE VITAMIN PO Take 1 tablet by mouth daily.   OneTouch Delica Lancets 3336UISC Use 1 each 2 (two) times daily to test blood sugar. Extra fine   potassium citrate (UROCIT-K) 10 MEQ (1080 MG) SR tablet TAKE 1 TABLET DAILY    PHQ 2/9 Scores 09/06/2020 04/16/2020 12/22/2019 09/05/2019  PHQ - 2 Score 0 0 2 0  PHQ- 9 Score 2 0 2 0    GAD 7 : Generalized Anxiety Score 09/06/2020 04/16/2020 12/22/2019 09/05/2019  Nervous, Anxious, on Edge 0 0 2 0  Control/stop worrying 0 0 2 0  Worry too much - different things 0 0 2 0  Trouble relaxing 0 0 0 0  Restless 0 0 0 0  Easily annoyed or irritable 1 0 2 0  Afraid - awful might happen 0 0 0 0  Total GAD 7 Score 1 0 8 0  Anxiety Difficulty Not difficult at all Not difficult at all Not difficult at all Not difficult at all    BP Readings from Last 3 Encounters:  01/07/21 128/70  09/06/20 124/76  04/16/20 134/70    Physical Exam Vitals and nursing note reviewed.  Constitutional:      General: She is not in acute distress.    Appearance: She is well-developed.  HENT:     Head: Normocephalic and atraumatic.  Cardiovascular:     Rate and Rhythm: Normal rate and regular rhythm.     Pulses: Normal pulses.     Heart sounds: No murmur heard. Pulmonary:     Effort: Pulmonary effort is normal. No respiratory distress.     Breath sounds: No wheezing or rhonchi.  Musculoskeletal:     Right lower leg: No edema.     Left  lower leg: No edema.  Skin:    General: Skin is warm and dry.     Findings: No rash.  Neurological:     General: No focal deficit present.     Mental Status: She is alert and oriented to person, place, and time.  Psychiatric:        Mood and Affect: Mood normal.        Behavior: Behavior normal.    Wt Readings from Last 3 Encounters:  01/07/21 218 lb (98.9 kg)  09/06/20 221 lb (100.2 kg)  04/16/20 218 lb (98.9 kg)  BP 128/70   Pulse 80   Ht 5' (1.524 m)   Wt 218 lb (98.9 kg)   SpO2 96%   BMI 42.58 kg/m   Assessment and Plan: 1. Type II diabetes mellitus with complication (HCC) Clinically stable by exam and report without s/s of hypoglycemia. DM complicated by hypertension and dyslipidemia.  BS decreasing steadily with diet changes and increased physical activity. Tolerating medications well without side effects or other concerns. - POCT glycosylated hemoglobin (Hb A1C)= 7.3 down from 7.6  2. Essential (primary) hypertension Clinically stable exam with well controlled BP. Tolerating medications without side effects at this time. Pt to continue current regimen and low sodium diet; benefits of regular exercise as able discussed.   Partially dictated using Editor, commissioning. Any errors are unintentional.  Halina Maidens, MD [facility]  01/07/2021

## 2021-01-08 ENCOUNTER — Encounter: Payer: Self-pay | Admitting: Internal Medicine

## 2021-01-22 ENCOUNTER — Other Ambulatory Visit: Payer: Self-pay | Admitting: Internal Medicine

## 2021-01-22 DIAGNOSIS — E1129 Type 2 diabetes mellitus with other diabetic kidney complication: Secondary | ICD-10-CM

## 2021-01-22 DIAGNOSIS — R809 Proteinuria, unspecified: Secondary | ICD-10-CM

## 2021-01-22 NOTE — Telephone Encounter (Signed)
Requested Prescriptions  Pending Prescriptions Disp Refills   metFORMIN (GLUCOPHAGE) 500 MG tablet [Pharmacy Med Name: METFORMIN TAB 500MG] 270 tablet 0    Sig: TAKE 1 TABLET 3 TIMES A DAY(NEW DOSE INCREASE)     Endocrinology:  Diabetes - Biguanides Passed - 01/22/2021  8:09 AM      Passed - Cr in normal range and within 360 days    Creatinine, Ser  Date Value Ref Range Status  09/06/2020 0.62 0.57 - 1.00 mg/dL Final         Passed - HBA1C is between 0 and 7.9 and within 180 days    Hemoglobin A1C  Date Value Ref Range Status  01/07/2021 7.3 (A) 4.0 - 5.6 % Final   Hgb A1c MFr Bld  Date Value Ref Range Status  09/06/2020 7.6 (H) 4.8 - 5.6 % Final    Comment:             Prediabetes: 5.7 - 6.4          Diabetes: >6.4          Glycemic control for adults with diabetes: <7.0          Passed - eGFR in normal range and within 360 days    GFR calc Af Amer  Date Value Ref Range Status  09/05/2019 117 >59 mL/min/1.73 Final    Comment:    **[facility] currently reports eGFR in compliance with the current**   recommendations of the [employer]. [facility] will   update reporting as new guidelines are published from the NKF-ASN   Task force.    GFR calc non Af Amer  Date Value Ref Range Status  09/05/2019 102 >59 mL/min/1.73 Final   eGFR  Date Value Ref Range Status  09/06/2020 101 >59 mL/min/1.73 Final         Passed - Valid encounter within last 6 months    Recent Outpatient Visits          2 weeks ago Type II diabetes mellitus with complication [facility])   [facility] Glean Hess, MD   4 months ago Annual physical exam   [facility] Glean Hess, MD   9 months ago Type II diabetes mellitus with complication [facility])   [facility] Glean Hess, MD   1 year ago Type II diabetes mellitus with complication [facility])   [facility] Glean Hess, MD   1 year ago Annual physical exam   [facility]  Glean Hess, MD      Future Appointments            In 3 months Army Melia Jesse Sans, MD [facility], [place]   In 8 months Glean Hess, MD [facility], [facility]           due for follow up in March

## 2021-02-14 ENCOUNTER — Ambulatory Visit: Payer: BC Managed Care – PPO | Admitting: Certified Registered Nurse Anesthetist

## 2021-02-14 ENCOUNTER — Ambulatory Visit
Admission: RE | Admit: 2021-02-14 | Discharge: 2021-02-14 | Disposition: A | Discharge status: Home / Self Care | Payer: BC Managed Care – PPO | Attending: Gastroenterology | Admitting: Gastroenterology

## 2021-02-14 ENCOUNTER — Encounter
Admission: RE | Disposition: A | Discharge status: Home / Self Care | Payer: Self-pay | Source: Home / Self Care | Attending: Gastroenterology

## 2021-02-14 ENCOUNTER — Encounter: Payer: Self-pay | Admitting: Gastroenterology

## 2021-02-14 DIAGNOSIS — K635 Polyp of colon: Secondary | ICD-10-CM | POA: Diagnosis not present

## 2021-02-14 DIAGNOSIS — I129 Hypertensive chronic kidney disease with stage 1 through stage 4 chronic kidney disease, or unspecified chronic kidney disease: Secondary | ICD-10-CM | POA: Diagnosis not present

## 2021-02-14 DIAGNOSIS — D649 Anemia, unspecified: Secondary | ICD-10-CM | POA: Insufficient documentation

## 2021-02-14 DIAGNOSIS — R319 Hematuria, unspecified: Secondary | ICD-10-CM | POA: Insufficient documentation

## 2021-02-14 DIAGNOSIS — N189 Chronic kidney disease, unspecified: Secondary | ICD-10-CM | POA: Diagnosis not present

## 2021-02-14 DIAGNOSIS — Z1211 Encounter for screening for malignant neoplasm of colon: Secondary | ICD-10-CM

## 2021-02-14 DIAGNOSIS — K219 Gastro-esophageal reflux disease without esophagitis: Secondary | ICD-10-CM | POA: Insufficient documentation

## 2021-02-14 DIAGNOSIS — Z87442 Personal history of urinary calculi: Secondary | ICD-10-CM | POA: Insufficient documentation

## 2021-02-14 DIAGNOSIS — K5730 Diverticulosis of large intestine without perforation or abscess without bleeding: Secondary | ICD-10-CM | POA: Insufficient documentation

## 2021-02-14 DIAGNOSIS — E785 Hyperlipidemia, unspecified: Secondary | ICD-10-CM | POA: Insufficient documentation

## 2021-02-14 DIAGNOSIS — E1122 Type 2 diabetes mellitus with diabetic chronic kidney disease: Secondary | ICD-10-CM | POA: Insufficient documentation

## 2021-02-14 HISTORY — PX: COLONOSCOPY WITH PROPOFOL: SHX5780

## 2021-02-14 LAB — GLUCOSE, CAPILLARY: Glucose-Capillary: 151 mg/dL — ABNORMAL HIGH (ref 70–99)

## 2021-02-14 SURGERY — COLONOSCOPY WITH PROPOFOL
Anesthesia: General

## 2021-02-14 MED ORDER — SODIUM CHLORIDE 0.9 % IV SOLN: INTRAVENOUS | Status: DC

## 2021-02-14 MED ORDER — LIDOCAINE HCL (CARDIAC) PF 100 MG/5ML IV SOSY
PREFILLED_SYRINGE | INTRAVENOUS | Status: DC | PRN
Start: 2021-02-14 — End: 2021-02-14
  Administered 2021-02-14: 50 mg via INTRAVENOUS

## 2021-02-14 MED ORDER — PROPOFOL 500 MG/50ML IV EMUL
INTRAVENOUS | Status: DC | PRN
  Administered 2021-02-14: 150 ug/kg/min via INTRAVENOUS

## 2021-02-14 MED ORDER — PROPOFOL 500 MG/50ML IV EMUL
INTRAVENOUS | Status: AC
  Filled 2021-02-14: qty 50

## 2021-02-14 MED ORDER — LIDOCAINE HCL (PF) 2 % IJ SOLN
INTRAMUSCULAR | Status: AC
  Filled 2021-02-14: qty 5

## 2021-02-14 MED ORDER — PROPOFOL 10 MG/ML IV BOLUS
INTRAVENOUS | Status: DC | PRN
Start: 2021-02-14 — End: 2021-02-14
  Administered 2021-02-14: 20 mg via INTRAVENOUS
  Administered 2021-02-14: 60 mg via INTRAVENOUS
  Administered 2021-02-14: 20 mg via INTRAVENOUS

## 2021-02-14 NOTE — Anesthesia Preprocedure Evaluation (Signed)
Anesthesia Evaluation  Patient identified by MRN, date of birth, ID band Patient awake    Reviewed: Allergy & Precautions, NPO status , Patient's Chart, lab work & pertinent test results  History of Anesthesia Complications (+) Family history of anesthesia reaction and history of anesthetic complications  Airway Mallampati: III  TM Distance: <3 FB Neck ROM: full    Dental  (+) Chipped   Pulmonary neg pulmonary ROS, neg shortness of breath,    Pulmonary exam normal        Cardiovascular Exercise Tolerance: Good hypertension, Normal cardiovascular exam     Neuro/Psych negative neurological ROS  negative psych ROS   GI/Hepatic Neg liver ROS, GERD  Medicated and Controlled,  Endo/Other  diabetes, Type 2  Renal/GU Renal disease  negative genitourinary   Musculoskeletal   Abdominal   Peds  Hematology negative hematology ROS (+)   Anesthesia Other Findings Past Medical History: No date: Anemia     Comment:  H/O No date: Diabetes ([place]) No date: Family history of adverse reaction to anesthesia     Comment:  SISTER-PT UNSURE OF REACTION No date: GERD (gastroesophageal reflux disease) No date: Hematuria No date: History of kidney stones No date: HLD (hyperlipidemia) No date: HTN (hypertension)  Past Surgical History: 01/23/2017: IR NEPHROSTOMY PLACEMENT LEFT 03/13/2005: KIDNEY STONE SURGERY; Left 01/23/2017: NEPHROLITHOTOMY; Left     Comment:  Procedure: NEPHROLITHOTOMY PERCUTANEOUS;  Surgeon:               Abbie Sons, MD;  Location: [facility] ORS;  Service:               Urology;  Laterality: Left; 08/11/2011: PARATHYROIDECTOMY     Comment:  [facility] No date: TONSILLECTOMY  BMI    Body Mass Index: 42.58 kg/m      Reproductive/Obstetrics negative OB ROS                             Anesthesia Physical Anesthesia Plan  ASA: 3  Anesthesia Plan: General   Post-op Pain Management:     Induction: Intravenous  PONV Risk Score and Plan: Propofol infusion and TIVA  Airway Management Planned: Natural Airway and Nasal Cannula  Additional Equipment:   Intra-op Plan:   Post-operative Plan:   Informed Consent: I have reviewed the patients History and Physical, chart, labs and discussed the procedure including the risks, benefits and alternatives for the proposed anesthesia with the patient or authorized representative who has indicated his/her understanding and acceptance.     Dental Advisory Given  Plan Discussed with: Anesthesiologist, CRNA and Surgeon  Anesthesia Plan Comments: (Patient consented for risks of anesthesia including but not limited to:  - adverse reactions to medications - risk of airway placement if required - damage to eyes, teeth, lips or other oral mucosa - nerve damage due to positioning  - sore throat or hoarseness - Damage to heart, brain, nerves, lungs, other parts of body or loss of life  Patient voiced understanding.)        Anesthesia Quick Evaluation

## 2021-02-14 NOTE — Op Note (Signed)
[facility] Gastroenterology Patient Name: Brittany Forbes Procedure Date: 02/14/2021 7:09 AM MRN: [id number] Account #: [id number] Date of Birth: 28-Aug-1960 Admit Type: Outpatient Age: 61 Room: [facility] ENDO ROOM 4 Gender: Female Note Status: Finalized Instrument Name: [name] [id number] Procedure:             Colonoscopy Indications:           Screening for colorectal malignant neoplasm, Last                         colonoscopy 10 years ago Providers:             Lin Landsman MD, MD Referring MD:          Halina Maidens, MD (Referring MD) Medicines:             General Anesthesia Complications:         No immediate complications. Estimated blood loss:                         Minimal. Procedure:             Pre-Anesthesia Assessment:                        - Prior to the procedure, a History and Physical was                         performed, and patient medications and allergies were                         reviewed. The patient is competent. The risks and                         benefits of the procedure and the sedation options and                         risks were discussed with the patient. All questions                         were answered and informed consent was obtained.                         Patient identification and proposed procedure were                         verified by the physician, the nurse, the                         anesthesiologist, the anesthetist and the technician                         in the pre-procedure area in the procedure room in the                         endoscopy suite. Mental Status Examination: alert and                         oriented. Airway Examination: normal oropharyngeal  airway and neck mobility. Respiratory Examination:                         clear to auscultation. CV Examination: normal.                         Prophylactic Antibiotics: The patient does not require                          prophylactic antibiotics. Prior Anticoagulants: The                         patient has taken no previous anticoagulant or                         antiplatelet agents. ASA Grade Assessment: III - A                         patient with severe systemic disease. After reviewing                         the risks and benefits, the patient was deemed in                         satisfactory condition to undergo the procedure. The                         anesthesia plan was to use general anesthesia.                         Immediately prior to administration of medications,                         the patient was re-assessed for adequacy to receive                         sedatives. The heart rate, respiratory rate, oxygen                         saturations, blood pressure, adequacy of pulmonary                         ventilation, and response to care were monitored                         throughout the procedure. The physical status of the                         patient was re-assessed after the procedure.                        After obtaining informed consent, the colonoscope was                         passed under direct vision. Throughout the procedure,                         the patient's blood pressure, pulse, and oxygen  saturations were monitored continuously. The                         Colonoscope was introduced through the anus and                         advanced to the the cecum, identified by appendiceal                         orifice and ileocecal valve. The colonoscopy was                         performed without difficulty. The patient tolerated                         the procedure well. The quality of the bowel                         preparation was evaluated using the BBPS [facility] Bowel                         Preparation Scale) with scores of: Right Colon = 3,                         Transverse Colon = 3 and Left Colon = 3 (entire mucosa                          seen well with no residual staining, small fragments                         of stool or opaque liquid). The total BBPS score                         equals 9. Findings:      The perianal and digital rectal examinations were normal. Pertinent       negatives include normal sphincter tone and no palpable rectal lesions.      A 6 mm polyp was found in the sigmoid colon. The polyp was sessile. The       polyp was removed with a cold snare. Resection and retrieval were       complete.      Multiple diverticula were found in the recto-sigmoid colon and sigmoid       colon.      The retroflexed view of the distal rectum and anal verge was normal and       showed no anal or rectal abnormalities. Impression:            - One 6 mm polyp in the sigmoid colon, removed with a                         cold snare. Resected and retrieved.                        - Diverticulosis in the recto-sigmoid colon and in the                         sigmoid colon.                        -  The distal rectum and anal verge are normal on                         retroflexion view. Recommendation:        - Discharge patient to home (with escort).                        - Resume previous diet today.                        - Continue present medications.                        - Await pathology results.                        - Repeat colonoscopy in 7-10 years for surveillance                         based on pathology results. Procedure Code(s):     --- Professional ---                        [phone], Colonoscopy, flexible; with removal of                         tumor(s), polyp(s), or other lesion(s) by snare                         technique Diagnosis Code(s):     --- Professional ---                        Z12.11, Encounter for screening for malignant neoplasm                         of colon                        K63.5, Polyp of colon                        K57.30, Diverticulosis of large  intestine without                         perforation or abscess without bleeding CPT copyright 2019 American Medical Association. All rights reserved. The codes documented in this report are preliminary and upon coder review may  be revised to meet current compliance requirements. Dr. Ulyess Mort Lin Landsman MD, MD 02/14/2021 8:02:48 AM This report has been signed electronically. Number of Addenda: 0 Note Initiated On: 02/14/2021 7:09 AM Scope Withdrawal Time: 0 hours 14 minutes 58 seconds  Total Procedure Duration: 0 hours 18 minutes 3 seconds       [facility]

## 2021-02-14 NOTE — Anesthesia Postprocedure Evaluation (Signed)
Anesthesia Post Note  [name]  Procedure(s) Performed: COLONOSCOPY WITH PROPOFOL  Patient location during evaluation: Endoscopy Anesthesia Type: General Level of consciousness: awake and alert Pain management: pain level controlled Vital Signs Assessment: post-procedure vital signs reviewed and stable Respiratory status: spontaneous breathing, nonlabored ventilation, respiratory function stable and patient connected to nasal cannula oxygen Cardiovascular status: blood pressure returned to baseline and stable Postop Assessment: no apparent nausea or vomiting Anesthetic complications: no   No notable events documented.   Last Vitals:  Vitals:   02/14/21 0812 02/14/21 0822  BP: 105/69 (!) 103/56  Pulse:    Resp:    Temp:    SpO2:      Last Pain:  Vitals:   02/14/21 0832  TempSrc:   PainSc: 0-No pain                 Precious Haws Edilberto Roosevelt

## 2021-02-14 NOTE — H&P (Signed)
Brittany Darby, MD [address]  [place]  [place], [place] [address]  Main: [phone]  Fax: [phone] Pager: [phone]  Primary Care Physician:  Glean Hess, MD Primary Gastroenterologist:  Dr. Cephas Forbes  Pre-Procedure History & Physical: HPI:  Brittany Forbes is a 61 y.o. female is here for an colonoscopy.   Past Medical History:  Diagnosis Date   Anemia    H/O   Diabetes ([place])    Family history of adverse reaction to anesthesia    SISTER-PT UNSURE OF REACTION   GERD (gastroesophageal reflux disease)    Hematuria    History of kidney stones    HLD (hyperlipidemia)    HTN (hypertension)     Past Surgical History:  Procedure Laterality Date   IR NEPHROSTOMY PLACEMENT LEFT  01/23/2017   KIDNEY STONE SURGERY Left 03/13/2005   NEPHROLITHOTOMY Left 01/23/2017   Procedure: NEPHROLITHOTOMY PERCUTANEOUS;  Surgeon: Abbie Sons, MD;  Location: [facility] ORS;  Service: Urology;  Laterality: Left;   PARATHYROIDECTOMY  08/11/2011   [facility]   TONSILLECTOMY      Prior to Admission medications   Medication Sig Start Date End Date Taking? Authorizing Provider  atorvastatin (LIPITOR) 20 MG tablet TAKE 1 TABLET DAILY 01/02/21  Yes Glean Hess, MD  losartan (COZAAR) 50 MG tablet TAKE 1 TABLET DAILY 03/12/20  Yes Glean Hess, MD  metFORMIN (GLUCOPHAGE) 500 MG tablet TAKE 1 TABLET 3 TIMES A DAY(NEW DOSE INCREASE) 01/22/21  Yes Glean Hess, MD  Ca Carbonate-Mag Hydroxide (ROLAIDS PO) Take 1 tablet by mouth daily as needed (acid reflux).    [provider]  Cholecalciferol 50 MCG (2000 UT) CAPS Vitamin D3 50 mcg (2,000 unit) capsule    [provider]  GLUCOSAMINE HCL-MSM PO Take 1-2 tablets by mouth 2 (two) times daily. Take 2 tablets (1000mg ) in the morning and 1 tablet (500mg ) in the evening    [provider]  glucose blood (ONE TOUCH ULTRA TEST) test strip Use to test blood sugar daily 02/29/16   Glean Hess, MD   meloxicam (MOBIC) 7.5 MG tablet Take 7.5 mg by mouth as needed. 07/23/20   [provider]  MULTIPLE VITAMIN PO Take 1 tablet by mouth daily.    [provider]  OneTouch Delica Lancets 53U MISC Use 1 each 2 (two) times daily to test blood sugar. Extra fine 12/14/18   Glean Hess, MD  potassium citrate (UROCIT-K) 10 MEQ (1080 MG) SR tablet TAKE 1 TABLET DAILY 10/10/20   Glean Hess, MD    Allergies as of 11/28/2020 - Review Complete 09/06/2020  Allergen Reaction Noted   Ace inhibitors  06/06/2014   Latex  12/11/2016   Tape  01/23/2017    Family History  Problem Relation Age of Onset   Cancer Mother        lung   Diabetes Father    Heart disease Father    Breast cancer Maternal Aunt 75   Kidney cancer Neg Hx    Bladder Cancer Neg Hx     Social History   Socioeconomic History   Marital status: Single    Spouse name: Not on file   Number of children: Not on file   Years of education: Not on file   Highest education level: Not on file  Occupational History   Occupation: Teacher    Comment: ESL  Tobacco Use   Smoking status: Never   Smokeless tobacco: Never  Vaping Use  Vaping Use: Never used  Substance and Sexual Activity   Alcohol use: No    Alcohol/week: 0.0 standard drinks   Drug use: No   Sexual activity: Not on file  Other Topics Concern   Not on file  Social History Narrative   Not on file   Social Determinants of Health   Financial Resource Strain: Not on file  Food Insecurity: Not on file  Transportation Needs: Not on file  Physical Activity: Not on file  Stress: Not on file  Social Connections: Not on file  Intimate Partner Violence: Not on file    Review of Systems: See HPI, otherwise negative ROS  Physical Exam: BP 136/76    Pulse 99    Temp (!) 97 F (36.1 C) (Temporal)    Resp 18    Ht 5' (1.524 m)    Wt 98.9 kg    SpO2 96%    BMI 42.58 kg/m  General:   Alert,  pleasant and cooperative in NAD Head:   Normocephalic and atraumatic. Neck:  Supple; no masses or thyromegaly. Lungs:  Clear throughout to auscultation.    Heart:  Regular rate and rhythm. Abdomen:  Soft, nontender and nondistended. Normal bowel sounds, without guarding, and without rebound.   Neurologic:  Alert and  oriented x4;  grossly normal neurologically.  Impression/Plan: Brittany Forbes is here for an colonoscopy to be performed for colon cancer screening  Risks, benefits, limitations, and alternatives regarding  colonoscopy have been reviewed with the patient.  Questions have been answered.  All parties agreeable.   Sherri Sear, MD  02/14/2021, 7:30 AM

## 2021-02-14 NOTE — Transfer of Care (Signed)
Immediate Anesthesia Transfer of Care Note  [name]  Procedure(s) Performed: COLONOSCOPY WITH PROPOFOL  Patient Location: Endoscopy Unit  Anesthesia Type:General  Level of Consciousness: awake, alert  and oriented  Airway & Oxygen Therapy: Patient Spontanous Breathing  Post-op Assessment: Report given to RN and Post -op Vital signs reviewed and stable  Post vital signs: Reviewed and stable  Last Vitals:  Vitals Value Taken Time  BP 89/67 02/14/21 0804  Temp 36 C 02/14/21 0802  Pulse 92 02/14/21 0803  Resp 24 02/14/21 0803  SpO2 99 % 02/14/21 0803  Vitals shown include unvalidated device data.  Last Pain:  Vitals:   02/14/21 0802  TempSrc: Temporal  PainSc: Asleep         Complications: No notable events documented.

## 2021-02-15 ENCOUNTER — Encounter: Payer: Self-pay | Admitting: Gastroenterology

## 2021-02-15 LAB — SURGICAL PATHOLOGY

## 2021-02-18 ENCOUNTER — Encounter: Payer: Self-pay | Admitting: Gastroenterology

## 2021-02-28 ENCOUNTER — Other Ambulatory Visit: Payer: Self-pay | Admitting: Internal Medicine

## 2021-02-28 DIAGNOSIS — I10 Essential (primary) hypertension: Secondary | ICD-10-CM

## 2021-02-28 NOTE — Telephone Encounter (Signed)
Requested Prescriptions  Pending Prescriptions Disp Refills   losartan (COZAAR) 50 MG tablet [Pharmacy Med Name: LOSARTAN TAB 50MG ] 90 tablet 1    Sig: TAKE 1 TABLET DAILY     Cardiovascular:  Angiotensin Receptor Blockers Passed - 02/28/2021  8:13 AM      Passed - Cr in normal range and within 180 days    Creatinine, Ser  Date Value Ref Range Status  09/06/2020 0.62 0.57 - 1.00 mg/dL Final         Passed - K in normal range and within 180 days    Potassium  Date Value Ref Range Status  09/06/2020 4.6 3.5 - 5.2 mmol/L Final         Passed - Patient is not pregnant      Passed - Last BP in normal range    BP Readings from Last 1 Encounters:  02/14/21 (!) 103/56         Passed - Valid encounter within last 6 months    Recent Outpatient Visits          1 month ago Type II diabetes mellitus with complication [facility])   [facility] Glean Hess, MD   5 months ago Annual physical exam   [facility] Glean Hess, MD   10 months ago Type II diabetes mellitus with complication [facility])   [facility] Glean Hess, MD   1 year ago Type II diabetes mellitus with complication [facility])   [facility] Glean Hess, MD   1 year ago Annual physical exam   [facility] Glean Hess, MD      Future Appointments            In 2 months Army Melia Jesse Sans, MD [facility], [place]   In 6 months Army Melia, Jesse Sans, MD [facility], [facility]

## 2021-03-31 ENCOUNTER — Other Ambulatory Visit: Payer: Self-pay | Admitting: Internal Medicine

## 2021-04-01 NOTE — Telephone Encounter (Signed)
Requested Prescriptions  Pending Prescriptions Disp Refills   atorvastatin (LIPITOR) 20 MG tablet [Pharmacy Med Name: ATORVASTATIN TAB 20MG ] 90 tablet 0    Sig: TAKE 1 TABLET DAILY     Cardiovascular:  Antilipid - Statins Failed - 03/31/2021  8:09 AM      Failed - Lipid Panel in normal range within the last 12 months    Cholesterol, Total  Date Value Ref Range Status  09/06/2020 149 100 - 199 mg/dL Final   LDL Chol Calc (NIH)  Date Value Ref Range Status  09/06/2020 66 0 - 99 mg/dL Final   HDL  Date Value Ref Range Status  09/06/2020 58 >39 mg/dL Final   Triglycerides  Date Value Ref Range Status  09/06/2020 145 0 - 149 mg/dL Final         Passed - Patient is not pregnant      Passed - Valid encounter within last 12 months    Recent Outpatient Visits          2 months ago Type II diabetes mellitus with complication [facility])   [facility] Glean Hess, MD   6 months ago Annual physical exam   [facility] Glean Hess, MD   11 months ago Type II diabetes mellitus with complication [facility])   [facility] Glean Hess, MD   1 year ago Type II diabetes mellitus with complication [facility])   [facility] Glean Hess, MD   1 year ago Annual physical exam   [facility] Glean Hess, MD      Future Appointments            In 1 month Army Melia Jesse Sans, MD [facility], [place]   In 5 months Army Melia, Jesse Sans, MD [facility], [facility]

## 2021-04-22 ENCOUNTER — Other Ambulatory Visit: Payer: Self-pay | Admitting: Internal Medicine

## 2021-04-22 DIAGNOSIS — E1129 Type 2 diabetes mellitus with other diabetic kidney complication: Secondary | ICD-10-CM

## 2021-04-22 NOTE — Telephone Encounter (Signed)
Requested Prescriptions  ?Pending Prescriptions Disp Refills  ?? metFORMIN (GLUCOPHAGE) 500 MG tablet [Pharmacy Med Name: METFORMIN TAB 500MG] 270 tablet 0  ?  Sig: TAKE 1 TABLET 3 TIMES A DAY(NEW DOSE INCREASE)  ?  ? Endocrinology:  Diabetes - Biguanides Failed - 04/22/2021  8:06 AM  ?  ?  Failed - B12 Level in normal range and within 720 days  ?  No results found for: VITAMINB12   ?  ?  Passed - Cr in normal range and within 360 days  ?  Creatinine, Ser  ?Date Value Ref Range Status  ?09/06/2020 0.62 0.57 - 1.00 mg/dL Final  ?   ?  ?  Passed - HBA1C is between 0 and 7.9 and within 180 days  ?  Hemoglobin A1C  ?Date Value Ref Range Status  ?01/07/2021 7.3 (A) 4.0 - 5.6 % Final  ? ?Hgb A1c MFr Bld  ?Date Value Ref Range Status  ?09/06/2020 7.6 (H) 4.8 - 5.6 % Final  ?  Comment:  ?           Prediabetes: 5.7 - 6.4 ?         Diabetes: >6.4 ?         Glycemic control for adults with diabetes: <7.0 ?  ?   ?  ?  Passed - eGFR in normal range and within 360 days  ?  GFR calc Af Amer  ?Date Value Ref Range Status  ?09/05/2019 117 >59 mL/min/1.73 Final  ?  Comment:  ?  **[facility] currently reports eGFR in compliance with the current** ?  recommendations of the [employer]. [facility] will ?  update reporting as new guidelines are published from the NKF-ASN ?  Task force. ?  ? ?GFR calc non Af Amer  ?Date Value Ref Range Status  ?09/05/2019 102 >59 mL/min/1.73 Final  ? ?eGFR  ?Date Value Ref Range Status  ?09/06/2020 101 >59 mL/min/1.73 Final  ?   ?  ?  Passed - Valid encounter within last 6 months  ?  Recent Outpatient Visits   ?      ? 3 months ago Type II diabetes mellitus with complication ([place])  ? [facility] Glean Hess, MD  ? 7 months ago Annual physical exam  ? [facility] Glean Hess, MD  ? 1 year ago Type II diabetes mellitus with complication [facility])  ? [facility] Glean Hess, MD  ? 1 year ago Type II diabetes mellitus with complication [facility])  ? [facility] Glean Hess, MD  ? 1 year ago Annual physical exam  ? [facility] Glean Hess, MD  ?  ?  ?Future Appointments   ?        ? In 2 weeks Army Melia Jesse Sans, MD [facility], [place]  ? In 5 months Glean Hess, MD [facility], [facility]  ?  ? ?  ?  ?  Passed - CBC within normal limits and completed in the last 12 months  ?  WBC  ?Date Value Ref Range Status  ?09/06/2020 8.9 3.4 - 10.8 x10E3/uL Final  ?12/31/2017 12.0 (H) 4.0 - 10.5 K/uL Final  ? ?RBC  ?Date Value Ref Range Status  ?09/06/2020 4.84 3.77 - 5.28 x10E6/uL Final  ?12/31/2017 4.47 3.87 - 5.11 MIL/uL Final  ? ?Hemoglobin  ?Date Value Ref Range Status  ?09/06/2020 13.2 11.1 - 15.9 g/dL Final  ? ?Hematocrit  ?Date Value  Ref Range Status  ?09/06/2020 40.6 34.0 - 46.6 % Final  ? ?MCHC  ?Date Value Ref Range Status  ?09/06/2020 32.5 31.5 - 35.7 g/dL Final  ?12/31/2017 32.6 30.0 - 36.0 g/dL Final  ? ?MCH  ?Date Value Ref Range Status  ?09/06/2020 27.3 26.6 - 33.0 pg Final  ?12/31/2017 27.3 26.0 - 34.0 pg Final  ? ?MCV  ?Date Value Ref Range Status  ?09/06/2020 84 79 - 97 fL Final  ? ?No results found for: PLTCOUNTKUC, LABPLAT, [place] ?RDW  ?Date Value Ref Range Status  ?09/06/2020 14.1 11.7 - 15.4 % Final  ? ?  ?  ?  ? ? ?

## 2021-05-07 ENCOUNTER — Ambulatory Visit: Payer: BC Managed Care – PPO | Admitting: Internal Medicine

## 2021-05-07 ENCOUNTER — Encounter: Payer: Self-pay | Admitting: Internal Medicine

## 2021-05-07 ENCOUNTER — Other Ambulatory Visit: Payer: Self-pay

## 2021-05-07 VITALS — BP 126/60 | HR 85 | Ht 60.0 in | Wt 214.0 lb

## 2021-05-07 DIAGNOSIS — Z23 Encounter for immunization: Secondary | ICD-10-CM | POA: Diagnosis not present

## 2021-05-07 DIAGNOSIS — I10 Essential (primary) hypertension: Secondary | ICD-10-CM | POA: Diagnosis not present

## 2021-05-07 DIAGNOSIS — E118 Type 2 diabetes mellitus with unspecified complications: Secondary | ICD-10-CM

## 2021-05-07 DIAGNOSIS — N200 Calculus of kidney: Secondary | ICD-10-CM

## 2021-05-07 DIAGNOSIS — E892 Postprocedural hypoparathyroidism: Secondary | ICD-10-CM

## 2021-05-07 MED ORDER — GLUCOSE BLOOD VI STRP: ORAL_STRIP | 3 refills | Status: DC

## 2021-05-07 MED ORDER — POTASSIUM CITRATE ER 10 MEQ (1080 MG) PO TBCR: 10.0000 meq | EXTENDED_RELEASE_TABLET | Freq: Every day | ORAL | 3 refills | Status: DC

## 2021-05-07 NOTE — Patient Instructions (Signed)
Td (Tetanus, Diphtheria) Vaccine: What You Need to Know ?1. Why get vaccinated? ?Td vaccine can prevent tetanus and diphtheria. ?Tetanus enters the body through cuts or wounds. Diphtheria spreads from person to person. ?TETANUS (T) causes painful stiffening of the muscles. Tetanus can lead to serious health problems, including being unable to open the mouth, having trouble swallowing and breathing, or death. ?DIPHTHERIA (D) can lead to difficulty breathing, heart failure, paralysis, or death. ?2. Td vaccine ?Td is only for children 7 years and older, adolescents, and adults.  ?Td is usually given as a booster dose every 10 years, or after 5 years in the case of a severe or dirty wound or burn. ?Another vaccine, called "Tdap," may be used instead of Td. Tdap protects against pertussis, also known as "whooping cough," in addition to tetanus and diphtheria. ?Td may be given at the same time as other vaccines. ?3. Talk with your health care provider ?Tell your vaccination provider if the person getting the vaccine: ?Has had an allergic reaction after a previous dose of any vaccine that protects against tetanus or diphtheria, or has any severe, life-threatening allergies ?Has ever had Guillain-Barr? Syndrome (also called "GBS") ?Has had severe pain or swelling after a previous dose of any vaccine that protects against tetanus or diphtheria ?In some cases, your health care provider may decide to postpone Td vaccination until a future visit. ?People with minor illnesses, such as a cold, may be vaccinated. People who are moderately or severely ill should usually wait until they recover before getting Td vaccine.  ?Your health care provider can give you more information. ?4. Risks of a vaccine reaction ?Pain, redness, or swelling where the shot was given, mild fever, headache, feeling tired, and nausea, vomiting, diarrhea, or stomachache sometimes happen after Td vaccination. ?People sometimes faint after medical procedures,  including vaccination. Tell your provider if you feel dizzy or have vision changes or ringing in the ears.  ?As with any medicine, there is a very remote chance of a vaccine causing a severe allergic reaction, other serious injury, or death. ?5. What if there is a serious problem? ?An allergic reaction could occur after the vaccinated person leaves the clinic. If you see signs of a severe allergic reaction (hives, swelling of the face and throat, difficulty breathing, a fast heartbeat, dizziness, or weakness), call 9-1-1 and get the person to the nearest hospital.  ?For other signs that concern you, call your health care provider.  ?Adverse reactions should be reported to the Vaccine Adverse Event Reporting System (VAERS). Your health care provider will usually file this report, or you can do it yourself. Visit the VAERS website at www.vaers.SamedayNews.es or call 3070894048. VAERS is only for reporting reactions, and VAERS staff members do not give medical advice. ?6. The National Vaccine Injury Compensation Program ?The National Vaccine Injury Compensation Program (VICP) is a federal program that was created to compensate people who may have been injured by certain vaccines. Claims regarding alleged injury or death due to vaccination have a time limit for filing, which may be as short as two years. Visit the VICP website at GoldCloset.com.ee or call 857-855-6396 to learn about the program and about filing a claim. ?7. How can I learn more? ?Ask your health care provider. ?Call your local or state health department. ?Visit the website of the Food and Drug Administration (FDA) for vaccine package inserts and additional information at TraderRating.uy. ?Contact the Centers for Disease Control and Prevention (CDC): ?Call (828) 248-1023 (1-800-CDC-INFO) or ?Visit  CDC's website at http://hunter.com/. ?Vaccine Information Statement Td (Tetanus, Diphtheria) Vaccine  (09/16/2019) ?This information is not intended to replace advice given to you by your health care provider. Make sure you discuss any questions you have with your health care provider. ?Document Revised: 11/03/2019 Document Reviewed: 11/03/2019 ?Elsevier Patient Education ? [place]. ? ?

## 2021-05-07 NOTE — Progress Notes (Signed)
? ? ?Date:  05/07/2021  ? ?Name:  Brittany Forbes   DOB:  12-08-1960   MRN:  [id number] ? ? ?Chief Complaint: Diabetes and Hypertension ? ?Diabetes ?She presents for her follow-up diabetic visit. She has type 2 diabetes mellitus. Her disease course has been stable. Pertinent negatives for hypoglycemia include no headaches or tremors. Pertinent negatives for diabetes include no chest pain, no fatigue, no polydipsia and no polyuria. Pertinent negatives for diabetic complications include no CVA. Current diabetic treatment includes oral agent (monotherapy) (metformin). An ACE inhibitor/angiotensin II receptor blocker is being taken. Eye exam is current.  ?Hypertension ?This is a chronic problem. The problem is controlled. Pertinent negatives include no chest pain, headaches, palpitations or shortness of breath. Past treatments include angiotensin blockers. The current treatment provides significant improvement. There are no compliance problems.  There is no history of kidney disease, CAD/MI or CVA.  ? ?Lab Results  ?Component Value Date  ? NA 139 09/06/2020  ? K 4.6 09/06/2020  ? CO2 23 09/06/2020  ? GLUCOSE 132 (H) 09/06/2020  ? BUN 14 09/06/2020  ? CREATININE 0.62 09/06/2020  ? CALCIUM 9.8 09/06/2020  ? EGFR 101 09/06/2020  ? GFRNONAA 102 09/05/2019  ? ?Lab Results  ?Component Value Date  ? CHOL 149 09/06/2020  ? HDL 58 09/06/2020  ? [place] 66 09/06/2020  ? TRIG 145 09/06/2020  ? CHOLHDL 2.6 09/06/2020  ? ?Lab Results  ?Component Value Date  ? TSH 0.772 09/06/2020  ? ?Lab Results  ?Component Value Date  ? HGBA1C 7.3 (A) 01/07/2021  ? ?Lab Results  ?Component Value Date  ? WBC 8.9 09/06/2020  ? HGB 13.2 09/06/2020  ? HCT 40.6 09/06/2020  ? MCV 84 09/06/2020  ? PLT 291 09/06/2020  ? ?Lab Results  ?Component Value Date  ? ALT 19 09/06/2020  ? AST 18 09/06/2020  ? ALKPHOS 113 09/06/2020  ? BILITOT 0.4 09/06/2020  ? ?No results found for: 25OHVITD2, [place], VD25OH  ? ?Review of Systems  ?Constitutional:  Negative for  appetite change, fatigue, fever and unexpected weight change.  ?HENT:  Negative for tinnitus and trouble swallowing.   ?Eyes:  Negative for visual disturbance.  ?Respiratory:  Negative for cough, chest tightness and shortness of breath.   ?Cardiovascular:  Negative for chest pain, palpitations and leg swelling.  ?Gastrointestinal:  Negative for abdominal pain.  ?Endocrine: Negative for polydipsia and polyuria.  ?Genitourinary:  Negative for dysuria and hematuria.  ?Musculoskeletal:  Negative for arthralgias.  ?Neurological:  Negative for tremors, numbness and headaches.  ?Psychiatric/Behavioral:  Negative for dysphoric mood.   ? ?Patient Active Problem List  ? Diagnosis Date Noted  ? Polyp of sigmoid colon   ? 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 Nephrolithiasis 01/23/2017  ? Microscopic hematuria 08/22/2016  ? Type II diabetes mellitus with complication ([place]) 19/41/7408  ? Essential (primary) hypertension 06/06/2014  ? Hyperlipidemia associated with type 2 diabetes mellitus ([place]) 06/06/2014  ? Persistent proteinuria associated with type 2 diabetes mellitus ([place]) 06/06/2014  ? Abnormal liver enzymes 09/12/2011  ? Status post parathyroidectomy 05/29/2011  ? ? ?Allergies  ?Allergen Reactions  ? Ace Inhibitors   ?  Cough  ? Latex   ?  Latex bandages cause a rash, paper tape is ok  ? Tape   ?  Paper tape ok  ? ? ?Past Surgical History:  ?Procedure Laterality Date  ? COLONOSCOPY WITH PROPOFOL N/A 02/14/2021  ? Procedure: COLONOSCOPY WITH PROPOFOL;  Surgeon: Lin Landsman, MD;  Location: [facility];  Service: Gastroenterology;  Laterality: N/A;  ? IR NEPHROSTOMY PLACEMENT LEFT  01/23/2017  ? KIDNEY STONE SURGERY Left 03/13/2005  ? NEPHROLITHOTOMY Left 01/23/2017  ? Procedure: NEPHROLITHOTOMY PERCUTANEOUS;  Surgeon: Abbie Sons, MD;  Location: [facility] ORS;  Service: Urology;  Laterality: Left;  ? PARATHYROIDECTOMY  08/11/2011  ? [facility]  ? TONSILLECTOMY    ? ? ?Social History  ? ?Tobacco  Use  ? Smoking status: Never  ? Smokeless tobacco: Never  ?Vaping Use  ? Vaping Use: Never used  ?Substance Use Topics  ? Alcohol use: No  ?  Alcohol/week: 0.0 standard drinks  ? Drug use: No  ? ? ? ?Medication list has been reviewed and updated. ? ?Current Meds  ?Medication Sig  ? atorvastatin (LIPITOR) 20 MG tablet TAKE 1 TABLET DAILY  ? Ca Carbonate-Mag Hydroxide (ROLAIDS PO) Take 1 tablet by mouth daily as needed (acid reflux).  ? Cholecalciferol 50 MCG (2000 UT) CAPS Vitamin D3 50 mcg (2,000 unit) capsule  ? GLUCOSAMINE HCL-MSM PO Take 1-2 tablets by mouth 2 (two) times daily. Take 2 tablets (1016m) in the morning and 1 tablet (5062m in the evening  ? glucose blood (ONE TOUCH ULTRA TEST) test strip Use to test blood sugar daily  ? losartan (COZAAR) 50 MG tablet TAKE 1 TABLET DAILY  ? meloxicam (MOBIC) 7.5 MG tablet Take 7.5 mg by mouth as needed.  ? metFORMIN (GLUCOPHAGE) 500 MG tablet TAKE 1 TABLET 3 TIMES A DAY(NEW DOSE INCREASE)  ? MULTIPLE VITAMIN PO Take 1 tablet by mouth daily.  ? OneTouch Delica Lancets [id number] Use 1 each 2 (two) times daily to test blood sugar. Extra fine  ? potassium citrate (UROCIT-K) 10 MEQ (1080 MG) SR tablet TAKE 1 TABLET DAILY  ? ? ? ?  05/07/2021  ?  3:23 PM 09/06/2020  ?  8:55 AM 04/16/2020  ?  3:08 PM 12/22/2019  ? 10:51 AM  ?PHQ 2/9 Scores  ?PHQ - 2 Score 0 0 0 2  ?PHQ- 9 Score 1 2 0 2  ? ? ? ?  05/07/2021  ?  3:23 PM 09/06/2020  ?  8:55 AM 04/16/2020  ?  3:08 PM 12/22/2019  ? 10:51 AM  ?GAD 7 : Generalized Anxiety Score  ?Nervous, Anxious, on Edge 0 0 0 2  ?Control/stop worrying 0 0 0 2  ?Worry too much - different things 0 0 0 2  ?Trouble relaxing 0 0 0 0  ?Restless 0 0 0 0  ?Easily annoyed or irritable 0 1 0 2  ?Afraid - awful might happen 0 0 0 0  ?Total GAD 7 Score 0 1 0 8  ?Anxiety Difficulty Not difficult at all Not difficult at all Not difficult at all Not difficult at all  ? ? ?BP Readings from Last 3 Encounters:  ?05/07/21 126/60  ?02/14/21 (!) 103/56  ?01/07/21 128/70   ? ? ?Physical Exam ?Vitals and nursing note reviewed.  ?Constitutional:   ?   General: She is not in acute distress. ?   Appearance: She is well-developed.  ?HENT:  ?   Head: Normocephalic and atraumatic.  ?Neck:  ?   Vascular: No carotid bruit.  ?Cardiovascular:  ?   Rate and Rhythm: Normal rate and regular rhythm.  ?   Pulses: Normal pulses.  ?Pulmonary:  ?   Effort: Pulmonary effort is normal. No respiratory distress.  ?   Breath sounds: No wheezing or rhonchi.  ?Musculoskeletal:  ?   Cervical  back: Normal range of motion.  ?   Right lower leg: No edema.  ?   Left lower leg: No edema.  ?Lymphadenopathy:  ?   Cervical: No cervical adenopathy.  ?Skin: ?   General: Skin is warm and dry.  ?   Capillary Refill: Capillary refill takes less than 2 seconds.  ?   Findings: No rash.  ?Neurological:  ?   General: No focal deficit present.  ?   Mental Status: She is alert and oriented to person, place, and time.  ?Psychiatric:     ?   Mood and Affect: Mood normal.     ?   Behavior: Behavior normal.  ? ? ?Wt Readings from Last 3 Encounters:  ?05/07/21 214 lb (97.1 kg)  ?02/14/21 218 lb (98.9 kg)  ?01/07/21 218 lb (98.9 kg)  ? ? ?BP 126/60   Pulse 85   Ht 5' (1.524 m)   Wt 214 lb (97.1 kg)   SpO2 96%   BMI 41.79 kg/m?  ? ?Assessment and Plan: ?1. Type II diabetes mellitus with complication ([place]) ?Clinically stable by exam and report without s/s of hypoglycemia.  She is working on diet and has lost 4 lbs. ?DM complicated by hypertension and dyslipidemia. ?Tolerating medications well without side effects or other concerns. ?- Hemoglobin A1c ?- glucose blood (ONE TOUCH ULTRA TEST) test strip; Use to test blood sugar daily  Dispense: 100 each; Refill: 3 ?- Microalbumin / creatinine urine ratio ? ?2. Essential (primary) hypertension ?Clinically stable exam with well controlled BP. ?Tolerating medications without side effects at this time. ?Pt to continue current regimen and low sodium diet; benefits of regular exercise as  able discussed. ? ?3. Nephrolithiasis ?Continue Urocit-K for stone prevention ?- potassium citrate (UROCIT-K) 10 MEQ (1080 MG) SR tablet; Take 1 tablet (10 mEq total) by mouth daily.  Dispense: 90 tablet; Refill

## 2021-05-08 ENCOUNTER — Encounter: Payer: Self-pay | Admitting: Internal Medicine

## 2021-05-08 LAB — HEMOGLOBIN A1C
Est. average glucose Bld gHb Est-mCnc: 166 mg/dL
Hgb A1c MFr Bld: 7.4 % — ABNORMAL HIGH (ref 4.8–5.6)

## 2021-05-08 LAB — MICROALBUMIN / CREATININE URINE RATIO
Creatinine, Urine: 83.3 mg/dL
Microalb/Creat Ratio: 64 mg/g creat — ABNORMAL HIGH (ref 0–29)
Microalbumin, Urine: 53.2 ug/mL

## 2021-06-29 ENCOUNTER — Other Ambulatory Visit: Payer: Self-pay | Admitting: Internal Medicine

## 2021-07-01 NOTE — Telephone Encounter (Signed)
Requested Prescriptions  Pending Prescriptions Disp Refills  . atorvastatin (LIPITOR) 20 MG tablet [Pharmacy Med Name: ATORVASTATIN TAB '20MG'$ ] 90 tablet 0    Sig: TAKE 1 TABLET DAILY     Cardiovascular:  Antilipid - Statins Failed - 06/29/2021  2:09 AM      Failed - Lipid Panel in normal range within the last 12 months    Cholesterol, Total  Date Value Ref Range Status  09/06/2020 149 100 - 199 mg/dL Final   LDL Chol Calc (NIH)  Date Value Ref Range Status  09/06/2020 66 0 - 99 mg/dL Final   HDL  Date Value Ref Range Status  09/06/2020 58 >39 mg/dL Final   Triglycerides  Date Value Ref Range Status  09/06/2020 145 0 - 149 mg/dL Final         Passed - Patient is not pregnant      Passed - Valid encounter within last 12 months    Recent Outpatient Visits          1 month ago Type II diabetes mellitus with complication [facility])   [facility] Glean [facility], MD   5 months ago Type II diabetes mellitus with complication [facility])   [facility] Glean Hess, MD   9 months ago Annual physical exam   [facility] Glean Hess, MD   1 year ago Type II diabetes mellitus with complication [facility])   [facility] Glean Hess, MD   1 year ago Type II diabetes mellitus with complication [facility])   [facility] Glean Hess, MD      Future Appointments            In 2 months Army Melia Jesse Sans, MD [facility], [facility]

## 2021-07-22 ENCOUNTER — Ambulatory Visit: Payer: BC Managed Care – PPO | Admitting: Internal Medicine

## 2021-07-22 ENCOUNTER — Encounter: Payer: Self-pay | Admitting: Internal Medicine

## 2021-07-22 VITALS — BP 136/88 | HR 106 | Temp 98.3°F | Ht 60.0 in | Wt 217.0 lb

## 2021-07-22 DIAGNOSIS — Z20818 Contact with and (suspected) exposure to other bacterial communicable diseases: Secondary | ICD-10-CM | POA: Diagnosis not present

## 2021-07-22 DIAGNOSIS — J029 Acute pharyngitis, unspecified: Secondary | ICD-10-CM | POA: Diagnosis not present

## 2021-07-22 LAB — POCT RAPID STREP A (OFFICE): Rapid Strep A Screen: NEGATIVE

## 2021-07-22 MED ORDER — AZITHROMYCIN 250 MG PO TABS: ORAL_TABLET | ORAL | 0 refills | Status: AC

## 2021-07-22 NOTE — Progress Notes (Signed)
Date:  07/22/2021   Name:  Brittany Forbes   DOB:  10/20/1960   MRN:  [id number]   Chief Complaint: Sore Throat (Exposed to strep )  Sore Throat  This is a new problem. The current episode started yesterday. The problem has been unchanged. There has been no fever. The pain is moderate. Associated symptoms include congestion, headaches and trouble swallowing. Pertinent negatives include no coughing, diarrhea, ear pain or vomiting. She has had exposure to strep. She has tried acetaminophen for the symptoms. The treatment provided mild relief.    Lab Results  Component Value Date   NA 139 09/06/2020   K 4.6 09/06/2020   CO2 23 09/06/2020   GLUCOSE 132 (H) 09/06/2020   BUN 14 09/06/2020   CREATININE 0.62 09/06/2020   CALCIUM 9.8 09/06/2020   EGFR 101 09/06/2020   GFRNONAA 102 09/05/2019   Lab Results  Component Value Date   CHOL 149 09/06/2020   HDL 58 09/06/2020   LDLCALC 66 09/06/2020   TRIG 145 09/06/2020   CHOLHDL 2.6 09/06/2020   Lab Results  Component Value Date   TSH 0.772 09/06/2020   Lab Results  Component Value Date   HGBA1C 7.4 (H) 05/07/2021   Lab Results  Component Value Date   WBC 8.9 09/06/2020   HGB 13.2 09/06/2020   HCT 40.6 09/06/2020   MCV 84 09/06/2020   PLT 291 09/06/2020   Lab Results  Component Value Date   ALT 19 09/06/2020   AST 18 09/06/2020   ALKPHOS 113 09/06/2020   BILITOT 0.4 09/06/2020   No results found for: "25OHVITD2", "25OHVITD3", "VD25OH"   Review of Systems  Constitutional:  Negative for chills, fatigue and fever.  HENT:  Positive for congestion, postnasal drip and trouble swallowing. Negative for ear pain and sinus pressure.   Respiratory:  Negative for cough and wheezing.   Cardiovascular:  Negative for chest pain.  Gastrointestinal:  Negative for diarrhea and vomiting.  Neurological:  Positive for headaches. Negative for dizziness.  Psychiatric/Behavioral:  Negative for sleep disturbance.     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   Nephrolithiasis 01/23/2017   Microscopic hematuria 08/22/2016   Type II diabetes mellitus with complication ([place]) 26/83/4196   Essential (primary) hypertension 06/06/2014   Hyperlipidemia associated with type 2 diabetes mellitus ([place]) 06/06/2014   Persistent proteinuria associated with type 2 diabetes mellitus ([place]) 06/06/2014   Abnormal liver enzymes 09/12/2011   Status post parathyroidectomy 05/29/2011    Allergies  Allergen Reactions   Ace Inhibitors     Cough   Latex     Latex bandages cause a rash, paper tape is ok   Tape     Paper tape ok    Past Surgical History:  Procedure Laterality Date   COLONOSCOPY WITH PROPOFOL N/A 02/14/2021   Procedure: COLONOSCOPY WITH PROPOFOL;  Surgeon: Lin Landsman, MD;  Location: [place];  Service: Gastroenterology;  Laterality: N/A;   IR NEPHROSTOMY PLACEMENT LEFT  01/23/2017   KIDNEY STONE SURGERY Left 03/13/2005   NEPHROLITHOTOMY Left 01/23/2017   Procedure: NEPHROLITHOTOMY PERCUTANEOUS;  Surgeon: Abbie Sons, MD;  Location: [facility] OR[facility];  Service: Urology;  Laterality: Left;   PARATHYROIDECTOMY  08/11/2011   [facility]   TONSILLECTOMY      Social History   Tobacco Use   Smoking status: Never   Smokeless tobacco: Never  Vaping  Use   Vaping Use: Never used  Substance Use Topics   Alcohol use: No    Alcohol/week: 0.0 standard drinks of alcohol   Drug use: No     Medication list has been reviewed and updated.  Current Meds  Medication Sig   atorvastatin (LIPITOR) 20 MG tablet TAKE 1 TABLET DAILY   Ca Carbonate-Mag Hydroxide (ROLAIDS PO) Take 1 tablet by mouth daily as needed (acid reflux).   Cholecalciferol 50 MCG (2000 UT) CAPS Vitamin D3 50 mcg (2,000 unit) capsule   GLUCOSAMINE HCL-MSM PO Take 1-2 tablets by mouth 2 (two) times daily. Take 2 tablets (1051m) in the morning and 1 tablet  (5039m in the evening   glucose blood (ONE TOUCH ULTRA TEST) test strip Use to test blood sugar daily   losartan (COZAAR) 50 MG tablet TAKE 1 TABLET DAILY   meloxicam (MOBIC) 7.5 MG tablet Take 7.5 mg by mouth as needed.   metFORMIN (GLUCOPHAGE) 500 MG tablet TAKE 1 TABLET 3 TIMES A DAY(NEW DOSE INCREASE)   MULTIPLE VITAMIN PO Take 1 tablet by mouth daily.   OneTouch Delica Lancets [id number] Use 1 each 2 (two) times daily to test blood sugar. Extra fine   potassium citrate (UROCIT-K) 10 MEQ (1080 MG) SR tablet Take 1 tablet (10 mEq total) by mouth daily.       07/22/2021   11:15 AM 05/07/2021    3:23 PM 09/06/2020    8:55 AM 04/16/2020    3:08 PM  GAD 7 : Generalized Anxiety Score  Nervous, Anxious, on Edge  0 0 0  Control/stop worrying 0 0 0 0  Worry too much - different things 0 0 0 0  Trouble relaxing 0 0 0 0  Restless 0 0 0 0  Easily annoyed or irritable 1 0 1 0  Afraid - awful might happen 0 0 0 0  Total GAD 7 Score  0 1 0  Anxiety Difficulty Not difficult at all Not difficult at all Not difficult at all Not difficult at all       07/22/2021   11:15 AM  Depression screen PHQ 2/9  Decreased Interest 1  Down, Depressed, Hopeless 0  PHQ - 2 Score 1  Altered sleeping 1  Tired, decreased energy 0  Change in appetite 0  Feeling bad or failure about yourself  0  Trouble concentrating 0  Moving slowly or fidgety/restless 0  Suicidal thoughts 0  PHQ-9 Score 2  Difficult doing work/chores Not difficult at all    BP Readings from Last 3 Encounters:  07/22/21 136/88  05/07/21 126/60  02/14/21 (!) 103/56    Physical Exam Vitals and nursing note reviewed.  Constitutional:      General: She is not in acute distress.    Appearance: She is well-developed.  HENT:     Head: Normocephalic and atraumatic.     Right Ear: Tympanic membrane and ear canal normal.     Left Ear: Tympanic membrane and ear canal normal.     Nose:     Right Sinus: No maxillary sinus tenderness or  frontal sinus tenderness.     Left Sinus: No maxillary sinus tenderness or frontal sinus tenderness.     Mouth/Throat:     Pharynx: Posterior oropharyngeal erythema present. No pharyngeal swelling.     Tonsils: No tonsillar exudate.  Eyes:     Conjunctiva/sclera: Conjunctivae normal.  Cardiovascular:     Rate and Rhythm: Normal rate and regular rhythm.  Pulmonary:  Effort: Pulmonary effort is normal. No respiratory distress.     Breath sounds: No wheezing or rhonchi.  Musculoskeletal:     Cervical back: Normal range of motion.  Lymphadenopathy:     Cervical: No cervical adenopathy.  Skin:    General: Skin is warm and dry.     Findings: No rash.  Neurological:     Mental Status: She is alert and oriented to person, place, and time.  Psychiatric:        Mood and Affect: Mood normal.        Behavior: Behavior normal.     Wt Readings from Last 3 Encounters:  07/22/21 217 lb (98.4 kg)  05/07/21 214 lb (97.1 kg)  02/14/21 218 lb (98.9 kg)    BP 136/88   Pulse (!) 106   Temp 98.3 F (36.8 C) (Oral)   Ht 5' (1.524 m)   Wt 217 lb (98.4 kg)   SpO2 96%   BMI 42.38 kg/m   Assessment and Plan: 1. Pharyngitis, unspecified etiology Continue Tylenol, warm liquids, anti-histamine for drainage If sinus congestion develops, add Flonase. - azithromycin (ZITHROMAX Z-PAK) 250 MG tablet; UAD  Dispense: 6 each; Refill: 0  2. Exposure to strep throat negative - POCT rapid strep A - negative   Partially dictated using Editor, commissioning. Any errors are unintentional.  Halina Maidens, MD [facility]  07/22/2021

## 2021-07-22 NOTE — Patient Instructions (Signed)
Use Claritim or Allegra daily for post nasal drainage.  If you develop more sinus type symptoms, add Flonase.

## 2021-08-27 ENCOUNTER — Other Ambulatory Visit: Payer: Self-pay | Admitting: Internal Medicine

## 2021-08-27 DIAGNOSIS — I10 Essential (primary) hypertension: Secondary | ICD-10-CM

## 2021-08-28 NOTE — Telephone Encounter (Signed)
Requested Prescriptions  Pending Prescriptions Disp Refills  . losartan (COZAAR) 50 MG tablet [Pharmacy Med Name: LOSARTAN TAB '50MG'$ ] 90 tablet 0    Sig: TAKE 1 TABLET DAILY     Cardiovascular:  Angiotensin Receptor Blockers Failed - 08/27/2021  8:19 AM      Failed - Cr in normal range and within 180 days    Creatinine, Ser  Date Value Ref Range Status  09/06/2020 0.62 0.57 - 1.00 mg/dL Final         Failed - K in normal range and within 180 days    Potassium  Date Value Ref Range Status  09/06/2020 4.6 3.5 - 5.2 mmol/L Final         Passed - Patient is not pregnant      Passed - Last BP in normal range    BP Readings from Last 1 Encounters:  07/22/21 136/88         Passed - Valid encounter within last 6 months    Recent Outpatient Visits          1 month ago Pharyngitis, unspecified etiology   [facility] Glean Hess, MD   3 months ago Type II diabetes mellitus with complication [facility])   [facility] Glean Hess, MD   7 months ago Type II diabetes mellitus with complication [facility])   [facility] Glean Hess, MD   11 months ago Annual physical exam   [facility] Glean Hess, MD   1 year ago Type II diabetes mellitus with complication [facility])   [facility] Glean Hess, MD      Future Appointments            In 3 weeks Army Melia Jesse Sans, MD [facility]

## 2021-09-23 ENCOUNTER — Other Ambulatory Visit: Payer: Self-pay | Admitting: Internal Medicine

## 2021-09-23 NOTE — Telephone Encounter (Signed)
Requested medication (s) are due for refill today: yes  Requested medication (s) are on the active medication list: yes  Last refill:  07/01/21 #90 with 0 RF  Future visit scheduled: tomorrow, seen 05/07/21, and 07/22/21 (for ST)  Notes to clinic:  Failed protocol of labs within 12 months, lipids are greater than 12 months, has upcoming appt tomorrow, please assess.       Requested Prescriptions  Pending Prescriptions Disp Refills   atorvastatin (LIPITOR) 20 MG tablet [Pharmacy Med Name: ATORVASTATIN TAB '20MG'$ ] 90 tablet 0    Sig: TAKE 1 TABLET DAILY     Cardiovascular:  Antilipid - Statins Failed - 09/23/2021  8:12 AM      Failed - Lipid Panel in normal range within the last 12 months    Cholesterol, Total  Date Value Ref Range Status  09/06/2020 149 100 - 199 mg/dL Final   LDL Chol Calc (NIH)  Date Value Ref Range Status  09/06/2020 66 0 - 99 mg/dL Final   HDL  Date Value Ref Range Status  09/06/2020 58 >39 mg/dL Final   Triglycerides  Date Value Ref Range Status  09/06/2020 145 0 - 149 mg/dL Final         Passed - Patient is not pregnant      Passed - Valid encounter within last 12 months    Recent Outpatient Visits           2 months ago Pharyngitis, unspecified etiology   [facility] Glean Hess, MD   4 months ago Type II diabetes mellitus with complication [facility])   [facility] Glean Hess, MD   8 months ago Type II diabetes mellitus with complication [facility])   [facility] Glean Hess, MD   1 year ago Annual physical exam   [facility] Glean Hess, MD   1 year ago Type II diabetes mellitus with complication [facility])   [place], [place], [place]       Future Appointments             Tomorrow Glean Hess, MD [facility], [place]

## 2021-09-24 ENCOUNTER — Encounter: Payer: Self-pay | Admitting: Internal Medicine

## 2021-09-24 ENCOUNTER — Ambulatory Visit (INDEPENDENT_AMBULATORY_CARE_PROVIDER_SITE_OTHER): Payer: BC Managed Care – PPO | Admitting: Internal Medicine

## 2021-09-24 VITALS — BP 128/74 | HR 104 | Ht 60.0 in | Wt 217.0 lb

## 2021-09-24 DIAGNOSIS — E118 Type 2 diabetes mellitus with unspecified complications: Secondary | ICD-10-CM

## 2021-09-24 DIAGNOSIS — Z0000 Encounter for general adult medical examination without abnormal findings: Secondary | ICD-10-CM | POA: Diagnosis not present

## 2021-09-24 DIAGNOSIS — Z1231 Encounter for screening mammogram for malignant neoplasm of breast: Secondary | ICD-10-CM

## 2021-09-24 DIAGNOSIS — I10 Essential (primary) hypertension: Secondary | ICD-10-CM | POA: Diagnosis not present

## 2021-09-24 DIAGNOSIS — E1169 Type 2 diabetes mellitus with other specified complication: Secondary | ICD-10-CM

## 2021-09-24 DIAGNOSIS — E785 Hyperlipidemia, unspecified: Secondary | ICD-10-CM

## 2021-09-24 MED ORDER — ONETOUCH DELICA LANCETS 33G MISC: 12 refills | Status: DC

## 2021-09-24 NOTE — Progress Notes (Signed)
Date:  09/24/2021   Name:  Brittany Forbes   DOB:  20-Sep-1960   MRN:  [id number]   Chief Complaint: Annual Exam (Breast exam no pap ) Brittany Forbes is a 61 y.o. female who presents today for her Complete Annual Exam. She feels well. She reports exercising walking 2-3 times a week. She reports she is sleeping well. Breast complaints none.  Mammogram: 10/2020 DEXA: none Pap smear: 08/2018 neg/neg Colonoscopy: 02/2021 repeat 10 yrs  Health Maintenance Due  Topic Date Due   INFLUENZA VACCINE  09/10/2021    Immunization History  Administered Date(s) Administered   Influenza,inj,Quad PF,6+ Mos 12/20/2014, 10/09/2018   Influenza-Unspecified 11/29/2016, 11/16/2019, 10/26/2020   Moderna Sars-Covid-2 Vaccination 04/09/2019, 05/07/2019   PNEUMOCOCCAL CONJUGATE-20 09/06/2020   Pneumococcal Polysaccharide-23 02/11/2011   Tdap 01/11/2011, 05/07/2021   Zoster Recombinat (Shingrix) 09/20/2019, 12/22/2019   Zoster, Live 05/18/2013    Hypertension This is a chronic problem. The problem is controlled. Pertinent negatives include no chest pain, headaches, palpitations or shortness of breath. Past treatments include angiotensin blockers. The current treatment provides significant improvement.  Diabetes She presents for her follow-up diabetic visit. She has type 2 diabetes mellitus. Pertinent negatives for hypoglycemia include no dizziness, headaches, nervousness/anxiousness or tremors. Pertinent negatives for diabetes include no chest pain, no fatigue, no polydipsia and no polyuria. Current diabetic treatment includes oral agent (monotherapy) (metformin). Her weight is stable. Her breakfast blood glucose is taken between 6-7 am. Her breakfast blood glucose range is generally 180-200 mg/dl. An ACE inhibitor/angiotensin II receptor blocker is being taken. Eye exam is current.  Hyperlipidemia This is a chronic problem. The problem is controlled. Pertinent negatives include no chest pain or shortness of  breath. Current antihyperlipidemic treatment includes statins.    Lab Results  Component Value Date   NA 139 09/06/2020   K 4.6 09/06/2020   CO2 23 09/06/2020   GLUCOSE 132 (H) 09/06/2020   BUN 14 09/06/2020   CREATININE 0.62 09/06/2020   CALCIUM 9.8 09/06/2020   EGFR 101 09/06/2020   GFRNONAA 102 09/05/2019   Lab Results  Component Value Date   CHOL 149 09/06/2020   HDL 58 09/06/2020   LDLCALC 66 09/06/2020   TRIG 145 09/06/2020   CHOLHDL 2.6 09/06/2020   Lab Results  Component Value Date   TSH 0.772 09/06/2020   Lab Results  Component Value Date   HGBA1C 7.4 (H) 05/07/2021   Lab Results  Component Value Date   WBC 8.9 09/06/2020   HGB 13.2 09/06/2020   HCT 40.6 09/06/2020   MCV 84 09/06/2020   PLT 291 09/06/2020   Lab Results  Component Value Date   ALT 19 09/06/2020   AST 18 09/06/2020   ALKPHOS 113 09/06/2020   BILITOT 0.4 09/06/2020   No results found for: "25OHVITD2", "25OHVITD3", "VD25OH"   Review of Systems  Constitutional:  Negative for chills, fatigue and fever.  HENT:  Negative for congestion, hearing loss, tinnitus, trouble swallowing and voice change.   Eyes:  Negative for visual disturbance.  Respiratory:  Negative for cough, chest tightness, shortness of breath and wheezing.   Cardiovascular:  Negative for chest pain, palpitations and leg swelling.  Gastrointestinal:  Negative for abdominal pain, constipation, diarrhea and vomiting.  Endocrine: Negative for polydipsia and polyuria.  Genitourinary:  Negative for dysuria, frequency, genital sores, vaginal bleeding and vaginal discharge.  Musculoskeletal:  Negative for arthralgias, gait problem and joint swelling.  Skin:  Negative for color change and rash.  Neurological:  Negative  for dizziness, tremors, light-headedness and headaches.  Hematological:  Negative for adenopathy. Does not bruise/bleed easily.  Psychiatric/Behavioral:  Negative for dysphoric mood and sleep disturbance. The patient  is not nervous/anxious.     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   Nephrolithiasis 01/23/2017   Microscopic hematuria 08/22/2016   Type II diabetes mellitus with complication ([place]) 36/64/4034   Essential (primary) hypertension 06/06/2014   Hyperlipidemia associated with type 2 diabetes mellitus ([place]) 06/06/2014   Persistent proteinuria associated with type 2 diabetes mellitus ([place]) 06/06/2014   Abnormal liver enzymes 09/12/2011   Status post parathyroidectomy 05/29/2011    Allergies  Allergen Reactions   Ace Inhibitors     Cough   Latex     Latex bandages cause a rash, paper tape is ok   Tape     Paper tape ok    Past Surgical History:  Procedure Laterality Date   COLONOSCOPY WITH PROPOFOL N/A 02/14/2021   Procedure: COLONOSCOPY WITH PROPOFOL;  Surgeon: Lin Landsman, MD;  Location: [place];  Service: Gastroenterology;  Laterality: N/A;   IR NEPHROSTOMY PLACEMENT LEFT  01/23/2017   KIDNEY STONE SURGERY Left 03/13/2005   NEPHROLITHOTOMY Left 01/23/2017   Procedure: NEPHROLITHOTOMY PERCUTANEOUS;  Surgeon: Abbie Sons, MD;  Location: [facility];  Service: Urology;  Laterality: Left;   PARATHYROIDECTOMY  08/11/2011   [facility]   TONSILLECTOMY      Social History   Tobacco Use   Smoking status: Never   Smokeless tobacco: Never  Vaping Use   Vaping Use: Never used  Substance Use Topics   Alcohol use: No    Alcohol/week: 0.0 standard drinks of alcohol   Drug use: No     Medication list has been reviewed and updated.  Current Meds  Medication Sig   Ca Carbonate-Mag Hydroxide (ROLAIDS PO) Take 1 tablet by mouth daily as needed (acid reflux).   Cholecalciferol 50 MCG (2000 UT) CAPS Vitamin D3 50 mcg (2,000 unit) capsule   GLUCOSAMINE HCL-MSM PO Take 1-2 tablets by mouth 2 (two) times daily. Take 2 tablets (1061m) in the morning and 1 tablet (5050m in  the evening   glucose blood (ONE TOUCH ULTRA TEST) test strip Use to test blood sugar daily   losartan (COZAAR) 50 MG tablet TAKE 1 TABLET DAILY   meloxicam (MOBIC) 7.5 MG tablet Take 7.5 mg by mouth as needed.   metFORMIN (GLUCOPHAGE) 500 MG tablet TAKE 1 TABLET 3 TIMES A DAY(NEW DOSE INCREASE)   MULTIPLE VITAMIN PO Take 1 tablet by mouth daily.   potassium citrate (UROCIT-K) 10 MEQ (1080 MG) SR tablet Take 1 tablet (10 mEq total) by mouth daily.   [DISCONTINUED] atorvastatin (LIPITOR) 20 MG tablet TAKE 1 TABLET DAILY   [DISCONTINUED] OneTouch Delica Lancets 3374QISC Use 1 each 2 (two) times daily to test blood sugar. Extra fine       09/24/2021    8:48 AM 07/22/2021   11:15 AM 05/07/2021    3:23 PM 09/06/2020    8:55 AM  GAD 7 : Generalized Anxiety Score  Nervous, Anxious, on Edge 1  0 0  Control/stop worrying 0 0 0 0  Worry too much - different things 1 0 0 0  Trouble relaxing 0 0 0 0  Restless 0 0 0 0  Easily annoyed or irritable 0 1 0 1  Afraid - awful might happen 0 0  0 0  Total GAD 7 Score 2  0 1  Anxiety Difficulty Not difficult at all Not difficult at all Not difficult at all Not difficult at all       09/24/2021    8:48 AM 07/22/2021   11:15 AM 05/07/2021    3:23 PM  Depression screen PHQ 2/9  Decreased Interest 0 1 0  Down, Depressed, Hopeless 1 0 0  PHQ - 2 Score 1 1 0  Altered sleeping _0 Tired, decreased energy 1 0 0  Change in appetite 1 0 0  Feeling bad or failure about yourself  0 0 0  Trouble concentrating 0 0 0  Moving slowly or fidgety/restless 0 0 0  Suicidal thoughts 0 0 0  PHQ-9 Score _1 Difficult doing work/chores Not difficult at all Not difficult at all Not difficult at all    BP Readings from Last 3 Encounters:  09/24/21 128/74  07/22/21 136/88  05/07/21 126/60    Physical Exam Vitals and nursing note reviewed.  Constitutional:      General: She is not in acute distress.    Appearance: She is well-developed.  HENT:     Head:  Normocephalic and atraumatic.     Right Ear: Tympanic membrane and ear canal normal.     Left Ear: Tympanic membrane and ear canal normal.     Nose:     Right Sinus: No maxillary sinus tenderness.     Left Sinus: No maxillary sinus tenderness.     Mouth/Throat:     Mouth: Mucous membranes are moist.     Pharynx: No oropharyngeal exudate.  Eyes:     General: No scleral icterus.       Right eye: No discharge.        Left eye: No discharge.     Conjunctiva/sclera: Conjunctivae normal.  Neck:     Thyroid: No thyromegaly.     Vascular: No carotid bruit.  Cardiovascular:     Rate and Rhythm: Normal rate and regular rhythm.     Pulses: Normal pulses.     Heart sounds: Normal heart sounds.  Pulmonary:     Effort: Pulmonary effort is normal. No respiratory distress.     Breath sounds: No wheezing or rhonchi.  Chest:  Breasts:    Right: No mass, nipple discharge, skin change or tenderness.     Left: No mass, nipple discharge, skin change or tenderness.  Abdominal:     General: Bowel sounds are normal. There is no distension.     Palpations: Abdomen is soft. There is no mass.     Tenderness: There is no abdominal tenderness.  Musculoskeletal:        General: No swelling. Normal range of motion.     Cervical back: Normal range of motion. No erythema.     Right lower leg: No edema.     Left lower leg: No edema.  Lymphadenopathy:     Cervical: No cervical adenopathy.  Skin:    General: Skin is warm and dry.     Capillary Refill: Capillary refill takes less than 2 seconds.     Findings: No lesion or rash.  Neurological:     General: No focal deficit present.     Mental Status: She is alert and oriented to person, place, and time.     Cranial Nerves: No cranial nerve deficit.     Sensory: No sensory deficit.     Deep Tendon Reflexes: Reflexes  are normal and symmetric.  Psychiatric:        Attention and Perception: Attention normal.        Mood and Affect: Mood normal.         Behavior: Behavior normal.     Wt Readings from Last 3 Encounters:  09/24/21 217 lb (98.4 kg)  07/22/21 217 lb (98.4 kg)  05/07/21 214 lb (97.1 kg)    BP 128/74   Pulse (!) 104   Ht 5' (1.524 m)   Wt 217 lb (98.4 kg)   SpO2 95%   BMI 42.38 kg/m   Assessment and Plan: 1. Annual physical exam Exam is normal except for weight. Encourage regular exercise and appropriate dietary changes.  2. Encounter for screening mammogram for breast cancer Schedule at [facility] - MM 3D SCREEN BREAST BILATERAL  3. Essential (primary) hypertension Clinically stable exam with well controlled BP. Tolerating medications without side effects at this time. Pt to continue current regimen and low sodium diet; benefits of regular exercise as able discussed. - CBC with Differential/Platelet - TSH  4. Type II diabetes mellitus with complication (HCC) Clinically stable by exam and report without s/s of hypoglycemia. DM complicated by hypertension and dyslipidemia. Tolerating medications well without side effects or other concerns. Consider adding Farixga - Comprehensive metabolic panel - Hemoglobin A1c - Microalbumin / creatinine urine ratio - OneTouch Delica Lancets 93X MISC; Use 1 each 2 (two) times daily to test blood sugar. Extra fine  Dispense: 100 each; Refill: 12  5. Hyperlipidemia associated with type 2 diabetes mellitus ([place]) Tolerating statin medication without side effects at this time LDL is at goal of < 70 on current dose Continue same therapy without change at this time. - Lipid panel   Partially dictated using Editor, commissioning. Any errors are unintentional.  Halina Maidens, MD [facility]  09/24/2021

## 2021-09-25 LAB — MICROALBUMIN / CREATININE URINE RATIO
Creatinine, Urine: 103 mg/dL
Microalb/Creat Ratio: 65 mg/g creat — ABNORMAL HIGH (ref 0–29)
Microalbumin, Urine: 66.7 ug/mL

## 2021-09-25 LAB — CBC WITH DIFFERENTIAL/PLATELET
Basophils Absolute: 0.1 10*3/uL (ref 0.0–0.2)
Basos: 1 %
EOS (ABSOLUTE): 0.3 10*3/uL (ref 0.0–0.4)
Eos: 3 %
Hematocrit: 42.6 % (ref 34.0–46.6)
Hemoglobin: 14.1 g/dL (ref 11.1–15.9)
Immature Grans (Abs): 0 10*3/uL (ref 0.0–0.1)
Immature Granulocytes: 0 %
Lymphocytes Absolute: 3.2 10*3/uL — ABNORMAL HIGH (ref 0.7–3.1)
Lymphs: 33 %
MCH: 28 pg (ref 26.6–33.0)
MCHC: 33.1 g/dL (ref 31.5–35.7)
MCV: 85 fL (ref 79–97)
Monocytes Absolute: 0.6 10*3/uL (ref 0.1–0.9)
Monocytes: 6 %
Neutrophils Absolute: 5.7 10*3/uL (ref 1.4–7.0)
Neutrophils: 57 %
Platelets: 300 10*3/uL (ref 150–450)
RBC: 5.03 x10E6/uL (ref 3.77–5.28)
RDW: 13.3 % (ref 11.7–15.4)
WBC: 9.9 10*3/uL (ref 3.4–10.8)

## 2021-09-25 LAB — COMPREHENSIVE METABOLIC PANEL
ALT: 27 IU/L (ref 0–32)
AST: 20 IU/L (ref 0–40)
Albumin/Globulin Ratio: 2 (ref 1.2–2.2)
Albumin: 4.7 g/dL (ref 3.9–4.9)
Alkaline Phosphatase: 130 IU/L — ABNORMAL HIGH (ref 44–121)
BUN/Creatinine Ratio: 21 (ref 12–28)
BUN: 15 mg/dL (ref 8–27)
Bilirubin Total: 0.4 mg/dL (ref 0.0–1.2)
CO2: 23 mmol/L (ref 20–29)
Calcium: 10.2 mg/dL (ref 8.7–10.3)
Chloride: 99 mmol/L (ref 96–106)
Creatinine, Ser: 0.73 mg/dL (ref 0.57–1.00)
Globulin, Total: 2.4 g/dL (ref 1.5–4.5)
Glucose: 139 mg/dL — ABNORMAL HIGH (ref 70–99)
Potassium: 4.5 mmol/L (ref 3.5–5.2)
Sodium: 139 mmol/L (ref 134–144)
Total Protein: 7.1 g/dL (ref 6.0–8.5)
eGFR: 92 mL/min/{1.73_m2} (ref >59)

## 2021-09-25 LAB — LIPID PANEL
Chol/HDL Ratio: 3.2 ratio (ref 0.0–4.4)
Cholesterol, Total: 171 mg/dL (ref 100–199)
HDL: 54 mg/dL (ref >39)
LDL Chol Calc (NIH): 85 mg/dL (ref 0–99)
Triglycerides: 192 mg/dL — ABNORMAL HIGH (ref 0–149)
VLDL Cholesterol Cal: 32 mg/dL (ref 5–40)

## 2021-09-25 LAB — TSH: TSH: 0.749 u[IU]/mL (ref 0.450–4.500)

## 2021-09-25 LAB — HEMOGLOBIN A1C
Est. average glucose Bld gHb Est-mCnc: 169 mg/dL
Hgb A1c MFr Bld: 7.5 % — ABNORMAL HIGH (ref 4.8–5.6)

## 2021-10-12 ENCOUNTER — Other Ambulatory Visit: Payer: Self-pay | Admitting: Internal Medicine

## 2021-10-12 DIAGNOSIS — R809 Proteinuria, unspecified: Secondary | ICD-10-CM

## 2021-10-15 NOTE — Telephone Encounter (Signed)
Requested Prescriptions  Pending Prescriptions Disp Refills  . metFORMIN (GLUCOPHAGE) 500 MG tablet [Pharmacy Med Name: METFORMIN TAB 500MG] 270 tablet 1    Sig: TAKE 1 TABLET 3 TIMES A DAY(NEW DOSE INCREASE)     Endocrinology:  Diabetes - Biguanides Failed - 10/12/2021  8:11 AM      Failed - B12 Level in normal range and within 720 days    No results found for: "VITAMINB12"       Passed - Cr in normal range and within 360 days    Creatinine, Ser  Date Value Ref Range Status  09/24/2021 0.73 0.57 - 1.00 mg/dL Final         Passed - HBA1C is between 0 and 7.9 and within 180 days    Hgb A1c MFr Bld  Date Value Ref Range Status  09/24/2021 7.5 (H) 4.8 - 5.6 % Final    Comment:             Prediabetes: 5.7 - 6.4          Diabetes: >6.4          Glycemic control for adults with diabetes: <7.0          Passed - eGFR in normal range and within 360 days    GFR calc Af Amer  Date Value Ref Range Status  09/05/2019 117 >59 mL/min/1.73 Final    Comment:    **[facility] currently reports eGFR in compliance with the current**   recommendations of the [employer]. [facility] will   update reporting as new guidelines are published from the NKF-ASN   Task force.    GFR calc non Af Amer  Date Value Ref Range Status  09/05/2019 102 >59 mL/min/1.73 Final   eGFR  Date Value Ref Range Status  09/24/2021 92 >59 mL/min/1.73 Final         Passed - Valid encounter within last 6 months    Recent Outpatient Visits          3 weeks ago Annual physical exam   [place] [facility] at [facility], Jesse Sans, MD   2 months ago Pharyngitis, unspecified etiology   [place] [facility] at [facility], Jesse Sans, MD   5 months ago Type II diabetes mellitus with complication [facility])   [place] [facility] at [facility], Jesse Sans, MD   9 months ago Type II diabetes mellitus with  complication [facility])   [place] [facility] at [facility], Jesse Sans, MD   1 year ago Annual physical exam   [place] [facility] at [facility], Jesse Sans, MD      Future Appointments            In 3 months Army Melia Jesse Sans, MD [facility] at [facility], [facility]   In 11 months Glean Hess, MD [facility] and [facility] Medicine at [facility] [place] within normal limits and completed in the last 12 months    WBC  Date Value Ref Range Status  09/24/2021 9.9 3.4 - 10.8 x10E3/uL Final  12/31/2017 12.0 (H) 4.0 - 10.5 K/uL Final   RBC  Date Value Ref Range Status  09/24/2021 5.03 3.77 - 5.28 x10E6/uL Final  12/31/2017 4.47 3.87 -  5.11 MIL/uL Final   Hemoglobin  Date Value Ref Range Status  09/24/2021 14.1 11.1 - 15.9 g/dL Final   Hematocrit  Date Value Ref Range Status  09/24/2021 42.6 34.0 - 46.6 % Final   MCHC  Date Value Ref Range Status  09/24/2021 33.1 31.5 - 35.7 g/dL Final  12/31/2017 32.6 30.0 - 36.0 g/dL Final   [facility]  Date Value Ref Range Status  09/24/2021 28.0 26.6 - 33.0 pg Final  12/31/2017 27.3 26.0 - 34.0 pg Final   MCV  Date Value Ref Range Status  09/24/2021 85 79 - 97 fL Final   No results found for: "PLTCOUNTKUC", "LABPLAT", "POCPLA" RDW  Date Value Ref Range Status  09/24/2021 13.3 11.7 - 15.4 % Final

## 2021-11-08 ENCOUNTER — Ambulatory Visit
Admission: RE | Admit: 2021-11-08 | Discharge: 2021-11-08 | Disposition: A | Discharge status: Home / Self Care | Payer: BC Managed Care – PPO | Source: Ambulatory Visit | Attending: Internal Medicine | Admitting: Internal Medicine

## 2021-11-08 DIAGNOSIS — Z1231 Encounter for screening mammogram for malignant neoplasm of breast: Secondary | ICD-10-CM | POA: Diagnosis not present

## 2021-11-18 ENCOUNTER — Other Ambulatory Visit: Payer: Self-pay | Admitting: Internal Medicine

## 2021-11-18 DIAGNOSIS — I10 Essential (primary) hypertension: Secondary | ICD-10-CM

## 2021-11-19 NOTE — Telephone Encounter (Signed)
Requested Prescriptions  Pending Prescriptions Disp Refills  . losartan (COZAAR) 50 MG tablet [Pharmacy Med Name: LOSARTAN TAB '50MG'$ ] 90 tablet 0    Sig: TAKE 1 TABLET DAILY     Cardiovascular:  Angiotensin Receptor Blockers Passed - 11/18/2021  8:14 AM      Passed - Cr in normal range and within 180 days    Creatinine, Ser  Date Value Ref Range Status  09/24/2021 0.73 0.57 - 1.00 mg/dL Final         Passed - K in normal range and within 180 days    Potassium  Date Value Ref Range Status  09/24/2021 4.5 3.5 - 5.2 mmol/L Final         Passed - Patient is not pregnant      Passed - Last BP in normal range    BP Readings from Last 1 Encounters:  09/24/21 128/74         Passed - Valid encounter within last 6 months    Recent Outpatient Visits          1 month ago Annual physical exam   [place] [facility] at [facility], Jesse Sans, MD   4 months ago Pharyngitis, unspecified etiology   [place] [facility] at [facility], Jesse Sans, MD   6 months ago Type II diabetes mellitus with complication [facility])   [place] [facility] at [facility], Jesse Sans, MD   10 months ago Type II diabetes mellitus with complication [facility])   [place] [facility] at [facility], Jesse Sans, MD   1 year ago Annual physical exam   [facility] at [facility], Jesse Sans, MD      Future Appointments            In 2 months Army Melia, Jesse Sans, MD [facility] Care and [facility] Medicine at [facility], [facility]   In 10 months Army Melia, Jesse Sans, MD [place] [facility] at [facility], [facility]

## 2021-12-30 LAB — HM DIABETES EYE EXAM

## 2022-01-20 ENCOUNTER — Encounter: Payer: Self-pay | Admitting: Internal Medicine

## 2022-01-24 ENCOUNTER — Encounter: Payer: Self-pay | Admitting: Internal Medicine

## 2022-01-24 ENCOUNTER — Ambulatory Visit: Payer: BC Managed Care – PPO | Admitting: Internal Medicine

## 2022-01-24 VITALS — BP 128/76 | HR 104 | Ht 60.0 in | Wt 219.0 lb

## 2022-01-24 DIAGNOSIS — R809 Proteinuria, unspecified: Secondary | ICD-10-CM

## 2022-01-24 DIAGNOSIS — E1129 Type 2 diabetes mellitus with other diabetic kidney complication: Secondary | ICD-10-CM

## 2022-01-24 DIAGNOSIS — E118 Type 2 diabetes mellitus with unspecified complications: Secondary | ICD-10-CM

## 2022-01-24 DIAGNOSIS — I10 Essential (primary) hypertension: Secondary | ICD-10-CM | POA: Diagnosis not present

## 2022-01-24 LAB — POCT GLYCOSYLATED HEMOGLOBIN (HGB A1C): Hemoglobin A1C: 7.4 % — AB (ref 4.0–5.6)

## 2022-01-24 MED ORDER — LOSARTAN POTASSIUM 50 MG PO TABS: 50.0000 mg | ORAL_TABLET | Freq: Every day | ORAL | 1 refills | Status: DC

## 2022-01-24 NOTE — Progress Notes (Signed)
Date:  01/24/2022   Name:  Brittany Forbes   DOB:  11/25/1960   MRN:  [id number]   Chief Complaint: Diabetes and Hypertension  Diabetes She presents for her follow-up diabetic visit. She has type 2 diabetes mellitus. Her disease course has been stable (mild proteinuria noted). Pertinent negatives for hypoglycemia include no headaches or tremors. Pertinent negatives for diabetes include no chest pain, no fatigue, no polydipsia and no polyuria. Pertinent negatives for diabetic complications include no CVA. Current diabetic treatment includes oral agent (monotherapy) (metformin). An ACE inhibitor/angiotensin II receptor blocker is being taken.  Hypertension This is a chronic problem. The problem is controlled. Pertinent negatives include no chest pain, headaches, palpitations or shortness of breath. Past treatments include angiotensin blockers. The current treatment provides significant improvement. There are no compliance problems.  There is no history of kidney disease, CAD/MI or CVA.    Lab Results  Component Value Date   NA 139 09/24/2021   K 4.5 09/24/2021   CO2 23 09/24/2021   GLUCOSE 139 (H) 09/24/2021   BUN 15 09/24/2021   CREATININE 0.73 09/24/2021   CALCIUM 10.2 09/24/2021   EGFR 92 09/24/2021   GFRNONAA 102 09/05/2019   Lab Results  Component Value Date   CHOL 171 09/24/2021   HDL 54 09/24/2021   LDLCALC 85 09/24/2021   TRIG 192 (H) 09/24/2021   CHOLHDL 3.2 09/24/2021   Lab Results  Component Value Date   TSH 0.749 09/24/2021   Lab Results  Component Value Date   HGBA1C 7.5 (H) 09/24/2021   Lab Results  Component Value Date   WBC 9.9 09/24/2021   HGB 14.1 09/24/2021   HCT 42.6 09/24/2021   MCV 85 09/24/2021   PLT 300 09/24/2021   Lab Results  Component Value Date   ALT 27 09/24/2021   AST 20 09/24/2021   ALKPHOS 130 (H) 09/24/2021   BILITOT 0.4 09/24/2021   No results found for: "25OHVITD2", "25OHVITD3", "VD25OH"   Review of Systems   Constitutional:  Negative for appetite change, fatigue, fever and unexpected weight change.  HENT:  Negative for tinnitus and trouble swallowing.   Eyes:  Negative for visual disturbance.  Respiratory:  Negative for cough, chest tightness and shortness of breath.   Cardiovascular:  Negative for chest pain, palpitations and leg swelling.  Gastrointestinal:  Negative for abdominal pain.  Endocrine: Negative for polydipsia and polyuria.  Genitourinary:  Negative for dysuria and hematuria.  Musculoskeletal:  Positive for arthralgias and gait problem.  Neurological:  Negative for tremors, numbness and headaches.  Psychiatric/Behavioral:  Negative for dysphoric mood.     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    Allergies  Allergen Reactions   Ace Inhibitors     Cough   Latex     Latex bandages cause a rash, paper tape is ok   Tape     Paper tape ok    Past Surgical History:  Procedure Laterality Date   COLONOSCOPY WITH PROPOFOL N/A 02/14/2021   Procedure: COLONOSCOPY WITH PROPOFOL;  Surgeon: Lin Landsman, MD;  Location: [facility] ENDOSCOPY;  Service: Gastroenterology;  Laterality: N/A;   IR NEPHROSTOMY PLACEMENT LEFT  01/23/2017   KIDNEY STONE SURGERY Left 03/13/2005   NEPHROLITHOTOMY Left 01/23/2017   Procedure: NEPHROLITHOTOMY PERCUTANEOUS;  Surgeon: Abbie Sons, MD;  Location: [facility] ORS;  Service: Urology;  Laterality: Left;   PARATHYROIDECTOMY  08/11/2011   [facility]    TONSILLECTOMY      Social History   Tobacco Use   Smoking status: Never   Smokeless tobacco: Never  Vaping Use   Vaping Use: Never used  Substance Use Topics   Alcohol use: No    Alcohol/week: 0.0 standard drinks of alcohol   Drug use: No     Medication list has been reviewed and updated.  Current Meds  Medication Sig   atorvastatin (LIPITOR) 20 MG tablet TAKE 1 TABLET DAILY   Ca Carbonate-Mag Hydroxide (ROLAIDS PO) Take 1 tablet by mouth daily as needed (acid reflux).   Cholecalciferol 50 MCG (2000 UT) CAPS Vitamin D3 50 mcg (2,000 unit) capsule   GLUCOSAMINE HCL-MSM PO Take 1-2 tablets by mouth 2 (two) times daily. Take 2 tablets (1069m) in the morning and 1 tablet (5028m in the evening   glucose blood (ONE TOUCH ULTRA TEST) test strip Use to test blood sugar daily   losartan (COZAAR) 50 MG tablet TAKE 1 TABLET DAILY   meloxicam (MOBIC) 7.5 MG tablet Take 7.5 mg by mouth as needed.   metFORMIN (GLUCOPHAGE) 500 MG tablet TAKE 1 TABLET 3 TIMES A DAY(NEW DOSE INCREASE)   MULTIPLE VITAMIN PO Take 1 tablet by mouth daily.   OneTouch Delica Lancets [id number] Use 1 each 2 (two) times daily to test blood sugar. Extra fine   potassium citrate (UROCIT-K) 10 MEQ (1080 MG) SR tablet Take 1 tablet (10 mEq total) by mouth daily. (Patient taking differently: Take 10 mEq by mouth 3 (three) times a week.)       09/24/2021    8:48 AM 07/22/2021   11:15 AM 05/07/2021    3:23 PM 09/06/2020    8:55 AM  GAD 7 : Generalized Anxiety Score  Nervous, Anxious, on Edge 1  0 0  Control/stop worrying 0 0 0 0  Worry too much - different things 1 0 0 0  Trouble relaxing 0 0 0 0  Restless 0 0 0 0  Easily annoyed or irritable 0 1 0 1  Afraid - awful might happen 0 0 0 0  Total GAD 7 Score 2  0 1  Anxiety Difficulty Not difficult at all Not difficult at all Not difficult at all Not difficult at all       09/24/2021    8:48 AM 07/22/2021   11:15 AM 05/07/2021    3:23 PM  Depression screen PHQ 2/9   Decreased Interest 0 1 0  Down, Depressed, Hopeless 1 0 0  PHQ - 2 Score 1 1 0  Altered sleeping _0 Tired, decreased energy 1 0 0  Change in appetite 1 0 0  Feeling bad or failure about yourself  0 0 0  Trouble concentrating 0 0 0  Moving slowly or fidgety/restless 0 0 0  Suicidal thoughts 0 0 0  PHQ-9 Score _1 Difficult doing work/chores Not difficult at all Not difficult at all Not difficult at all    BP Readings from Last 3 Encounters:  01/24/22 128/76  09/24/21 128/74  07/22/21 136/88    Physical Exam Vitals  and nursing note reviewed.  Constitutional:      General: She is not in acute distress.    Appearance: She is well-developed.  HENT:     Head: Normocephalic and atraumatic.  Cardiovascular:     Rate and Rhythm: Normal rate and regular rhythm.     Pulses: Normal pulses.     Heart sounds: No murmur heard. Pulmonary:     Effort: Pulmonary effort is normal. No respiratory distress.     Breath sounds: No wheezing or rhonchi.  Musculoskeletal:     Cervical back: Normal range of motion.     Right lower leg: No edema.     Left lower leg: No edema.  Skin:    General: Skin is warm and dry.     Findings: No rash.  Neurological:     General: No focal deficit present.     Mental Status: She is alert and oriented to person, place, and time.  Psychiatric:        Mood and Affect: Mood normal.        Behavior: Behavior normal.     Wt Readings from Last 3 Encounters:  01/24/22 219 lb (99.3 kg)  09/24/21 217 lb (98.4 kg)  07/22/21 217 lb (98.4 kg)    BP 128/76   Pulse (!) 104   Ht 5' (1.524 m)   Wt 219 lb (99.3 kg)   SpO2 95%   BMI 42.77 kg/m   Assessment and Plan: 1. Type II diabetes mellitus with complication (HCC) Clinically stable by exam and report without s/s of hypoglycemia. DM complicated by hypertension and dyslipidemia. Tolerating medications well without side effects or other concerns. Will add Jardiance 25 mg daily for renal protection -  samples given - POCT glycosylated hemoglobin (Hb A1C)= 7.4  2. Essential (primary) hypertension Clinically stable exam with well controlled BP. Tolerating medications without side effects at this time. Pt to continue current regimen and low sodium diet; benefits of regular exercise as able discussed.   Partially dictated using Editor, commissioning. Any errors are unintentional.  Halina Maidens, MD [facility]  01/24/2022

## 2022-02-14 ENCOUNTER — Encounter: Payer: Self-pay | Admitting: Internal Medicine

## 2022-02-16 ENCOUNTER — Other Ambulatory Visit: Payer: Self-pay | Admitting: Internal Medicine

## 2022-02-16 DIAGNOSIS — I10 Essential (primary) hypertension: Secondary | ICD-10-CM

## 2022-02-16 DIAGNOSIS — E118 Type 2 diabetes mellitus with unspecified complications: Secondary | ICD-10-CM

## 2022-02-16 MED ORDER — EMPAGLIFLOZIN 25 MG PO TABS: 25.0000 mg | ORAL_TABLET | Freq: Every day | ORAL | 1 refills | Status: DC

## 2022-02-17 NOTE — Telephone Encounter (Signed)
Unable to refill per protocol, Rx request is too soon. Last refill 01/24/22 for 90 and 1 refill.  Requested Prescriptions  Pending Prescriptions Disp Refills   losartan (COZAAR) 50 MG tablet [Pharmacy Med Name: LOSARTAN TAB '50MG'$ ] 90 tablet 0    Sig: TAKE 1 TABLET DAILY     Cardiovascular:  Angiotensin Receptor Blockers Passed - 02/16/2022  8:14 PM      Passed - Cr in normal range and within 180 days    Creatinine, Ser  Date Value Ref Range Status  09/24/2021 0.73 0.57 - 1.00 mg/dL Final         Passed - K in normal range and within 180 days    Potassium  Date Value Ref Range Status  09/24/2021 4.5 3.5 - 5.2 mmol/L Final         Passed - Patient is not pregnant      Passed - Last BP in normal range    BP Readings from Last 1 Encounters:  01/24/22 128/76         Passed - Valid encounter within last 6 months    Recent Outpatient Visits           3 weeks ago Type II diabetes mellitus with complication ([place])   [place] [facility] at [facility], Jesse Sans, MD   4 months ago Annual physical exam   [place] [facility] at [facility], Jesse Sans, MD   7 months ago Pharyngitis, unspecified etiology   [place] [facility] at [facility], Jesse Sans, MD   9 months ago Type II diabetes mellitus with complication [facility])   [place] [facility] at [facility], Jesse Sans, MD   1 year ago Type II diabetes mellitus with complication [facility])   [place] [facility] at [facility], Jesse Sans, MD       Future Appointments             In 3 months [facility] Melia, Jesse Sans, MD [facility] at [facility], [facility]   In 7 months Army Melia, Jesse Sans, MD [place] [facility] at [facility], [facility]

## 2022-03-21 ENCOUNTER — Other Ambulatory Visit: Payer: Self-pay | Admitting: Internal Medicine

## 2022-03-21 NOTE — Telephone Encounter (Signed)
Requested Prescriptions  Pending Prescriptions Disp Refills   atorvastatin (LIPITOR) 20 MG tablet [Pharmacy Med Name: ATORVASTATIN TAB 20MG] 90 tablet 1    Sig: TAKE 1 TABLET DAILY     Cardiovascular:  Antilipid - Statins Failed - 03/21/2022  8:13 AM      Failed - Lipid Panel in normal range within the last 12 months    Cholesterol, Total  Date Value Ref Range Status  09/24/2021 171 100 - 199 mg/dL Final   LDL Chol Calc (NIH)  Date Value Ref Range Status  09/24/2021 85 0 - 99 mg/dL Final   HDL  Date Value Ref Range Status  09/24/2021 54 >39 mg/dL Final   Triglycerides  Date Value Ref Range Status  09/24/2021 192 (H) 0 - 149 mg/dL Final         Passed - Patient is not pregnant      Passed - Valid encounter within last 12 months    Recent Outpatient Visits           1 month ago Type II diabetes mellitus with complication ([place])   [place] [facility] [place], Jesse Sans, MD   5 months ago Annual physical exam   [place] at [facility], Jesse Sans, MD   8 months ago Pharyngitis, unspecified etiology   [place] at [facility], Jesse Sans, MD   10 months ago Type II diabetes mellitus with complication [facility])   [place] [facility] at [facility], Jesse Sans, MD   1 year ago Type II diabetes mellitus with complication [facility])   [place] at [facility], Jesse Sans, MD       Future Appointments             In 2 months Army Melia, Jesse Sans, MD [place] at [facility], [facility]   In 6 months Army Melia, Jesse Sans, MD [place] at [facility], [facility]

## 2022-04-10 ENCOUNTER — Other Ambulatory Visit: Payer: Self-pay | Admitting: Internal Medicine

## 2022-04-10 DIAGNOSIS — E1129 Type 2 diabetes mellitus with other diabetic kidney complication: Secondary | ICD-10-CM

## 2022-05-12 ENCOUNTER — Encounter: Payer: Self-pay | Admitting: Internal Medicine

## 2022-05-12 ENCOUNTER — Ambulatory Visit: Payer: BC Managed Care – PPO | Admitting: Internal Medicine

## 2022-05-12 VITALS — BP 128/72 | HR 95 | Ht 60.0 in | Wt 195.0 lb

## 2022-05-12 DIAGNOSIS — I10 Essential (primary) hypertension: Secondary | ICD-10-CM | POA: Diagnosis not present

## 2022-05-12 DIAGNOSIS — M170 Bilateral primary osteoarthritis of knee: Secondary | ICD-10-CM | POA: Diagnosis not present

## 2022-05-12 DIAGNOSIS — R809 Proteinuria, unspecified: Secondary | ICD-10-CM

## 2022-05-12 DIAGNOSIS — E1129 Type 2 diabetes mellitus with other diabetic kidney complication: Secondary | ICD-10-CM | POA: Diagnosis not present

## 2022-05-12 DIAGNOSIS — E118 Type 2 diabetes mellitus with unspecified complications: Secondary | ICD-10-CM

## 2022-05-12 LAB — POCT GLYCOSYLATED HEMOGLOBIN (HGB A1C): Hemoglobin A1C: 7.9 % — AB (ref 4.0–5.6)

## 2022-05-12 MED ORDER — GLUCOSE BLOOD VI STRP: ORAL_STRIP | 3 refills | Status: DC

## 2022-05-12 NOTE — Assessment & Plan Note (Addendum)
Clinically stable without s/s of hypoglycemia.  Now on [employer] and has lost 25 lbs. Tolerating metformin and Jardiance well without side effects or other concerns. Lab Results  Component Value Date   HGBA1C 7.4 (A) 01/24/2022  A1C today is 7.9 which is surprising in view of her weight loss. Will obtain A1C at the lab to confirm.

## 2022-05-12 NOTE — Assessment & Plan Note (Signed)
Doing well with weight loss - BMI now < 40

## 2022-05-12 NOTE — Progress Notes (Signed)
Date:  05/12/2022   Name:  Brittany Forbes   DOB:  1960-03-15   MRN:  [id number]   Chief Complaint: Diabetes and Hypertension  Diabetes She presents for her follow-up diabetic visit. She has type 2 diabetes mellitus. Her disease course has been improving. Pertinent negatives for hypoglycemia include no headaches or tremors. Pertinent negatives for diabetes include no chest pain, no fatigue, no polydipsia and no polyuria. Current diabetic treatments: metformin and Jardiance. She is compliant with treatment all of the time. Her weight is decreasing steadily (has lost 25 lbs with [employer]). There is no change in her home blood glucose trend.  Hypertension This is a chronic problem. The problem is controlled. Pertinent negatives include no chest pain, headaches, palpitations or shortness of breath. Past treatments include angiotensin blockers. The current treatment provides significant improvement.    Lab Results  Component Value Date   NA 139 09/24/2021   K 4.5 09/24/2021   CO2 23 09/24/2021   GLUCOSE 139 (H) 09/24/2021   BUN 15 09/24/2021   CREATININE 0.73 09/24/2021   CALCIUM 10.2 09/24/2021   EGFR 92 09/24/2021   GFRNONAA 102 09/05/2019   Lab Results  Component Value Date   CHOL 171 09/24/2021   HDL 54 09/24/2021   LDLCALC 85 09/24/2021   TRIG 192 (H) 09/24/2021   CHOLHDL 3.2 09/24/2021   Lab Results  Component Value Date   TSH 0.749 09/24/2021   Lab Results  Component Value Date   HGBA1C 7.9 (A) 05/12/2022   Lab Results  Component Value Date   WBC 9.9 09/24/2021   HGB 14.1 09/24/2021   HCT 42.6 09/24/2021   MCV 85 09/24/2021   PLT 300 09/24/2021   Lab Results  Component Value Date   ALT 27 09/24/2021   AST 20 09/24/2021   ALKPHOS 130 (H) 09/24/2021   BILITOT 0.4 09/24/2021   No results found for: "25OHVITD2", "25OHVITD3", "VD25OH"   Review of Systems  Constitutional:  Negative for appetite change, fatigue, fever and unexpected weight change.  HENT:  Negative  for tinnitus and trouble swallowing.   Eyes:  Negative for visual disturbance.  Respiratory:  Negative for cough, chest tightness and shortness of breath.   Cardiovascular:  Negative for chest pain, palpitations and leg swelling.  Gastrointestinal:  Negative for abdominal pain.  Endocrine: Negative for polydipsia and polyuria.  Genitourinary:  Negative for dysuria and hematuria.  Musculoskeletal:  Negative for arthralgias.  Neurological:  Negative for tremors, numbness and headaches.  Psychiatric/Behavioral:  Negative for dysphoric mood.     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   Nephrolithiasis 01/23/2017   Microscopic hematuria 08/22/2016   Type II diabetes mellitus with complication 123456   Essential (primary) hypertension 06/06/2014   Hyperlipidemia associated with type 2 diabetes mellitus ([place]) 06/06/2014   Persistent proteinuria associated with type 2 diabetes mellitus 06/06/2014   Abnormal liver enzymes 09/12/2011   Status post parathyroidectomy 05/29/2011    Allergies  Allergen Reactions   Ace Inhibitors     Cough   Latex     Latex bandages cause a rash, paper tape is ok   Tape     Paper tape ok    Past Surgical History:  Procedure Laterality Date   COLONOSCOPY WITH PROPOFOL N/A 02/14/2021   Procedure: COLONOSCOPY WITH PROPOFOL;  Surgeon: Lin Landsman,  MD;  Location: [facility] ENDOSCOPY;  Service: Gastroenterology;  Laterality: N/A;   IR NEPHROSTOMY PLACEMENT LEFT  01/23/2017   KIDNEY STONE SURGERY Left 03/13/2005   NEPHROLITHOTOMY Left 01/23/2017   Procedure: NEPHROLITHOTOMY PERCUTANEOUS;  Surgeon: Abbie Sons, MD;  Location: [facility] ORS;  Service: Urology;  Laterality: Left;   PARATHYROIDECTOMY  08/11/2011   [facility]   TONSILLECTOMY      Social History   Tobacco Use   Smoking status: Never    Smokeless tobacco: Never  Vaping Use   Vaping Use: Never used  Substance Use Topics   Alcohol use: No    Alcohol/week: 0.0 standard drinks of alcohol   Drug use: No     Medication list has been reviewed and updated.  Current Meds  Medication Sig   atorvastatin (LIPITOR) 20 MG tablet TAKE 1 TABLET DAILY   Ca Carbonate-Mag Hydroxide (ROLAIDS PO) Take 1 tablet by mouth daily as needed (acid reflux).   Cholecalciferol 50 MCG (2000 UT) CAPS Vitamin D3 50 mcg (2,000 unit) capsule   empagliflozin (JARDIANCE) 25 MG TABS tablet Take 1 tablet (25 mg total) by mouth daily before breakfast.   GLUCOSAMINE HCL-MSM PO Take 1-2 tablets by mouth 2 (two) times daily. Take 2 tablets (1000mg ) in the morning and 1 tablet (500mg ) in the evening   losartan (COZAAR) 50 MG tablet Take 1 tablet (50 mg total) by mouth daily.   meloxicam (MOBIC) 7.5 MG tablet Take 7.5 mg by mouth as needed.   metFORMIN (GLUCOPHAGE) 500 MG tablet TAKE 1 TABLET 3 TIMES A DAY(NEW DOSE INCREASE)   MULTIPLE VITAMIN PO Take 1 tablet by mouth daily.   OneTouch Delica Lancets 99991111 MISC Use 1 each 2 (two) times daily to test blood sugar. Extra fine   potassium citrate (UROCIT-K) 10 MEQ (1080 MG) SR tablet Take 1 tablet (10 mEq total) by mouth daily. (Patient taking differently: Take 10 mEq by mouth 3 (three) times a week.)   [DISCONTINUED] glucose blood (ONE TOUCH ULTRA TEST) test strip Use to test blood sugar daily       05/12/2022   11:22 AM 01/24/2022    3:33 PM 09/24/2021    8:48 AM 07/22/2021   11:15 AM  GAD 7 : Generalized Anxiety Score  Nervous, Anxious, on Edge 0 0 1   Control/stop worrying 0 0 0 0  Worry too much - different things 0 1 1 0  Trouble relaxing 0 1 0 0  Restless 0 0 0 0  Easily annoyed or irritable 1 2 0 1  Afraid - awful might happen 0 0 0 0  Total GAD 7 Score 1 4 2    Anxiety Difficulty Not difficult at all Somewhat difficult Not difficult at all Not difficult at all       05/12/2022   11:22 AM 01/24/2022     3:33 PM 09/24/2021    8:48 AM  Depression screen PHQ 2/9  Decreased Interest 1 1 0  Down, Depressed, Hopeless 0 0 1  PHQ - 2 Score 1 1 1   Altered sleeping 1 1 1   Tired, decreased energy 1 1 1   Change in appetite 0 1 1  Feeling bad or failure about yourself  0 0 0  Trouble concentrating 1 1 0  Moving slowly or fidgety/restless 0 0 0  Suicidal thoughts 0 0 0  PHQ-9 Score 4 5 4   Difficult doing work/chores Not difficult at all Somewhat difficult Not difficult at all    BP Readings from Last 3  Encounters:  05/12/22 128/72  01/24/22 128/76  09/24/21 128/74    Physical Exam Vitals and nursing note reviewed.  Constitutional:      General: She is not in acute distress.    Appearance: She is well-developed.  HENT:     Head: Normocephalic and atraumatic.  Neck:     Vascular: No carotid bruit.  Cardiovascular:     Rate and Rhythm: Normal rate and regular rhythm.  Pulmonary:     Effort: Pulmonary effort is normal. No respiratory distress.     Breath sounds: No wheezing or rhonchi.  Musculoskeletal:     Cervical back: Normal range of motion.  Lymphadenopathy:     Cervical: No cervical adenopathy.  Skin:    General: Skin is warm and dry.     Findings: No rash.  Neurological:     General: No focal deficit present.     Mental Status: She is alert and oriented to person, place, and time.  Psychiatric:        Mood and Affect: Mood normal.        Behavior: Behavior normal.     Wt Readings from Last 3 Encounters:  05/12/22 195 lb (88.5 kg)  01/24/22 219 lb (99.3 kg)  09/24/21 217 lb (98.4 kg)    BP 128/72   Pulse 95   Ht 5' (1.524 m)   Wt 195 lb (88.5 kg)   SpO2 98%   BMI 38.08 kg/m   Assessment and Plan:  Problem List Items Addressed This Visit       Cardiovascular and Mediastinum   Essential (primary) hypertension - Primary (Chronic)    Clinically stable exam with well controlled BP on losartan 50 mg. Tolerating medications without side effects. Pt to  continue current regimen and low sodium diet.         Endocrine   Persistent proteinuria associated with type 2 diabetes mellitus   Type II diabetes mellitus with complication (Chronic)    Clinically stable without s/s of hypoglycemia.  Now on [employer] and has lost 25 lbs. Tolerating metformin and Jardiance well without side effects or other concerns. Lab Results  Component Value Date   HGBA1C 7.4 (A) 01/24/2022  A1C today is 7.9 which is surprising in view of her weight loss. Will obtain A1C at the lab to confirm.       Relevant Medications   glucose blood (ONE TOUCH ULTRA TEST) test strip   Other Relevant Orders   POCT glycosylated hemoglobin (Hb A1C) (Completed)   Hemoglobin A1c     Musculoskeletal and Integument   Bilateral primary osteoarthritis of knee    Doing well with weight loss - BMI now < 40        No follow-ups on file.   Partially dictated using George, any errors are not intentional.  Glean Hess, MD [place], [place]

## 2022-05-12 NOTE — Assessment & Plan Note (Signed)
Clinically stable exam with well controlled BP on losartan 50 mg. Tolerating medications without side effects. Pt to continue current regimen and low sodium diet.  

## 2022-05-13 LAB — HEMOGLOBIN A1C
Est. average glucose Bld gHb Est-mCnc: 154 mg/dL
Hgb A1c MFr Bld: 7 % — ABNORMAL HIGH (ref 4.8–5.6)

## 2022-05-26 ENCOUNTER — Ambulatory Visit: Payer: BC Managed Care – PPO | Admitting: Internal Medicine

## 2022-06-11 ENCOUNTER — Ambulatory Visit: Payer: BC Managed Care – PPO | Admitting: Internal Medicine

## 2022-06-11 ENCOUNTER — Emergency Department: Payer: BC Managed Care – PPO

## 2022-06-11 ENCOUNTER — Encounter: Payer: Self-pay | Admitting: Intensive Care

## 2022-06-11 ENCOUNTER — Other Ambulatory Visit: Payer: Self-pay

## 2022-06-11 ENCOUNTER — Inpatient Hospital Stay
Admission: EM | Admit: 2022-06-11 | Discharge: 2022-06-15 | DRG: 872 | Disposition: A | Discharge status: Home / Self Care | Payer: BC Managed Care – PPO | Attending: Internal Medicine | Admitting: Internal Medicine

## 2022-06-11 VITALS — BP 128/64 | HR 120 | Temp 99.5°F | Ht 60.0 in | Wt 193.0 lb

## 2022-06-11 DIAGNOSIS — Z91048 Other nonmedicinal substance allergy status: Secondary | ICD-10-CM | POA: Diagnosis not present

## 2022-06-11 DIAGNOSIS — Z833 Family history of diabetes mellitus: Secondary | ICD-10-CM | POA: Diagnosis not present

## 2022-06-11 DIAGNOSIS — R000 Tachycardia, unspecified: Secondary | ICD-10-CM

## 2022-06-11 DIAGNOSIS — M25422 Effusion, left elbow: Secondary | ICD-10-CM

## 2022-06-11 DIAGNOSIS — A419 Sepsis, unspecified organism: Secondary | ICD-10-CM | POA: Diagnosis not present

## 2022-06-11 DIAGNOSIS — E669 Obesity, unspecified: Secondary | ICD-10-CM | POA: Diagnosis present

## 2022-06-11 DIAGNOSIS — Z791 Long term (current) use of non-steroidal anti-inflammatories (NSAID): Secondary | ICD-10-CM

## 2022-06-11 DIAGNOSIS — L03114 Cellulitis of left upper limb: Secondary | ICD-10-CM | POA: Diagnosis present

## 2022-06-11 DIAGNOSIS — Z79899 Other long term (current) drug therapy: Secondary | ICD-10-CM | POA: Diagnosis not present

## 2022-06-11 DIAGNOSIS — M7022 Olecranon bursitis, left elbow: Secondary | ICD-10-CM | POA: Diagnosis present

## 2022-06-11 DIAGNOSIS — M71122 Other infective bursitis, left elbow: Secondary | ICD-10-CM

## 2022-06-11 DIAGNOSIS — E1169 Type 2 diabetes mellitus with other specified complication: Secondary | ICD-10-CM | POA: Diagnosis present

## 2022-06-11 DIAGNOSIS — Z7984 Long term (current) use of oral hypoglycemic drugs: Secondary | ICD-10-CM

## 2022-06-11 DIAGNOSIS — K219 Gastro-esophageal reflux disease without esophagitis: Secondary | ICD-10-CM | POA: Diagnosis present

## 2022-06-11 DIAGNOSIS — Z8249 Family history of ischemic heart disease and other diseases of the circulatory system: Secondary | ICD-10-CM

## 2022-06-11 DIAGNOSIS — Z6838 Body mass index (BMI) 38.0-38.9, adult: Secondary | ICD-10-CM

## 2022-06-11 DIAGNOSIS — N200 Calculus of kidney: Secondary | ICD-10-CM

## 2022-06-11 DIAGNOSIS — E119 Type 2 diabetes mellitus without complications: Secondary | ICD-10-CM | POA: Diagnosis present

## 2022-06-11 DIAGNOSIS — I10 Essential (primary) hypertension: Secondary | ICD-10-CM | POA: Diagnosis not present

## 2022-06-11 DIAGNOSIS — E785 Hyperlipidemia, unspecified: Secondary | ICD-10-CM | POA: Diagnosis present

## 2022-06-11 DIAGNOSIS — M549 Dorsalgia, unspecified: Secondary | ICD-10-CM

## 2022-06-11 DIAGNOSIS — E876 Hypokalemia: Secondary | ICD-10-CM | POA: Diagnosis present

## 2022-06-11 DIAGNOSIS — Z91040 Latex allergy status: Secondary | ICD-10-CM | POA: Diagnosis not present

## 2022-06-11 HISTORY — DX: Sepsis, unspecified organism: A41.9

## 2022-06-11 HISTORY — DX: Dorsalgia, unspecified: M54.9

## 2022-06-11 HISTORY — DX: Other infective bursitis, left elbow: M71.122

## 2022-06-11 LAB — URINALYSIS, ROUTINE W REFLEX MICROSCOPIC
Bacteria, UA: NONE SEEN
Bilirubin Urine: NEGATIVE
Glucose, UA: >=500 mg/dL — AB
Ketones, ur: 80 mg/dL — AB
Nitrite: NEGATIVE
Protein, ur: NEGATIVE mg/dL
Specific Gravity, Urine: 1.036 — ABNORMAL HIGH (ref 1.005–1.030)
pH: 5 (ref 5.0–8.0)

## 2022-06-11 LAB — COMPREHENSIVE METABOLIC PANEL
ALT: 26 U/L (ref 0–44)
AST: 25 U/L (ref 15–41)
Albumin: 4.3 g/dL (ref 3.5–5.0)
Alkaline Phosphatase: 115 U/L (ref 38–126)
Anion gap: 15 (ref 5–15)
BUN: 22 mg/dL (ref 8–23)
CO2: 19 mmol/L — ABNORMAL LOW (ref 22–32)
Calcium: 9.5 mg/dL (ref 8.9–10.3)
Chloride: 100 mmol/L (ref 98–111)
Creatinine, Ser: 0.63 mg/dL (ref 0.44–1.00)
GFR, Estimated: >60 mL/min (ref >60)
Glucose, Bld: 170 mg/dL — ABNORMAL HIGH (ref 70–99)
Potassium: 3.6 mmol/L (ref 3.5–5.1)
Sodium: 134 mmol/L — ABNORMAL LOW (ref 135–145)
Total Bilirubin: 0.9 mg/dL (ref 0.3–1.2)
Total Protein: 7.5 g/dL (ref 6.5–8.1)

## 2022-06-11 LAB — CBC WITH DIFFERENTIAL/PLATELET
Abs Immature Granulocytes: 0.11 10*3/uL — ABNORMAL HIGH (ref 0.00–0.07)
Basophils Absolute: 0.1 10*3/uL (ref 0.0–0.1)
Basophils Relative: 0 %
Eosinophils Absolute: 0 10*3/uL (ref 0.0–0.5)
Eosinophils Relative: 0 %
HCT: 42.8 % (ref 36.0–46.0)
Hemoglobin: 13.9 g/dL (ref 12.0–15.0)
Immature Granulocytes: 1 %
Lymphocytes Relative: 3 %
Lymphs Abs: 0.7 10*3/uL (ref 0.7–4.0)
MCH: 27 pg (ref 26.0–34.0)
MCHC: 32.5 g/dL (ref 30.0–36.0)
MCV: 83.3 fL (ref 80.0–100.0)
Monocytes Absolute: 1.4 10*3/uL — ABNORMAL HIGH (ref 0.1–1.0)
Monocytes Relative: 7 %
Neutro Abs: 19 10*3/uL — ABNORMAL HIGH (ref 1.7–7.7)
Neutrophils Relative %: 89 %
Platelets: 333 10*3/uL (ref 150–400)
RBC: 5.14 MIL/uL — ABNORMAL HIGH (ref 3.87–5.11)
RDW: 13.7 % (ref 11.5–15.5)
WBC: 21.2 10*3/uL — ABNORMAL HIGH (ref 4.0–10.5)
nRBC: 0 % (ref 0.0–0.2)

## 2022-06-11 LAB — APTT: aPTT: 30 seconds (ref 24–36)

## 2022-06-11 LAB — LACTIC ACID, PLASMA
Lactic Acid, Venous: 1.6 mmol/L (ref 0.5–1.9)
Lactic Acid, Venous: 1.5 mmol/L (ref 0.5–1.9)

## 2022-06-11 LAB — PROTIME-INR
INR: 1.2 (ref 0.8–1.2)
Prothrombin Time: 14.9 seconds (ref 11.4–15.2)

## 2022-06-11 LAB — PROCALCITONIN: Procalcitonin: 0.31 ng/mL

## 2022-06-11 LAB — GLUCOSE, CAPILLARY
Glucose-Capillary: 124 mg/dL — ABNORMAL HIGH (ref 70–99)
Glucose-Capillary: 160 mg/dL — ABNORMAL HIGH (ref 70–99)

## 2022-06-11 LAB — SEDIMENTATION RATE: Sed Rate: 2 mm/hr (ref 0–30)

## 2022-06-11 MED ORDER — VANCOMYCIN HCL 1750 MG/350ML IV SOLN
1750.0000 mg | Freq: Once | INTRAVENOUS | Status: AC
  Administered 2022-06-11: 1750 mg via INTRAVENOUS
  Filled 2022-06-11: qty 350

## 2022-06-11 MED ORDER — VITAMIN D 25 MCG (1000 UNIT) PO TABS
1000.0000 [IU] | ORAL_TABLET | Freq: Every day | ORAL | Status: DC
  Administered 2022-06-12 – 2022-06-14 (×3): 1000 [IU] via ORAL
  Filled 2022-06-11 (×3): qty 1

## 2022-06-11 MED ORDER — VANCOMYCIN HCL IN DEXTROSE 1-5 GM/200ML-% IV SOLN
1000.0000 mg | INTRAVENOUS | Status: DC
  Administered 2022-06-12 – 2022-06-13 (×2): 1000 mg via INTRAVENOUS
  Filled 2022-06-11 (×2): qty 200

## 2022-06-11 MED ORDER — METHOCARBAMOL 500 MG PO TABS
500.0000 mg | ORAL_TABLET | Freq: Three times a day (TID) | ORAL | Status: DC | PRN
  Administered 2022-06-11 – 2022-06-15 (×2): 500 mg via ORAL
  Filled 2022-06-11 (×2): qty 1

## 2022-06-11 MED ORDER — SODIUM CHLORIDE 0.9 % IV SOLN
2.0000 g | INTRAVENOUS | Status: DC
  Administered 2022-06-12 – 2022-06-14 (×3): 2 g via INTRAVENOUS
  Filled 2022-06-11 (×2): qty 2
  Filled 2022-06-11: qty 20
  Filled 2022-06-11: qty 2

## 2022-06-11 MED ORDER — GLUCOSAMINE HCL-MSM 375-250 MG PO TABS: ORAL_TABLET | Freq: Two times a day (BID) | ORAL | Status: DC

## 2022-06-11 MED ORDER — IOHEXOL 300 MG/ML  SOLN
100.0000 mL | Freq: Once | INTRAMUSCULAR | Status: AC | PRN
  Administered 2022-06-11: 100 mL via INTRAVENOUS

## 2022-06-11 MED ORDER — LACTATED RINGERS IV BOLUS (SEPSIS)
1000.0000 mL | Freq: Once | INTRAVENOUS | Status: AC
  Administered 2022-06-11: 1000 mL via INTRAVENOUS

## 2022-06-11 MED ORDER — MORPHINE SULFATE (PF) 4 MG/ML IV SOLN
4.0000 mg | Freq: Once | INTRAVENOUS | Status: AC
  Administered 2022-06-11: 4 mg via INTRAVENOUS
  Filled 2022-06-11: qty 1

## 2022-06-11 MED ORDER — LACTATED RINGERS IV BOLUS
1000.0000 mL | Freq: Once | INTRAVENOUS | Status: AC
  Administered 2022-06-11: 1000 mL via INTRAVENOUS

## 2022-06-11 MED ORDER — INSULIN ASPART 100 UNIT/ML IJ SOLN
0.0000 [IU] | Freq: Three times a day (TID) | INTRAMUSCULAR | Status: DC
  Administered 2022-06-11 – 2022-06-12 (×3): 1 [IU] via SUBCUTANEOUS
  Administered 2022-06-12: 3 [IU] via SUBCUTANEOUS
  Administered 2022-06-13 (×2): 2 [IU] via SUBCUTANEOUS
  Administered 2022-06-13 – 2022-06-14 (×2): 1 [IU] via SUBCUTANEOUS
  Administered 2022-06-14 – 2022-06-15 (×4): 2 [IU] via SUBCUTANEOUS
  Filled 2022-06-11 (×12): qty 1

## 2022-06-11 MED ORDER — INSULIN ASPART 100 UNIT/ML IJ SOLN
0.0000 [IU] | Freq: Every day | INTRAMUSCULAR | Status: DC
  Administered 2022-06-13: 2 [IU] via SUBCUTANEOUS
  Filled 2022-06-11: qty 1

## 2022-06-11 MED ORDER — ONDANSETRON HCL 4 MG/2ML IJ SOLN: 4.0000 mg | Freq: Three times a day (TID) | INTRAMUSCULAR | Status: DC | PRN

## 2022-06-11 MED ORDER — OXYCODONE-ACETAMINOPHEN 5-325 MG PO TABS
1.0000 | ORAL_TABLET | ORAL | Status: DC | PRN
  Administered 2022-06-11 – 2022-06-15 (×13): 1 via ORAL
  Filled 2022-06-11 (×13): qty 1

## 2022-06-11 MED ORDER — ATORVASTATIN CALCIUM 20 MG PO TABS
20.0000 mg | ORAL_TABLET | Freq: Every day | ORAL | Status: DC
  Administered 2022-06-11 – 2022-06-15 (×5): 20 mg via ORAL
  Filled 2022-06-11 (×5): qty 1

## 2022-06-11 MED ORDER — MORPHINE SULFATE (PF) 2 MG/ML IV SOLN: 2.0000 mg | INTRAVENOUS | Status: DC | PRN

## 2022-06-11 MED ORDER — SODIUM CHLORIDE 0.9 % IV SOLN
2.0000 g | Freq: Once | INTRAVENOUS | Status: AC
  Administered 2022-06-11: 2 g via INTRAVENOUS
  Filled 2022-06-11: qty 20

## 2022-06-11 MED ORDER — HYDRALAZINE HCL 20 MG/ML IJ SOLN: 5.0000 mg | INTRAMUSCULAR | Status: DC | PRN

## 2022-06-11 MED ORDER — ACETAMINOPHEN 325 MG PO TABS
650.0000 mg | ORAL_TABLET | Freq: Four times a day (QID) | ORAL | Status: DC | PRN
  Administered 2022-06-11 – 2022-06-12 (×3): 650 mg via ORAL
  Filled 2022-06-11 (×3): qty 2

## 2022-06-11 MED ORDER — ACETAMINOPHEN 325 MG PO TABS
650.0000 mg | ORAL_TABLET | Freq: Once | ORAL | Status: AC | PRN
  Administered 2022-06-11: 650 mg via ORAL
  Filled 2022-06-11: qty 2

## 2022-06-11 MED ORDER — ADULT MULTIVITAMIN W/MINERALS CH
1.0000 | ORAL_TABLET | Freq: Every day | ORAL | Status: DC
  Administered 2022-06-11 – 2022-06-15 (×5): 1 via ORAL
  Filled 2022-06-11 (×5): qty 1

## 2022-06-11 MED ORDER — LACTATED RINGERS IV SOLN: INTRAVENOUS | Status: AC

## 2022-06-11 NOTE — Consult Note (Signed)
ORTHOPAEDIC CONSULTATION  REQUESTING PHYSICIAN: Lorretta Harp, MD  Chief Complaint:   Left elbow/forearm swelling/bursitis   History of Present Illness: Brittany Forbes is a 62 y.o. female  female with past medical history of diabetes hypertension hyperlipidemia presented to the emergency room with left elbow pain.  She reports she thinks she scraped the posterior aspect of her left elbow about a week ago and it was doing okay until she woke up this morning with increased swelling and pain to the posterior elbow and proximal forearm.  She denies any drainage from the elbow or any bleeding.  She can move the elbow with a minimal pain but reports with full flexion she has significant pain dorsally.  She had some chills and tachycardia and was seen in her internal medicine doctor's office and sent to the emergency room for evaluation.  She denies any history of similar symptoms or injury.  She denies any numbness tingling, shortness of breath, chest pain, nausea, vomiting.  She denies any animal or insect bites that she knows of.  Past Medical History:  Diagnosis Date   Anemia    H/O   Diabetes (HCC)    Family history of adverse reaction to anesthesia    SISTER-PT UNSURE OF REACTION   GERD (gastroesophageal reflux disease)    Hematuria    History of kidney stones    HLD (hyperlipidemia)    HTN (hypertension)    Past Surgical History:  Procedure Laterality Date   COLONOSCOPY WITH PROPOFOL N/A 02/14/2021   Procedure: COLONOSCOPY WITH PROPOFOL;  Surgeon: Toney Reil, MD;  Location: [facility];  Service: Gastroenterology;  Laterality: N/A;   IR NEPHROSTOMY PLACEMENT LEFT  01/23/2017   KIDNEY STONE SURGERY Left 03/13/2005   NEPHROLITHOTOMY Left 01/23/2017   Procedure: NEPHROLITHOTOMY PERCUTANEOUS;  Surgeon: Riki Altes, MD;  Location: [facility] OR[facility];  Service: Urology;  Laterality: Left;   PARATHYROIDECTOMY  08/11/2011    [facility]   TONSILLECTOMY     Social History   Socioeconomic History   Marital status: Single    Spouse name: Not on file   Number of children: Not on file   Years of education: Not on file   Highest education level: Master's degree (e.g., MA, MS, MEng, MEd, MSW, MBA)  Occupational History   Occupation: Runner, broadcasting/film/video    Comment: ESL  Tobacco Use   Smoking status: Never   Smokeless tobacco: Never  Vaping Use   Vaping Use: Never used  Substance and Sexual Activity   Alcohol use: Yes    Comment: occ   Drug use: No   Sexual activity: Not on file  Other Topics Concern   Not on file  Social History Narrative   Not on file   Social Determinants of Health   Financial Resource Strain: Low Risk  (06/11/2022)   Overall Financial Resource Strain (CARDIA)    Difficulty of Paying Living Expenses: Not very hard  Food Insecurity: No Food Insecurity (06/11/2022)   Hunger Vital Sign    Worried About Running Out of Food in the Last Year: Never true    Ran Out of Food in the Last Year: Never true  Transportation Needs: No Transportation Needs (06/11/2022)   PRAPARE - Administrator, Civil Service (Medical): No    Lack of Transportation (Non-Medical): No  Physical Activity: Insufficiently Active (06/11/2022)   Exercise Vital Sign    Days of Exercise per Week: 1 day    Minutes of Exercise per Session: 30 min  Stress: No  Stress Concern Present (06/11/2022)   [employer] of Occupational Health - Occupational Stress Questionnaire    Feeling of Stress : Not at all  Social Connections: Moderately Integrated (06/11/2022)   Social Connection and Isolation Panel [NHANES]    Frequency of Communication with Friends and Family: Three times a week    Frequency of Social Gatherings with Friends and Family: Twice a week    Attends Religious Services: More than 4 times per year    Active Member of [employer] or Organizations: Yes    Attends Engineer, structural: More than 4 times per year    Marital  Status: Never married   Family History  Problem Relation Age of Onset   Cancer Mother        lung   Diabetes Father    Heart disease Father    Breast cancer Maternal Aunt 27   Kidney cancer Neg Hx    Bladder Cancer Neg Hx    Allergies  Allergen Reactions   Ace Inhibitors     Cough   Latex     Latex bandages cause a rash, paper tape is ok   Tape     Paper tape ok   Prior to Admission medications   Medication Sig Start Date End Date Taking? Authorizing Provider  atorvastatin (LIPITOR) 20 MG tablet TAKE 1 TABLET DAILY 03/21/22  Yes Reubin Milan, MD  Ca Carbonate-Mag Hydroxide (ROLAIDS PO) Take 1 tablet by mouth daily as needed (acid reflux).   Yes [provider]  Cholecalciferol 50 MCG (2000 UT) CAPS Vitamin D3 50 mcg (2,000 unit) capsule   Yes [provider]  empagliflozin (JARDIANCE) 25 MG TABS tablet Take 1 tablet (25 mg total) by mouth daily before breakfast. 02/16/22  Yes Reubin Milan, MD  GLUCOSAMINE HCL-MSM PO Take 1-2 tablets by mouth 2 (two) times daily. Take 2 tablets (1000mg ) in the morning and 1 tablet (500mg ) in the evening   Yes [provider]  losartan (COZAAR) 50 MG tablet Take 1 tablet (50 mg total) by mouth daily. 01/24/22  Yes Reubin Milan, MD  meloxicam (MOBIC) 7.5 MG tablet Take 7.5 mg by mouth as needed. 07/23/20  Yes [provider]  metFORMIN (GLUCOPHAGE) 500 MG tablet TAKE 1 TABLET 3 TIMES A DAY(NEW DOSE INCREASE) 04/10/22  Yes Reubin Milan, MD  MULTIPLE VITAMIN PO Take 1 tablet by mouth daily.   Yes [provider]  potassium citrate (UROCIT-K) 10 MEQ (1080 MG) SR tablet Take 1 tablet (10 mEq total) by mouth daily. Patient taking differently: Take 10 mEq by mouth 3 (three) times a week. 05/07/21  Yes Reubin Milan, MD  glucose blood (ONE TOUCH ULTRA TEST) test strip Use to test blood sugar daily 05/12/22   Reubin Milan, MD  OneTouch Delica Lancets 33G MISC Use 1 each 2 (two) times daily to  test blood sugar. Extra fine 09/24/21   Reubin Milan, MD   CT ELBOW LEFT W CONTRAST  Result Date: 06/11/2022 CLINICAL DATA:  Left elbow pain and swelling for a few days. EXAM: CT OF THE UPPER LEFT EXTREMITY WITH CONTRAST TECHNIQUE: Multidetector CT imaging of the upper left extremity was performed according to the standard protocol following intravenous contrast administration. RADIATION DOSE REDUCTION: This exam was performed according to the departmental dose-optimization program which includes automated exposure control, adjustment of the mA and/or kV according to patient size and/or use of iterative reconstruction technique. CONTRAST:  OMNIPAQUE IOHEXOL 300 MG/ML  SOLN COMPARISON:  Radiographs, same date. FINDINGS: Diffuse and marked subcutaneous soft tissue swelling/edema involving the entire dorsal aspect of the elbow and proximal forearm. More focal appearing fluid collection in the region of the olecranon bursa could suggest septic olecranon bursitis with surrounding cellulitis. No findings suspicious for septic arthritis or osteomyelitis. The surrounding elbow musculature is grossly normal. No obvious CT findings for myofasciitis or pyomyositis. IMPRESSION: 1. Suspect fluid collection in the region of the olecranon bursa suggesting septic olecranon bursitis with surrounding cellulitis. 2. No findings suspicious for septic arthritis or osteomyelitis. 3. No obvious CT findings for myofasciitis or pyomyositis. 4. If patient does not improve or worsens clinically, MRI without and with contrast may be helpful for further evaluation. Electronically Signed   By: Rudie Meyer M.D.   On: 06/11/2022 14:57   DG Elbow Complete Left  Result Date: 06/11/2022 CLINICAL DATA:  Left elbow pain.  No known injury. EXAM: LEFT ELBOW - COMPLETE 3+ VIEW COMPARISON:  None Available. FINDINGS: The joint spaces are maintained. No acute bony abnormality, osteochondral lesion, chondrocalcinosis or obvious joint  effusion. Extensive subcutaneous soft tissue swelling/edema or possible hematoma involving the dorsal aspect of the proximal forearm. IMPRESSION: 1. No acute bony findings or significant degenerative changes. 2. Extensive subcutaneous soft tissue swelling/edema or possible hematoma involving the dorsal aspect of the proximal forearm. Electronically Signed   By: Rudie Meyer M.D.   On: 06/11/2022 13:46   DG Chest 2 View  Result Date: 06/11/2022 CLINICAL DATA:  Upper back pain. EXAM: CHEST - 2 VIEW COMPARISON:  Chest radiographs 05/13/2011, CT abdomen and pelvis 01/01/2018 FINDINGS: Cardiac silhouette and mediastinal contours are within normal limits. The lungs are clear. No pleural effusion or pneumothorax. Mild dextrocurvature of the mid to lower thoracic spine. Mild-to-moderate multilevel degenerative disc changes. Mild height loss of the approximate T12 vertebral body is unchanged from prior radiograph and 01/01/2018 CT. IMPRESSION: No active cardiopulmonary disease. Electronically Signed   By: Neita Garnet M.D.   On: 06/11/2022 13:00    Positive ROS: All other systems have been reviewed and were otherwise negative with the exception of those mentioned in the HPI and as above.  Physical Exam: General:  Alert, no acute distress Psychiatric:  Patient is competent for consent with normal mood and affect   Cardiovascular:  No pedal edema Respiratory:  No wheezing, non-labored breathing GI:  Abdomen is soft and non-tender Skin:  No lesions in the area of chief complaint Neurologic:  Sensation intact distally Lymphatic:  No axillary or cervical lymphadenopathy  Orthopedic Exam:  Left upper extremity There is soft tissue swelling over the proximal forearm extending from the radial aspect around posteriorly to the ulnar aspect with minimal volar pain or swelling.  There is no erythema over this area of soft tissue swelling.  Over the tip of the olecranon there is a small area of erythema with a little  eschar at the center.  There are no palpable areas of fluctuance on exam.  The forearm examines like a nonpitting edema with no fluid waves. She has no pain with micromotion of the elbow No pain with tension or flexion of the wrist or motion of the fingers No pain with pronation and supination of the forearm. Ain/PIN/R/U/M/Ax distributions intact Compartments all soft and compressible throughout the entire arm + Radial pulse Fingers warm well-perfused  imaging:  CT scan and x-ray of the left elbow reports noted above images reviewed by myself.  There is soft tissue edema along the  dorsal aspect of the proximal forearm.  There are no discrete fluid collections noted and no fluid noted at the bursa on my evaluation of these images.  No joint effusion or any fractures or dislocations.  Generally agree with radiology interpretation  Assessment: Left elbow septic bursitis versus cellulitis  Plan: The patient presented with tachycardia, fever, and an elevated white count and was treated appropriately meeting sepsis treatment criteria.  Her exam is reassuring and there is no areas of palpable fluctuance around the elbow.  I have no concern for septic joint based on the clinical exam and the CT scan at this time.  Antibiotics were started in the emergency room.  The elbow was ultrasounded by the emergency room physician and no clearly visible fluid collections were noted along the areas of swelling which could be I&D need or aspirated which is consistent with the CT scan and my physical exam.  The lack of erythema over the swelling is atypical for a cellulitis or septic bursitis however given the full clinical picture stomach symptoms I feel treatment with antibiotics is reasonable.  At this time I have no plan for any orthopedic surgical intervention today and the patient may have a diet tonight.  In case a collection develops while the patient is treated with IV antibiotics I recommend the patient be  n.p.o. after midnight and I will evaluate her in the morning for possible I&D if I feel that there is any collection that we can help decompress.  Recommend continued IV antibiotics per medical team.  Orthopedics will continue to follow.  Thank you for this consult    Reinaldo Berber MD  Beeper #:  [phone]  06/11/2022 5:23 PM

## 2022-06-11 NOTE — ED Triage Notes (Signed)
Patients left elbow has redness and swelling. Appears to be an abscess

## 2022-06-11 NOTE — Sepsis Progress Note (Signed)
Code Sepsis protocol being monitored by e[employer]. 

## 2022-06-11 NOTE — Code Documentation (Signed)
CODE SEPSIS - PHARMACY COMMUNICATION  **Broad Spectrum Antibiotics should be administered within 1 hour of Sepsis diagnosis**  Time Code Sepsis Called/Page Received: 1312  Antibiotics Ordered: ceftriaxone, vancomycin  Time of 1st antibiotic administration: 1340  Additional action taken by pharmacy:   If necessary, Name of Provider/Nurse Contacted:     Sharen Hones ,PharmD Clinical Pharmacist  06/11/2022  1:17 PM

## 2022-06-11 NOTE — Consult Note (Addendum)
Pharmacy Antibiotic Note  Brittany Forbes is a 62 y.o. female admitted on 06/11/2022 with left elbow olecranon bursitis versus cellulitis. PMH significant for T2DM, HLD, HTN. She reports increased swelling and pain to the posterior elbow and proximal forearm after possible scrape injury. She denies any drainage or bleeding from the elbow. She denies any animal or insect bites. CT reveals soft tissue edema along the dorsal aspect of the proximal forearm. Patient has no reported allergies to antibiotics. Pharmacy has been consulted for vancomycin dosing.  Plan: Day 1 of antibiotics Completed vancomycin 1750 mg IV x1 in the ED. Start vancomycin 1000 mg IV Q24H. Goal AUC 400-550. Expected AUC: 481.2 Expected Css min: 11.4 SCr used: 0.8 (actual 0.63)  Weight used: IBW, Vd used: 0.5 (BMI 38.2) Patient is also on ceftriaxone 2 g IV Q24H Continue to monitor renal function and follow culture results   Height: 5' (152.4 cm) Weight: 88.9 kg (196 lb) IBW/kg (Calculated) : 45.5  Temp (24hrs), Avg:101.3 F (38.5 C), Min:99.5 F (37.5 C), Max:102.8 F (39.3 C)  Recent Labs  Lab 06/11/22 1234 06/11/22 1510  WBC 21.2*  --   CREATININE 0.63  --   LATICACIDVEN 1.6 1.5    Estimated Creatinine Clearance: 70.5 mL/min (by C-G formula based on SCr of 0.63 mg/dL).    Allergies  Allergen Reactions   Ace Inhibitors     Cough   Latex     Latex bandages cause a rash, paper tape is ok   Tape     Paper tape ok    Antimicrobials this admission: 5/1 Ceftriaxone >>  5/1 Vancomycin >>   Dose adjustments this admission: N/A  Microbiology results: 5/1 BCx: IP  Thank you for allowing pharmacy to be a part of this patient's care.  Celene Squibb, PharmD PGY1 Pharmacy Resident 06/11/2022 5:54 PM

## 2022-06-11 NOTE — H&P (Signed)
History and Physical    Brittany Forbes ZOX:[id number] DOB: 09-May-1960 DOA: 06/11/2022  Referring MD/NP/PA:   PCP: Reubin Milan, MD   Patient coming from:  The patient is coming from home.    Chief Complaint: pain left elbow and fever.  HPI: Brittany Forbes is a 62 y.o. female with medical history significant of HTN, HLD, DM, GERD, obesity, who present with pain in left elbow and fever.  Pt states that she scraped the left posterior elbow about week ago.  She has been doing fine until this morning when she woke up with left elbow pain. The pain is constant, sharp, severe, nonradiating.  The left elbow is erythematous and mildly swollen.  Patient has fever, no chills.  Denies chest pain, cough, shortness of breath.  She has nausea, no vomiting, diarrhea or abdominal pain.  No symptoms of UTI.  Patient reports upper back pain between shoulder blades, denies any injury to back.  She states that her upper back pain is mild currently.   Data reviewed independently and ED Course: pt was found to have WBC 21.2, lactic acid 1.6, 1.5, GFR> 60, temperature 102.8, heart rate 135, blood pressure 137/72, RR 32.  Chest x-ray negative.  Patient is admitted to telemetry bed as inpatient.  Dr. Audelia Acton of Ortho is consulted.  CT-left elbow: 1. Suspect fluid collection in the region of the olecranon bursa suggesting septic olecranon bursitis with surrounding cellulitis. 2. No findings suspicious for septic arthritis or osteomyelitis. 3. No obvious CT findings for myofasciitis or pyomyositis. 4. If patient does not improve or worsens clinically, MRI without and with contrast may be helpful for further evaluation.    EKG: I have personally reviewed.  Sinus rhythm, QTc 474, heart rate 129, RAD, right bundle blockade, early R wave progression   Review of Systems:   General: no fevers, chills, no body weight gain, has fatigue HEENT: no blurry vision, hearing changes or sore throat Respiratory: no  dyspnea, coughing, wheezing CV: no chest pain, no palpitations GI: has nausea, no vomiting, abdominal pain, diarrhea, constipation GU: no dysuria, burning on urination, increased urinary frequency, hematuria  Ext: no leg edema. Has left elbow pain and swelling Neuro: no unilateral weakness, numbness, or tingling, no vision change or hearing loss Skin: no rash, no skin tear. MSK: No muscle spasm, no deformity, no limitation of range of movement in spin. Has upper back pain Heme: No easy bruising.  Travel history: No recent long distant travel.   Allergy:  Allergies  Allergen Reactions   Ace Inhibitors     Cough   Latex     Latex bandages cause a rash, paper tape is ok   Tape     Paper tape ok    Past Medical History:  Diagnosis Date   Anemia    H/O   Diabetes (HCC)    Family history of adverse reaction to anesthesia    SISTER-PT UNSURE OF REACTION   GERD (gastroesophageal reflux disease)    Hematuria    History of kidney stones    HLD (hyperlipidemia)    HTN (hypertension)     Past Surgical History:  Procedure Laterality Date   COLONOSCOPY WITH PROPOFOL N/A 02/14/2021   Procedure: COLONOSCOPY WITH PROPOFOL;  Surgeon: Toney Reil, MD;  Location: [facility];  Service: Gastroenterology;  Laterality: N/A;   IR NEPHROSTOMY PLACEMENT LEFT  01/23/2017   KIDNEY STONE SURGERY Left 03/13/2005   NEPHROLITHOTOMY Left 01/23/2017   Procedure: NEPHROLITHOTOMY PERCUTANEOUS;  Surgeon: Irineo Axon  C, MD;  Location: [facility] ORS;  Service: Urology;  Laterality: Left;   PARATHYROIDECTOMY  08/11/2011   [facility]   TONSILLECTOMY      Social History:  reports that she has never smoked. She has never used smokeless tobacco. She reports current alcohol use. She reports that she does not use drugs.  Family History:  Family History  Problem Relation Age of Onset   Cancer Mother        lung   Diabetes Father    Heart disease Father    Breast cancer Maternal Aunt 16   Kidney cancer  Neg Hx    Bladder Cancer Neg Hx      Prior to Admission medications   Medication Sig Start Date End Date Taking? Authorizing Provider  atorvastatin (LIPITOR) 20 MG tablet TAKE 1 TABLET DAILY 03/21/22   Reubin Milan, MD  Ca Carbonate-Mag Hydroxide (ROLAIDS PO) Take 1 tablet by mouth daily as needed (acid reflux).    [provider]  Cholecalciferol 50 MCG (2000 UT) CAPS Vitamin D3 50 mcg (2,000 unit) capsule    [provider]  empagliflozin (JARDIANCE) 25 MG TABS tablet Take 1 tablet (25 mg total) by mouth daily before breakfast. 02/16/22   Reubin Milan, MD  GLUCOSAMINE HCL-MSM PO Take 1-2 tablets by mouth 2 (two) times daily. Take 2 tablets (1000mg ) in the morning and 1 tablet (500mg ) in the evening    [provider]  glucose blood (ONE TOUCH ULTRA TEST) test strip Use to test blood sugar daily 05/12/22   Reubin Milan, MD  losartan (COZAAR) 50 MG tablet Take 1 tablet (50 mg total) by mouth daily. 01/24/22   Reubin Milan, MD  meloxicam (MOBIC) 7.5 MG tablet Take 7.5 mg by mouth as needed. 07/23/20   [provider]  metFORMIN (GLUCOPHAGE) 500 MG tablet TAKE 1 TABLET 3 TIMES A DAY(NEW DOSE INCREASE) 04/10/22   Reubin Milan, MD  MULTIPLE VITAMIN PO Take 1 tablet by mouth daily.    [provider]  OneTouch Delica Lancets 33G MISC Use 1 each 2 (two) times daily to test blood sugar. Extra fine 09/24/21   Reubin Milan, MD  potassium citrate (UROCIT-K) 10 MEQ (1080 MG) SR tablet Take 1 tablet (10 mEq total) by mouth daily. Patient taking differently: Take 10 mEq by mouth 3 (three) times a week. 05/07/21   Reubin Milan, MD    Physical Exam: Vitals:   06/11/22 1630 06/11/22 1700 06/11/22 1713 06/11/22 1744  BP: 117/73 131/72  114/66  Pulse: (!) 112 (!) 112 (!) 105 (!) 110  Resp: 11 (!) 23 13 15   Temp:    (!) 100.8 F (38.2 C)  TempSrc:      SpO2: 97% 97% 96% 98%  Weight:      Height:       General: Not in acute  distress HEENT:       Eyes: PERRL, EOMI, no scleral icterus.       ENT: No discharge from the ears and nose, no pharynx injection, no tonsillar enlargement.        Neck: No JVD, no bruit, no mass felt. Heme: No neck lymph node enlargement. Cardiac: S1/S2, RRR, No murmurs, No gallops or rubs. Respiratory: No rales, wheezing, rhonchi or rubs. GI: Soft, nondistended, nontender, no rebound pain, no organomegaly, BS present. GU: No hematuria Ext: No pitting leg edema bilaterally. 1+DP/PT pulse bilaterally. Musculoskeletal: No joint deformities, No joint redness or warmth, no limitation  of ROM in spin. Skin: has pain, erythema, swelling in the posterior left elbow, with little pus drainage.    Neuro: Alert, oriented X3, cranial nerves II-XII grossly intact, moves all extremities normally. Psych: Patient is not psychotic, no suicidal or hemocidal ideation.  Labs on Admission: I have personally reviewed following labs and imaging studies  CBC: Recent Labs  Lab 06/11/22 1234  WBC 21.2*  NEUTROABS 19.0*  HGB 13.9  HCT 42.8  MCV 83.3  PLT 333   Basic Metabolic Panel: Recent Labs  Lab 06/11/22 1234  NA 134*  K 3.6  CL 100  CO2 19*  GLUCOSE 170*  BUN 22  CREATININE 0.63  CALCIUM 9.5   GFR: Estimated Creatinine Clearance: 70.5 mL/min (by C-G formula based on SCr of 0.63 mg/dL). Liver Function Tests: Recent Labs  Lab 06/11/22 1234  AST 25  ALT 26  ALKPHOS 115  BILITOT 0.9  PROT 7.5  ALBUMIN 4.3   No results for input(s): "LIPASE", "AMYLASE" in the last 168 hours. No results for input(s): "AMMONIA" in the last 168 hours. Coagulation Profile: No results for input(s): "INR", "PROTIME" in the last 168 hours. Cardiac Enzymes: No results for input(s): "CKTOTAL", "CKMB", "CKMBINDEX", "TROPONINI" in the last 168 hours. BNP (last 3 results) No results for input(s): "PROBNP" in the last 8760 hours. HbA1C: No results for input(s): "HGBA1C" in the last 72 hours. CBG: Recent  Labs  Lab 06/11/22 1756  GLUCAP 124*   Lipid Profile: No results for input(s): "CHOL", "HDL", "LDLCALC", "TRIG", "CHOLHDL", "LDLDIRECT" in the last 72 hours. Thyroid Function Tests: No results for input(s): "TSH", "T4TOTAL", "FREET4", "T3FREE", "THYROIDAB" in the last 72 hours. Anemia Panel: No results for input(s): "VITAMINB12", "FOLATE", "FERRITIN", "TIBC", "IRON", "RETICCTPCT" in the last 72 hours. Urine analysis:    Component Value Date/Time   COLORURINE STRAW (A) 06/11/2022 1223   APPEARANCEUR CLEAR (A) 06/11/2022 1223   APPEARANCEUR Cloudy (A) 01/30/2017 1444   LABSPEC 1.036 (H) 06/11/2022 1223   PHURINE 5.0 06/11/2022 1223   GLUCOSEU >=500 (A) 06/11/2022 1223   HGBUR SMALL (A) 06/11/2022 1223   BILIRUBINUR NEGATIVE 06/11/2022 1223   BILIRUBINUR neg 09/06/2020 0931   BILIRUBINUR Negative 01/30/2017 1444   KETONESUR 80 (A) 06/11/2022 1223   PROTEINUR NEGATIVE 06/11/2022 1223   UROBILINOGEN 0.2 09/06/2020 0931   NITRITE NEGATIVE 06/11/2022 1223   LEUKOCYTESUR TRACE (A) 06/11/2022 1223   Sepsis Labs: @LABRCNTIP (procalcitonin:4,lacticidven:4) )No results found for this or any previous visit (from the past 240 hour(s)).   Radiological Exams on Admission: CT ELBOW LEFT W CONTRAST  Result Date: 06/11/2022 CLINICAL DATA:  Left elbow pain and swelling for a few days. EXAM: CT OF THE UPPER LEFT EXTREMITY WITH CONTRAST TECHNIQUE: Multidetector CT imaging of the upper left extremity was performed according to the standard protocol following intravenous contrast administration. RADIATION DOSE REDUCTION: This exam was performed according to the departmental dose-optimization program which includes automated exposure control, adjustment of the mA and/or kV according to patient size and/or use of iterative reconstruction technique. CONTRAST:  OMNIPAQUE IOHEXOL 300 MG/ML  SOLN COMPARISON:  Radiographs, same date. FINDINGS: Diffuse and marked subcutaneous soft tissue swelling/edema  involving the entire dorsal aspect of the elbow and proximal forearm. More focal appearing fluid collection in the region of the olecranon bursa could suggest septic olecranon bursitis with surrounding cellulitis. No findings suspicious for septic arthritis or osteomyelitis. The surrounding elbow musculature is grossly normal. No obvious CT findings for myofasciitis or pyomyositis. IMPRESSION: 1. Suspect fluid collection in  the region of the olecranon bursa suggesting septic olecranon bursitis with surrounding cellulitis. 2. No findings suspicious for septic arthritis or osteomyelitis. 3. No obvious CT findings for myofasciitis or pyomyositis. 4. If patient does not improve or worsens clinically, MRI without and with contrast may be helpful for further evaluation. Electronically Signed   By: Rudie Meyer M.D.   On: 06/11/2022 14:57   DG Elbow Complete Left  Result Date: 06/11/2022 CLINICAL DATA:  Left elbow pain.  No known injury. EXAM: LEFT ELBOW - COMPLETE 3+ VIEW COMPARISON:  None Available. FINDINGS: The joint spaces are maintained. No acute bony abnormality, osteochondral lesion, chondrocalcinosis or obvious joint effusion. Extensive subcutaneous soft tissue swelling/edema or possible hematoma involving the dorsal aspect of the proximal forearm. IMPRESSION: 1. No acute bony findings or significant degenerative changes. 2. Extensive subcutaneous soft tissue swelling/edema or possible hematoma involving the dorsal aspect of the proximal forearm. Electronically Signed   By: Rudie Meyer M.D.   On: 06/11/2022 13:46   DG Chest 2 View  Result Date: 06/11/2022 CLINICAL DATA:  Upper back pain. EXAM: CHEST - 2 VIEW COMPARISON:  Chest radiographs 05/13/2011, CT abdomen and pelvis 01/01/2018 FINDINGS: Cardiac silhouette and mediastinal contours are within normal limits. The lungs are clear. No pleural effusion or pneumothorax. Mild dextrocurvature of the mid to lower thoracic spine. Mild-to-moderate multilevel  degenerative disc changes. Mild height loss of the approximate T12 vertebral body is unchanged from prior radiograph and 01/01/2018 CT. IMPRESSION: No active cardiopulmonary disease. Electronically Signed   By: Neita Garnet M.D.   On: 06/11/2022 13:00      Assessment/Plan Principal Problem:   Septic olecranon bursitis of left elbow Active Problems:   Cellulitis of left elbow   Sepsis (HCC)   HTN (hypertension)   HLD (hyperlipidemia)   Diabetes mellitus without complication (HCC)   Upper back pain   Obesity (BMI 30-39.9)   Assessment and Plan:   Sepsis due to possible septic olecranon bursitis of left elbow and cellulitis of left elbow: Patient meets criteria for sepsis with WBC 21.2, fever 102.8, heart rate up to 135, RR 32.  Lactic acid is normal 1.6 --> 1.5. consulted Dr. Audelia Acton of ortho.  - will admit to tele bed as inpatient - Empiric antimicrobial treatment with vancomycin and Rocephin - PRN Zofran for nausea, morphine and Percocet for pain - Blood cultures x 2  - ESR and CRP - will get Procalcitonin - IVF: 3.0 L of LR bolus in ED, followed by 75 cc/h  HTN (hypertension): -IV hydralazine as needed -Hold Cozaar since patient is at high risk of developing hypotension due to sepsis  HLD (hyperlipidemia) -Lipitor  Diabetes mellitus without complication (HCC): Recent A1c 7.0, not well-controlled.  Patient is taking metformin and Jardiance -Sliding scale insulin  Upper back pain: Etiology is not clear.  Patient denies any injury.  Since patient has sepsis, a potential differential diagnosis is discitis.  I discussed with the patient for doing MRI of T-spine, but since her pain is mild now, she would like to be observed now. -Pain control -As needed Robaxin -If upper back pain gets worse, may consider MRI of T-spine with and  Obesity (BMI 30-39.9): Body weight 88.9 kg, BMI 38.28 -Healthy diet and exercise -Encourage losing weight      DVT ppx: SQ Heparin    Code  Status: Full code  Family Communication: I offered to call her family, but patient states that she will call her friends by herself.  She said  I do need to call her family  Disposition Plan:  Anticipate discharge back to previous environment  Consults called:  Dr. Audelia Acton of Ortho is consulted.  Admission status and Level of care: Telemetry Medical:    as inpt       Dispo: The patient is from: Home              Anticipated d/c is to: Home              Anticipated d/c date is: 2 days              Patient currently is not medically stable to d/c.    Severity of Illness:  The appropriate patient status for this patient is INPATIENT. Inpatient status is judged to be reasonable and necessary in order to provide the required intensity of service to ensure the patient's safety. The patient's presenting symptoms, physical exam findings, and initial radiographic and laboratory data in the context of their chronic comorbidities is felt to place them at high risk for further clinical deterioration. Furthermore, it is not anticipated that the patient will be medically stable for discharge from the hospital within 2 midnights of admission.   * I certify that at the point of admission it is my clinical judgment that the patient will require inpatient hospital care spanning beyond 2 midnights from the point of admission due to high intensity of service, high risk for further deterioration and high frequency of surveillance required.*       Date of Service 06/11/2022    Lorretta Harp [facility]   If 7PM-7AM, please contact night-coverage [email or web address] 06/11/2022, 6:32 PM possible cellulitis of her left elbow

## 2022-06-11 NOTE — ED Provider Notes (Signed)
[facility] Provider Note    Event Date/Time   First MD Initiated Contact with Patient 06/11/22 1240     (approximate)   History   Abscess   HPI  Brittany Forbes is a 62 y.o. female with past medical history of diabetes hypertension hyperlipidemia who presents with elbow pain.  About a week ago patient scraped the left posterior elbow.  She was doing fine until this morning when she woke up with severe left elbow pain.  She still been able to range it but it is uncomfortable.  She also endorses some upper back pain which is not severe denies urinary symptoms denies cough congestion.  She was not aware of any fever.    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   Nephrolithiasis 01/23/2017   Microscopic hematuria 08/22/2016   Type II diabetes mellitus with complication (HCC) 06/06/2014   Essential (primary) hypertension 06/06/2014   Hyperlipidemia associated with type 2 diabetes mellitus (HCC) 06/06/2014   Persistent proteinuria associated with type 2 diabetes mellitus (HCC) 06/06/2014   Abnormal liver enzymes 09/12/2011   Status post parathyroidectomy 05/29/2011     Physical Exam  Triage Vital Signs: ED Triage Vitals  Enc Vitals Group     BP 06/11/22 1220 124/65     Pulse Rate 06/11/22 1220 (!) 135     Resp 06/11/22 1220 20     Temp 06/11/22 1220 (!) 102.8 F (39.3 C)     Temp Source 06/11/22 1220 Oral     SpO2 06/11/22 1220 95 %     Weight 06/11/22 1215  193 lb (87.5 kg)     Height 06/11/22 1215 5' (1.524 m)     Head Circumference --      Peak Flow --      Pain Score 06/11/22 1215 8     Pain Loc --      Pain Edu? --      Excl. in GC? --     Most recent vital signs: Vitals:   06/11/22 1500 06/11/22 1509  BP: 113/65   Pulse: (!) 107   Resp: 20   Temp:  (!) 101.5 F (38.6 C)  SpO2: 93%      General: Awake, no distress.  CV:  Good peripheral perfusion.  Resp:  Normal effort. Lung sounds are clear  Abd:  No distention.  soft nontender no CVA tenderness Neuro:             Awake, Alert, Oriented x 3  Other:  Left elbow has a small abrasion on the posterior aspect there is that is not centered over the olecranon it is warm mildly tender but not fluctuant there is minimal erythema around the small abrasion Patient is able to fully range the elbow without significant decreased range of motion no pain with micro movements   ED Results / Procedures / Treatments  Labs (all labs ordered are  listed, but only abnormal results are displayed) Labs Reviewed  COMPREHENSIVE METABOLIC PANEL - Abnormal; Notable for the following components:      Result Value   Sodium 134 (*)    CO2 19 (*)    Glucose, Bld 170 (*)    All other components within normal limits  CBC WITH DIFFERENTIAL/PLATELET - Abnormal; Notable for the following components:   WBC 21.2 (*)    RBC 5.14 (*)    Neutro Abs 19.0 (*)    Monocytes Absolute 1.4 (*)    Abs Immature Granulocytes 0.11 (*)    All other components within normal limits  CULTURE, BLOOD (ROUTINE X 2)  CULTURE, BLOOD (ROUTINE X 2)  LACTIC ACID, PLASMA  LACTIC ACID, PLASMA  URINALYSIS, ROUTINE W REFLEX MICROSCOPIC     EKG  EKG reviewed interpreted myself shows sinus tachycardia with bifascicular block no acute ischemic changes   RADIOLOGY I reviewed and interpreted the CXR which does not show any acute cardiopulmonary process    PROCEDURES:  Critical Care performed: No  .Critical  Care  Performed by: Georga Hacking, MD Authorized by: Georga Hacking, MD   Critical care provider statement:    Critical care time (minutes):  30   Critical care was time spent personally by me on the following activities:  Development of treatment plan with patient or surrogate, discussions with consultants, evaluation of patient's response to treatment, examination of patient, ordering and review of laboratory studies, ordering and review of radiographic studies, ordering and performing treatments and interventions, pulse oximetry, re-evaluation of patient's condition and review of old charts   The patient is on the cardiac monitor to evaluate for evidence of arrhythmia and/or significant heart rate changes.   MEDICATIONS ORDERED IN ED: Medications  lactated ringers infusion (0 mLs Intravenous Hold 06/11/22 1341)  vancomycin (VANCOREADY) IVPB 1750 mg/350 mL (1,750 mg Intravenous New Bag/Given 06/11/22 1508)  lactated ringers bolus 1,000 mL (has no administration in time range)  morphine (PF) 2 MG/ML injection 2 mg (has no administration in time range)  oxyCODONE-acetaminophen (PERCOCET/ROXICET) 5-325 MG per tablet 1 tablet (has no administration in time range)  acetaminophen (TYLENOL) tablet 650 mg (has no administration in time range)  ondansetron (ZOFRAN) injection 4 mg (has no administration in time range)  hydrALAZINE (APRESOLINE) injection 5 mg (has no administration in time range)  acetaminophen (TYLENOL) tablet 650 mg (650 mg Oral Given 06/11/22 1340)  lactated ringers bolus 1,000 mL (1,000 mLs Intravenous New Bag/Given 06/11/22 1340)  cefTRIAXone (ROCEPHIN) 2 g in sodium chloride 0.9 % 100 mL IVPB (0 g Intravenous Stopped 06/11/22 1410)  morphine (PF) 4 MG/ML injection 4 mg (4 mg Intravenous Given 06/11/22 1338)  iohexol (OMNIPAQUE) 300 MG/ML solution 100 mL (100 mLs Intravenous Contrast Given 06/11/22 1411)  lactated ringers bolus 1,000 mL (1,000 mLs Intravenous New Bag/Given 06/11/22  1511)     IMPRESSION / MDM / ASSESSMENT AND PLAN / ED COURSE  I reviewed the triage vital signs and the nursing notes.                              Patient's presentation is most consistent with acute presentation with potential threat to life or bodily function.  Differential diagnosis includes, but is not limited to, a septic bursitis, abscess, cellulitis, low suspicion for septic joint  The patient is a 62 year old female who presents with elbow pain.  She noticed a scrape of the skin of  the posterior elbow about a week ago but was doing fine until developed pain this morning.  On arrival to ED she is febrile to 1-2.8 tachycardic and tachypneic meeting sepsis criteria.  She has no cardiopulmonary symptoms does endorse some upper back pain no urinary symptoms.  On exam of the elbow she does have an area of skin breakdown over the olecranon as well as swelling of the proximal forearm she is able to fully range the elbow I have low suspicion for septic joint.  Clinically suspect a septic olecranon bursitis but the area of swelling is a little more distal than I am typically used to seen with this and will obtain a CT with contrast to evaluate for a discrete abscess of the proximal forearm.  Will give Vanco and Rocephin.  Labs are notable for leukocytosis of 21 negative lactate.  Did obtain a CT given somewhat atypical nature of the exam.  There is a fluid collection suggesting a likely septic olecranon bursitis with surrounding cellulitis.  I discussed with Dr. Valentina Shaggy with orthopedics who asked if I could possibly I&D.  Clinically the swelling is so diffuse and not fluctuant do not feel that there is a good spot for me to I&D.  I did perform an ultrasound to see if there is a fluid collection for aspiration but most of it is cobblestoning and again I am not confident that I will be able to get a good sample.  Patient will be admitted for IV antibiotics Dr. Valentina Shaggy and planning to see the patient later  today.     FINAL CLINICAL IMPRESSION(S) / ED DIAGNOSES   Final diagnoses:  Septic olecranon bursitis of left elbow     Rx / DC Orders   ED Discharge Orders     None        Note:  This document was prepared using Dragon voice recognition software and may include unintentional dictation errors.   Georga Hacking, MD 06/11/22 1535

## 2022-06-11 NOTE — Progress Notes (Signed)
Date:  06/11/2022   Name:  Brittany Forbes   DOB:  02-08-1961   MRN:  [id number]   Chief Complaint: Elbow Injury (Left Elbow. Scrapped elbow Friday and now its pink and she wants to be sure its not infected. Swollen, and its painful. Using tylenol, and ice. )  HPI 5 days ago noticed abrasion on left elbow.  It was not painful until a few days later it became swollen and tender.  No drainage or bleeding.  She has been taking tylenol regularly, feels weak and tired.  She has not taken her temperature.  Now feels like her heart is beating fast but no shortness of breath or chest pain.  She has some mid back ache only.  Lab Results  Component Value Date   NA 139 09/24/2021   K 4.5 09/24/2021   CO2 23 09/24/2021   GLUCOSE 139 (H) 09/24/2021   BUN 15 09/24/2021   CREATININE 0.73 09/24/2021   CALCIUM 10.2 09/24/2021   EGFR 92 09/24/2021   GFRNONAA 102 09/05/2019   Lab Results  Component Value Date   CHOL 171 09/24/2021   HDL 54 09/24/2021   LDLCALC 85 09/24/2021   TRIG 192 (H) 09/24/2021   CHOLHDL 3.2 09/24/2021   Lab Results  Component Value Date   TSH 0.749 09/24/2021   Lab Results  Component Value Date   HGBA1C 7.0 (H) 05/12/2022   Lab Results  Component Value Date   WBC 9.9 09/24/2021   HGB 14.1 09/24/2021   HCT 42.6 09/24/2021   MCV 85 09/24/2021   PLT 300 09/24/2021   Lab Results  Component Value Date   ALT 27 09/24/2021   AST 20 09/24/2021   ALKPHOS 130 (H) 09/24/2021   BILITOT 0.4 09/24/2021   No results found for: "25OHVITD2", "25OHVITD3", "VD25OH"   Review of Systems  Constitutional:  Positive for diaphoresis and fatigue. Negative for chills.  Respiratory:  Negative for chest tightness and shortness of breath.   Cardiovascular:  Negative for chest pain.  Musculoskeletal:  Positive for arthralgias, joint swelling and myalgias.  Psychiatric/Behavioral:  Negative for dysphoric mood and sleep disturbance. The patient is not nervous/anxious.     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   Tinnitus aurium, 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   Nephrolithiasis 01/23/2017   Microscopic hematuria 08/22/2016   Type II diabetes mellitus with complication (HCC) 06/06/2014   Essential (primary) hypertension 06/06/2014   Hyperlipidemia associated with type 2 diabetes mellitus (HCC) 06/06/2014   Persistent proteinuria associated with type 2 diabetes mellitus (HCC) 06/06/2014   Abnormal liver enzymes 09/12/2011   Status post parathyroidectomy 05/29/2011    Allergies  Allergen Reactions   Ace Inhibitors     Cough   Latex     Latex bandages cause a rash, paper tape is ok   Tape     Paper tape ok    Past Surgical History:  Procedure Laterality Date   COLONOSCOPY WITH PROPOFOL N/A 02/14/2021   Procedure: COLONOSCOPY WITH PROPOFOL;  Surgeon: Toney Reil, MD;  Location: [facility] ENDOSCOPY;  Service: Gastroenterology;  Laterality: N/A;   IR NEPHROSTOMY PLACEMENT LEFT  01/23/2017   KIDNEY STONE SURGERY Left 03/13/2005   NEPHROLITHOTOMY Left 01/23/2017   Procedure: NEPHROLITHOTOMY PERCUTANEOUS;  Surgeon: Riki Altes, MD;  Location: [facility] ORS;  Service: Urology;  Laterality: Left;   PARATHYROIDECTOMY  08/11/2011  [facility]   TONSILLECTOMY      Social History   Tobacco Use   Smoking status: Never   Smokeless tobacco: Never  Vaping Use   Vaping Use: Never used  Substance Use Topics   Alcohol use: No    Alcohol/week: 0.0 standard drinks of alcohol   Drug use: No     Medication list has been reviewed and updated.  Current Meds  Medication Sig   atorvastatin (LIPITOR) 20 MG tablet TAKE 1 TABLET DAILY   Ca Carbonate-Mag Hydroxide (ROLAIDS PO) Take 1 tablet by mouth daily as needed (acid reflux).   Cholecalciferol 50 MCG (2000 UT) CAPS Vitamin D3 50 mcg (2,000 unit) capsule   empagliflozin (JARDIANCE) 25 MG TABS tablet Take 1 tablet  (25 mg total) by mouth daily before breakfast.   GLUCOSAMINE HCL-MSM PO Take 1-2 tablets by mouth 2 (two) times daily. Take 2 tablets (1000mg ) in the morning and 1 tablet (500mg ) in the evening   glucose blood (ONE TOUCH ULTRA TEST) test strip Use to test blood sugar daily   losartan (COZAAR) 50 MG tablet Take 1 tablet (50 mg total) by mouth daily.   meloxicam (MOBIC) 7.5 MG tablet Take 7.5 mg by mouth as needed.   metFORMIN (GLUCOPHAGE) 500 MG tablet TAKE 1 TABLET 3 TIMES A DAY(NEW DOSE INCREASE)   MULTIPLE VITAMIN PO Take 1 tablet by mouth daily.   OneTouch Delica Lancets 33G MISC Use 1 each 2 (two) times daily to test blood sugar. Extra fine   potassium citrate (UROCIT-K) 10 MEQ (1080 MG) SR tablet Take 1 tablet (10 mEq total) by mouth daily. (Patient taking differently: Take 10 mEq by mouth 3 (three) times a week.)       06/11/2022   11:17 AM 05/12/2022   11:22 AM 01/24/2022    3:33 PM 09/24/2021    8:48 AM  GAD 7 : Generalized Anxiety Score  Nervous, Anxious, on Edge 0 0 0 1  Control/stop worrying 0 0 0 0  Worry too much - different things 0 0 1 1  Trouble relaxing 0 0 1 0  Restless 0 0 0 0  Easily annoyed or irritable 0 1 2 0  Afraid - awful might happen 0 0 0 0  Total GAD 7 Score 0 1 4 2   Anxiety Difficulty Not difficult at all Not difficult at all Somewhat difficult Not difficult at all       06/11/2022   11:17 AM 05/12/2022   11:22 AM 01/24/2022    3:33 PM  Depression screen PHQ 2/9  Decreased Interest 0 1 1  Down, Depressed, Hopeless 0 0 0  PHQ - 2 Score 0 1 1  Altered sleeping 0 1 1  Tired, decreased energy 0 1 1  Change in appetite 0 0 1  Feeling bad or failure about yourself  0 0 0  Trouble concentrating 0 1 1  Moving slowly or fidgety/restless 0 0 0  Suicidal thoughts 0 0 0  PHQ-9 Score 0 4 5  Difficult doing work/chores Not difficult at all Not difficult at all Somewhat difficult    BP Readings from Last 3 Encounters:  06/11/22 128/64  05/12/22 128/72   01/24/22 128/76    Physical Exam Vitals and nursing note reviewed.  Constitutional:      General: She is not in acute distress.    Appearance: She is well-developed. She is ill-appearing.  HENT:     Head: Normocephalic and atraumatic.  Cardiovascular:     Rate  and Rhythm: Regular rhythm. Tachycardia present.  Pulmonary:     Effort: Pulmonary effort is normal. No respiratory distress.     Breath sounds: No wheezing or rhonchi.  Musculoskeletal:     Left elbow: Swelling and deformity present. Decreased range of motion. Tenderness present.     Cervical back: Normal range of motion.  Skin:    General: Skin is warm and dry.     Findings: No rash.  Neurological:     Mental Status: She is alert and oriented to person, place, and time.  Psychiatric:        Mood and Affect: Mood normal.        Behavior: Behavior normal.     Wt Readings from Last 3 Encounters:  06/11/22 193 lb (87.5 kg)  05/12/22 195 lb (88.5 kg)  01/24/22 219 lb (99.3 kg)    BP 128/64   Pulse (!) 120   Temp 99.5 F (37.5 C) (Oral)   Ht 5' (1.524 m)   Wt 193 lb (87.5 kg)   SpO2 99%   BMI 37.69 kg/m   Assessment and Plan:  Problem List Items Addressed This Visit   None Visit Diagnoses     Tachycardia    -  Primary   sinus tach with partial BBB Recommend ED evaluation   Relevant Orders   EKG 12-Lead (Completed)   Elbow swelling, left       concern for infected bursa and possible sepsis in the setting of tachycardia and fever Pt will go directed to [facility] ED by private vehicle - offered EMS       No follow-ups on file.   Partially dictated using Dragon software, any errors are not intentional.  Reubin Milan, MD [facility] [place], [place]

## 2022-06-12 DIAGNOSIS — M71122 Other infective bursitis, left elbow: Secondary | ICD-10-CM

## 2022-06-12 DIAGNOSIS — E876 Hypokalemia: Secondary | ICD-10-CM | POA: Diagnosis not present

## 2022-06-12 LAB — C-REACTIVE PROTEIN: CRP: 8.8 mg/dL — ABNORMAL HIGH (ref <1.0)

## 2022-06-12 LAB — BASIC METABOLIC PANEL
Anion gap: 12 (ref 5–15)
BUN: 12 mg/dL (ref 8–23)
CO2: 21 mmol/L — ABNORMAL LOW (ref 22–32)
Calcium: 8.7 mg/dL — ABNORMAL LOW (ref 8.9–10.3)
Chloride: 102 mmol/L (ref 98–111)
Creatinine, Ser: 0.69 mg/dL (ref 0.44–1.00)
GFR, Estimated: >60 mL/min (ref >60)
Glucose, Bld: 121 mg/dL — ABNORMAL HIGH (ref 70–99)
Potassium: 2.8 mmol/L — ABNORMAL LOW (ref 3.5–5.1)
Sodium: 135 mmol/L (ref 135–145)

## 2022-06-12 LAB — HIV ANTIBODY (ROUTINE TESTING W REFLEX): HIV Screen 4th Generation wRfx: NONREACTIVE

## 2022-06-12 LAB — CBC
HCT: 36.5 % (ref 36.0–46.0)
Hemoglobin: 11.8 g/dL — ABNORMAL LOW (ref 12.0–15.0)
MCH: 27.3 pg (ref 26.0–34.0)
MCHC: 32.3 g/dL (ref 30.0–36.0)
MCV: 84.3 fL (ref 80.0–100.0)
Platelets: 235 10*3/uL (ref 150–400)
RBC: 4.33 MIL/uL (ref 3.87–5.11)
RDW: 14.3 % (ref 11.5–15.5)
WBC: 19.5 10*3/uL — ABNORMAL HIGH (ref 4.0–10.5)
nRBC: 0 % (ref 0.0–0.2)

## 2022-06-12 LAB — CULTURE, BLOOD (ROUTINE X 2): Culture: NO GROWTH

## 2022-06-12 LAB — GLUCOSE, CAPILLARY
Glucose-Capillary: 133 mg/dL — ABNORMAL HIGH (ref 70–99)
Glucose-Capillary: 201 mg/dL — ABNORMAL HIGH (ref 70–99)
Glucose-Capillary: 134 mg/dL — ABNORMAL HIGH (ref 70–99)
Glucose-Capillary: 172 mg/dL — ABNORMAL HIGH (ref 70–99)

## 2022-06-12 MED ORDER — POTASSIUM CHLORIDE CRYS ER 20 MEQ PO TBCR
40.0000 meq | EXTENDED_RELEASE_TABLET | ORAL | Status: AC
  Administered 2022-06-12 (×2): 40 meq via ORAL
  Filled 2022-06-12 (×2): qty 2

## 2022-06-12 NOTE — Assessment & Plan Note (Addendum)
5/2 -- Tmax 100.6 around 3:30 PM 5/3 -- U/S soft tissue with extensive soft tissue swelling, no sign of abscess or drainable fluid collections, but phlegmon changes on posterior elbow 2.6 x 1.1 x 1.5 cm 5/4 -- Tmax 100.3 overnight, WBC up 17.9 >> 19.9.  New developing blisters 5/5 -- normal temps, WBC improved 19.9 >> 12.9 which switch to linezolid yesterday --Treated with IV Rocephin, Vancomycin --5/4 Changed IV Vancomycin >> Linezolid after discussing case with on -call ID - agreed with Linezolid for anti-toxin properties --Discharge Abx: Omnicef x 5 days, Linezolid x 8 days ( to complete 10 day course) --Ortho consulted --[facility] follow up by mid-week --Follow blood cultures - neg to date --No fluid collections amenable to drainage on exam or imaging --Elevate LUE and mobilize extremity as tolerated --Pain control as needed (+stool softeners)

## 2022-06-12 NOTE — Assessment & Plan Note (Signed)
Pt states resolved.  Reported on admission. Per admitting hospitalist, she denies any injury.  In setting of sepsis, a potential differential diagnosis is discitis.  This was discussed with the patient, evaluation would include MRI of T-spine.  Pt elected to defer MRI and monitor as pain had improved to only mild.  --Pain control PRN --As needed Robaxin --If upper back pain gets worse, get MRI of T-spine

## 2022-06-12 NOTE — Progress Notes (Signed)
Subjective: Patient reports continuing pain over the posterior elbow. Has been NPO overnight. Still with fevers but fever curve improving from Tmax  of 102.8 in the ED yesterday afternoon and still some tachycardia. Denies any drainage from elbow. Denies SOB, CP, N, V.    Objective: Vital signs in last 24 hours: Temp:  [98.8 F (37.1 C)-102.8 F (39.3 C)] 100 F (37.8 C) (05/02 0406) Pulse Rate:  [104-135] 114 (05/02 0406) Resp:  [11-32] 20 (05/02 0406) BP: (90-136)/(47-73) 97/66 (05/02 0406) SpO2:  [91 %-99 %] 95 % (05/02 0406) Weight:  [21 kg-88.9 kg] 88.9 kg (05/01 1311)  Intake/Output from previous day: 05/01 0701 - 05/02 0700 In: 3226 [I.V.:720; IV Piggyback:2506] Out: -  Intake/Output this shift: No intake/output data recorded.  Recent Labs    06/11/22 1234 06/12/22 0453  HGB 13.9 11.8*   Recent Labs    06/11/22 1234 06/12/22 0453  WBC 21.2* 19.5*  RBC 5.14* 4.33  HCT 42.8 36.5  PLT 333 235   Recent Labs    06/11/22 1234 06/12/22 0453  NA 134* 135  K 3.6 2.8*  CL 100 102  CO2 19* 21*  BUN 22 12  CREATININE 0.63 0.69  GLUCOSE 170* 121*  CALCIUM 9.5 8.7*   Recent Labs    06/11/22 1817  INR 1.2    Orthopedic Exam:  Left upper extremity There is soft tissue swelling over the proximal forearm extending from the radial aspect around posteriorly to the ulnar aspect with minimal volar pain or swelling. The edge of the swelling has come down and is more diffuse along the forearm than prior with no areas of fluctuance. There is no erythema over this area of soft tissue swelling.  Over the tip of the olecranon there is a small eschar with some surrounding erythema which is tender to palpation but without any palpable fluid collection or fluctuance.  The forearm examines like a nonpitting edema with no fluid waves. She has no pain with micromotion of the elbow No pain with extension or flexion of the wrist or motion of the fingers No pain with pronation and  supination of the forearm. Ain/PIN/R/U/M/Ax distributions intact Compartments all soft and compressible throughout the entire arm + Radial pulse Fingers warm well-perfused   X-rays: CT scan and x-rays of the left elbow images reviewed by myself. There is soft tissue edema along the dorsal aspect of the proximal forearm. There are no discrete fluid collections noted and no fluid noted at the bursa on my evaluation of these images. No joint effusion or any fractures or dislocations. Generally agree with radiology interpretation   Assessment: Left elbow septic burisits versus cellulitis  Plan: The patient has a mildly improved fever curve and slightly improved white count today.  She still appears to be systemically ill with tachycardia and fevers continuing however.  There is still no palpable area of fluctuance which I feel would be amenable to incision and drainage over her left elbow and overall I feel that the exam is improved on the left elbow from yesterday.  I recommend continuing IV antibiotics with no plan for any surgical intervention today unless there is a change in status of the elbow physical exam.  She may have a diet today.  I do believe she will need additional treatment with IV antibiotics and if she is going to be staying over the night tonight and does not have significant improvement throughout the day today I would recommend n.p.o. after midnight tonight and I  will reevaluate her tomorrow morning for additional possible I&D if a fluid collection develops which would be amenable to incision and drainage for decompression purposes.  Recommend continued IV antibiotics per medical team orthopedics will continue to follow.  All of this was discussed with the patient agrees with the above plan.   Brittany Forbes 06/12/2022, 7:54 AM

## 2022-06-12 NOTE — Progress Notes (Signed)
Progress Note   [name] ZOX:[id number] DOB: [birth year] DOA: 06/11/2022     1 DOS: the patient was seen and examined on 06/12/2022   Brief hospital course: HPI on admission 06/11/22 by Dr. Clyde Lundborg: "Brittany Forbes is a 62 y.o. female with medical history significant of HTN, HLD, DM, GERD, obesity, who present with pain in left elbow and fever.   Pt states that she scraped the left posterior elbow about week ago.  She has been doing fine until this morning when she woke up with left elbow pain. The pain is constant, sharp, severe, nonradiating.  The left elbow is erythematous and mildly swollen.  Patient has fever, no chills.  Denies chest pain, cough, shortness of breath.  She has nausea, no vomiting, diarrhea or abdominal pain.  No symptoms of UTI.  Patient reports upper back pain between shoulder blades, denies any injury to back.  She states that her upper back pain is mild currently.   ED Course: pt was found to have WBC 21.2, lactic acid 1.6, 1.5, GFR> 60, temperature 102.8, heart rate 135, blood pressure 137/72, RR 32.  Chest x-ray negative.  Patient is admitted to telemetry bed as inpatient.  Dr. Audelia Acton of Ortho is consulted.   CT-left elbow: Suspected fluid collection in the region of the olecranon bursa suggesting septic olecranon bursitis with surrounding cellulitis.  No findings suspicious for septic arthritis or osteomyelitis.  No obvious CT findings for myofasciitis or pyomyositis.  "   Further hospital course and management as outlined below.   Assessment and Plan: * Septic olecranon bursitis of left elbow See Cellulitis of left elbow Follow up Ortho's recs  Cellulitis of left elbow Continue IV Vanc, [facility] following - see their recommendations Follow blood cultures Obtain I&D culture if able / amenable to drainage Monitor fever curve, CBC, hemodynamics  Sepsis (HCC) On admission, met criteria for sepsis with WBC 21.2, fever 102.8, heart rate up to 135,  RR 32.  Lactic acid is normal 1.6 --> 1.5  See Cellulitis of left elbow  HTN (hypertension) --IV hydralazine as needed --Hold Cozaar with soft BP's in setting of sepsis --Maintain MAP >65  HLD (hyperlipidemia) --Lipitor  Diabetes mellitus without complication (HCC) Recent A1c 7.0%.   --Hold home metformin and Jardiance --Sliding scale insulin  Upper back pain Pt states resolved.  Reported on admission. Per admitting hospitalist, she denies any injury.  In setting of sepsis, a potential differential diagnosis is discitis.  This was discussed with the patient, evaluation would include MRI of T-spine.  Pt elected to defer MRI and monitor as pain had improved to only mild.  --Pain control PRN --As needed Robaxin --If upper back pain gets worse, get MRI of T-spine   Obesity (BMI 30-39.9) Body mass index is 38.28 kg/m. Complicates overall care and prognosis.  Recommend lifestyle modifications including physical activity and diet for weight loss and overall long-term health.  Hypokalemia K 2.8 this AM Oral K-Cl replacement ordered Monitor BMP Check Mg level        Subjective: Pt awake sitting up in bed this AM.  Reports feeling okay, tired.  Had fever 100.7 around 1 AM.  States she felt very dizzy earlier but better now.  States her heart rate has always tended to run a little fast.  No other acute complaints.    Physical Exam: Vitals:   06/12/22 0200 06/12/22 0406 06/12/22 0801 06/12/22 1252  BP: (!) 90/55 97/66 (!) 105/51 119/65  Pulse: (!) 114 Marland Kitchen)  114 (!) 112 (!) 105  Resp: 19 20 17 18   Temp: (!) 100.5 F (38.1 C) 100 F (37.8 C) 100 F (37.8 C) 98.5 F (36.9 C)  TempSrc: Oral Oral    SpO2: 95% 95% 95% 98%  Weight:      Height:       General exam: awake, alert, no acute distress HEENT: atraumatic, clear conjunctiva, anicteric sclera, moist mucus membranes, hearing grossly normal  Respiratory system: CTAB, no wheezes, rales or rhonchi, normal respiratory  effort. Cardiovascular system: normal S1/S2,  RRR, no JVD, murmurs, rubs, gallops, no pedal edema.   Gastrointestinal system: soft, NT, ND, no HSM felt, +bowel sounds. Central nervous system: A&O x 4. no gross focal neurologic deficits, normal speech Extremities: left olecrannon scab noted with surrounding swelling with warmth and erythema Psychiatry: normal mood, congruent affect, judgement and insight appear normal   Data Reviewed:  Notable labs --   K 2.8, bicarb 21, Ca 8.7, WBC improved 21.2 >> 19.5. Hbg 11.8  Family Communication: None present  Disposition: Status is: Inpatient Remains inpatient appropriate because: Remains on IV antibiotics pending further improvement and clearance by orthopedic surgery   Planned Discharge Destination: Home    Time spent: 44 minutes  Author: Pennie Banter, DO 06/12/2022 2:44 PM  For on call review [email or web address].

## 2022-06-12 NOTE — Assessment & Plan Note (Signed)
See Cellulitis of left elbow Follow up Ortho's recs

## 2022-06-12 NOTE — Assessment & Plan Note (Signed)
Lipitor 

## 2022-06-12 NOTE — Plan of Care (Signed)
  Problem: Education: Goal: Ability to describe self-care measures that may prevent or decrease complications (Diabetes Survival Skills Education) will improve Outcome: Progressing   Problem: Coping: Goal: Ability to adjust to condition or change in health will improve Outcome: Progressing   Problem: Fluid Volume: Goal: Ability to maintain a balanced intake and output will improve Outcome: Progressing   Problem: Health Behavior/Discharge Planning: Goal: Ability to identify and utilize available resources and services will improve Outcome: Progressing   Problem: Metabolic: Goal: Ability to maintain appropriate glucose levels will improve Outcome: Progressing   Problem: Nutritional: Goal: Maintenance of adequate nutrition will improve Outcome: Progressing   Problem: Tissue Perfusion: Goal: Adequacy of tissue perfusion will improve Outcome: Progressing

## 2022-06-12 NOTE — Consult Note (Signed)
Pharmacy Antibiotic Note  Brittany Forbes is a 62 y.o. female admitted on 06/11/2022 with left elbow olecranon bursitis versus cellulitis. PMH significant for T2DM, HLD, HTN. She reports increased swelling and pain to the posterior elbow and proximal forearm after possible scrape injury. She denies any drainage or bleeding from the elbow. She denies any animal or insect bites. CT reveals soft tissue edema along the dorsal aspect of the proximal forearm. Patient has no reported allergies to antibiotics. Pharmacy has been consulted for vancomycin dosing.  Plan: Day 2 of antibiotics, Scr stable Completed vancomycin 1750 mg IV x1 in the ED. Start vancomycin 1000 mg IV Q24H. Goal AUC 400-550. Expected AUC: 481.2 Expected Css min: 11.4 SCr used: 0.8 (actual 0.63)  Weight used: IBW, Vd used: 0.5 (BMI 38.2) Patient is also on ceftriaxone 2 g IV Q24H Continue to monitor renal function and follow culture results  Height: 5' (152.4 cm) Weight: 88.9 kg (196 lb) IBW/kg (Calculated) : 45.5  Temp (24hrs), Avg:100.5 F (38.1 C), Min:98.8 F (37.1 C), Max:102.8 F (39.3 C)  Recent Labs  Lab 06/11/22 1234 06/11/22 1510 06/12/22 0453  WBC 21.2*  --  19.5*  CREATININE 0.63  --  0.69  LATICACIDVEN 1.6 1.5  --      Estimated Creatinine Clearance: 70.5 mL/min (by C-G formula based on SCr of 0.69 mg/dL).    Allergies  Allergen Reactions   Ace Inhibitors     Cough   Latex     Latex bandages cause a rash, paper tape is ok   Tape     Paper tape ok    Antimicrobials this admission: 5/1 Ceftriaxone >>  5/1 Vancomycin >>   Dose adjustments this admission:N/A  Microbiology results: 5/1 BCx: IP  Thank you for allowing pharmacy to be a part of this patient's care.  Shawnte Demarest Rodriguez-Guzman PharmD, BCPS 06/12/2022 8:45 AM

## 2022-06-12 NOTE — TOC Progression Note (Signed)
Transition of Care [facility]) - Progression Note    Patient Details  Name: Brittany Forbes MRN: [id number] Date of Birth: 1960-11-01  Transition of Care [facility] CM/SW Contact  Marlowe Sax, RN Phone Number: 06/12/2022, 3:24 PM  Clinical Narrative:     The patient is from home, she scraped her elbow a couple weeks ago, She is having IV ABX, Ortho to have her NPO tonoight and will re-evaluate her tomorrow for possible I&D No identified TOC needs, TOC signing off  Expected Discharge Plan: Home/Self Care Barriers to Discharge: Continued Medical Work up  Expected Discharge Plan and Services       Living arrangements for the past 2 months: Single Family Home                                       Social Determinants of Health (SDOH) Interventions SDOH Screenings   Food Insecurity: No Food Insecurity (06/11/2022)  Housing: Low Risk  (06/11/2022)  Transportation Needs: No Transportation Needs (06/11/2022)  Utilities: Not At Risk (01/24/2022)  Alcohol Screen: Low Risk  (06/11/2022)  Depression (PHQ2-9): Low Risk  (06/11/2022)  Financial Resource Strain: Low Risk  (06/11/2022)  Physical Activity: Insufficiently Active (06/11/2022)  Social Connections: Moderately Integrated (06/11/2022)  Stress: No Stress Concern Present (06/11/2022)  Tobacco Use: Low Risk  (06/11/2022)    Readmission Risk Interventions     No data to display

## 2022-06-12 NOTE — Assessment & Plan Note (Signed)
Recent A1c 7.0%.   --Hold home metformin and Jardiance --Sliding scale insulin

## 2022-06-12 NOTE — Assessment & Plan Note (Addendum)
On admission, met criteria for sepsis with WBC 21.2, fever 102.8, heart rate up to 135, RR 32.  Lactic acid is normal 1.6 --> 1.5  Sepsis physiology resolved. See Cellulitis of left elbow

## 2022-06-12 NOTE — Assessment & Plan Note (Signed)
Body mass index is 38.28 kg/m. Complicates overall care and prognosis.  Recommend lifestyle modifications including physical activity and diet for weight loss and overall long-term health.

## 2022-06-12 NOTE — Assessment & Plan Note (Addendum)
BP's were soft during admission, today normal with losartan on hold --hold losartan at d/c --monitor BP at home 1-2 times daily --resume losartan when BP's consistently > 130/80 --close PCP follow up

## 2022-06-12 NOTE — Hospital Course (Signed)
HPI on admission 06/11/22 by Dr. Clyde Lundborg: "Brittany Forbes is a 62 y.o. female with medical history significant of HTN, HLD, DM, GERD, obesity, who present with pain in left elbow and fever.   Pt states that she scraped the left posterior elbow about week ago.  She has been doing fine until this morning when she woke up with left elbow pain. The pain is constant, sharp, severe, nonradiating.  The left elbow is erythematous and mildly swollen.  Patient has fever, no chills.  Denies chest pain, cough, shortness of breath.  She has nausea, no vomiting, diarrhea or abdominal pain.  No symptoms of UTI.  Patient reports upper back pain between shoulder blades, denies any injury to back.  She states that her upper back pain is mild currently.   ED Course: pt was found to have WBC 21.2, lactic acid 1.6, 1.5, GFR> 60, temperature 102.8, heart rate 135, blood pressure 137/72, RR 32.  Chest x-ray negative.  Patient is admitted to telemetry bed as inpatient.  Dr. Audelia Acton of Ortho is consulted.   CT-left elbow: Suspected fluid collection in the region of the olecranon bursa suggesting septic olecranon bursitis with surrounding cellulitis.  No findings suspicious for septic arthritis or osteomyelitis.  No obvious CT findings for myofasciitis or pyomyositis.  "   Further hospital course and management as outlined below.

## 2022-06-12 NOTE — Assessment & Plan Note (Addendum)
K 2.8 >> 3.8 >> 3.2 this AM Oral K-Cl replacement ordered Monitor BMP Check Mg level

## 2022-06-13 ENCOUNTER — Inpatient Hospital Stay: Payer: BC Managed Care – PPO

## 2022-06-13 DIAGNOSIS — M71122 Other infective bursitis, left elbow: Secondary | ICD-10-CM | POA: Diagnosis not present

## 2022-06-13 LAB — BASIC METABOLIC PANEL
Anion gap: 7 (ref 5–15)
BUN: 15 mg/dL (ref 8–23)
CO2: 24 mmol/L (ref 22–32)
Calcium: 8.6 mg/dL — ABNORMAL LOW (ref 8.9–10.3)
Chloride: 103 mmol/L (ref 98–111)
Creatinine, Ser: 0.65 mg/dL (ref 0.44–1.00)
GFR, Estimated: >60 mL/min (ref >60)
Glucose, Bld: 140 mg/dL — ABNORMAL HIGH (ref 70–99)
Potassium: 3.8 mmol/L (ref 3.5–5.1)
Sodium: 134 mmol/L — ABNORMAL LOW (ref 135–145)

## 2022-06-13 LAB — CBC
HCT: 36.3 % (ref 36.0–46.0)
Hemoglobin: 11.6 g/dL — ABNORMAL LOW (ref 12.0–15.0)
MCH: 27.2 pg (ref 26.0–34.0)
MCHC: 32 g/dL (ref 30.0–36.0)
MCV: 85 fL (ref 80.0–100.0)
Platelets: 221 10*3/uL (ref 150–400)
RBC: 4.27 MIL/uL (ref 3.87–5.11)
RDW: 14.5 % (ref 11.5–15.5)
WBC: 17.9 10*3/uL — ABNORMAL HIGH (ref 4.0–10.5)
nRBC: 0 % (ref 0.0–0.2)

## 2022-06-13 LAB — CULTURE, BLOOD (ROUTINE X 2): Special Requests: ADEQUATE

## 2022-06-13 LAB — GLUCOSE, CAPILLARY
Glucose-Capillary: 132 mg/dL — ABNORMAL HIGH (ref 70–99)
Glucose-Capillary: 190 mg/dL — ABNORMAL HIGH (ref 70–99)
Glucose-Capillary: 167 mg/dL — ABNORMAL HIGH (ref 70–99)
Glucose-Capillary: 201 mg/dL — ABNORMAL HIGH (ref 70–99)

## 2022-06-13 LAB — MAGNESIUM: Magnesium: 1.9 mg/dL (ref 1.7–2.4)

## 2022-06-13 NOTE — Plan of Care (Signed)

## 2022-06-13 NOTE — Progress Notes (Signed)
Subjective: Patient with improving symptoms, no fevers last 12 hours. Patient reports feeling better. Was NPO overnight. Denies any drainage from elbow or wounds. Denies SOB, CP, N, V.    Objective: Vital signs in last 24 hours: Temp:  [98.5 F (36.9 C)-100.6 F (38.1 C)] 98.6 F (37 C) (05/03 0728) Pulse Rate:  [100-120] 100 (05/03 0728) Resp:  [16-18] 18 (05/03 0728) BP: (97-125)/(60-95) 97/60 (05/03 0728) SpO2:  [95 %-98 %] 95 % (05/03 0728)  Intake/Output from previous day: 05/02 0701 - 05/03 0700 In: 223.5 [IV Piggyback:223.5] Out: -  Intake/Output this shift: No intake/output data recorded.  Recent Labs    06/11/22 1234 06/12/22 0453 06/13/22 0257  HGB 13.9 11.8* 11.6*   Recent Labs    06/12/22 0453 06/13/22 0257  WBC 19.5* 17.9*  RBC 4.33 4.27  HCT 36.5 36.3  PLT 235 221   Recent Labs    06/12/22 0453 06/13/22 0257  NA 135 134*  K 2.8* 3.8  CL 102 103  CO2 21* 24  BUN 12 15  CREATININE 0.69 0.65  GLUCOSE 121* 140*  CALCIUM 8.7* 8.6*   Recent Labs    06/11/22 1817  INR 1.2   Orthopedic Exam:  Left upper extremity There is soft tissue swelling over the proximal forearm extending from the radial aspect around posteriorly to the ulnar aspect with minimal volar pain or swelling. The edge of the swelling has come down and is more diffuse along the forearm than prior with no areas of fluctuance. There is no erythema over this area of soft tissue swelling.  Over the tip of the olecranon there is a small eschar with some surrounding erythema which is tender to palpation improved from exam yesterday.  The forearm examines like a nonpitting edema with no fluid waves. She has no pain with micromotion of the elbow No pain with extension or flexion of the wrist or motion of the fingers No pain with pronation and supination of the forearm. Ain/PIN/R/U/M/Ax distributions intact Compartments all soft and compressible throughout the entire arm + Radial  pulse Fingers warm well-perfused   X-rays: CT scan and x-rays of the left elbow images reviewed by myself. There is soft tissue edema along the dorsal aspect of the proximal forearm. There are no discrete fluid collections noted and no fluid noted at the bursa on my evaluation of these images. No joint effusion or any fractures or dislocations. Generally agree with radiology interpretation    Assessment: Left elbow septic burisits versus cellulitis   Plan: The patient has a improved fever curve and improved white count today.  She appears to be improving systemically with regards to symptoms as well. Elbow bursa looks even better today with no areas amenable to I&D. Will obtain US of the forearm/elbow to rule out any deep abscesses that are not palpable. Given lack of focal fluctuance on exam if a small deep pocket is found I would lead towards an IR aspiration should any exist. Given this I have no plan for any surgical intervention at this time and the patient may have a Diet. Will review results of [place]. Recommend continue abx per medicine. All discussed with patient who agrees with above plan. No orthopedic interventions planned.      Reinaldo Berber 06/13/2022, 8:16 AM

## 2022-06-13 NOTE — Progress Notes (Signed)
Progress Note   [name] ZOX:[id number] DOB: 1961/01/09 DOA: 06/11/2022     2 DOS: the patient was seen and examined on 06/13/2022   Brief hospital course: HPI on admission 06/11/22 by Dr. Clyde Lundborg: "Brittany Forbes is a 62 y.o. female with medical history significant of HTN, HLD, DM, GERD, obesity, who present with pain in left elbow and fever.   Pt states that she scraped the left posterior elbow about week ago.  She has been doing fine until this morning when she woke up with left elbow pain. The pain is constant, sharp, severe, nonradiating.  The left elbow is erythematous and mildly swollen.  Patient has fever, no chills.  Denies chest pain, cough, shortness of breath.  She has nausea, no vomiting, diarrhea or abdominal pain.  No symptoms of UTI.  Patient reports upper back pain between shoulder blades, denies any injury to back.  She states that her upper back pain is mild currently.   ED Course: pt was found to have WBC 21.2, lactic acid 1.6, 1.5, GFR> 60, temperature 102.8, heart rate 135, blood pressure 137/72, RR 32.  Chest x-ray negative.  Patient is admitted to telemetry bed as inpatient.  Dr. Audelia Acton of Ortho is consulted.   CT-left elbow: Suspected fluid collection in the region of the olecranon bursa suggesting septic olecranon bursitis with surrounding cellulitis.  No findings suspicious for septic arthritis or osteomyelitis.  No obvious CT findings for myofasciitis or pyomyositis.  "   Further hospital course and management as outlined below.   Assessment and Plan: * Septic olecranon bursitis of left elbow See Cellulitis of left elbow Follow up Ortho's recs  Cellulitis of left elbow 5/2 -- Tmax 100.6 around 3:30 PM 5/3 -- U/S soft tissue with extensive soft tissue swelling, no sign of abscess or drainable fluid collections, but phlegmon changes on posterior elbow 2.6 x 1.1 x 1.5 cm --Continue IV Vanc, Rocphin --Ortho following - see their recommendations --Follow  blood cultures --Obtain I&D culture if any drainable collection develops --Monitor fever curve, CBC, hemodynamics  Sepsis (HCC) On admission, met criteria for sepsis with WBC 21.2, fever 102.8, heart rate up to 135, RR 32.  Lactic acid is normal 1.6 --> 1.5  See Cellulitis of left elbow  HTN (hypertension) --IV hydralazine as needed --Hold Cozaar with soft BP's in setting of sepsis --Maintain MAP >65  HLD (hyperlipidemia) --Lipitor  Diabetes mellitus without complication (HCC) Recent A1c 7.0%.   --Hold home metformin and Jardiance --Sliding scale insulin  Upper back pain Pt states resolved.  Reported on admission. Per admitting hospitalist, she denies any injury.  In setting of sepsis, a potential differential diagnosis is discitis.  This was discussed with the patient, evaluation would include MRI of T-spine.  Pt elected to defer MRI and monitor as pain had improved to only mild.  --Pain control PRN --As needed Robaxin --If upper back pain gets worse, get MRI of T-spine   Obesity (BMI 30-39.9) Body mass index is 38.28 kg/m. Complicates overall care and prognosis.  Recommend lifestyle modifications including physical activity and diet for weight loss and overall long-term health.  Hypokalemia K 2.8 this AM Oral K-Cl replacement ordered Monitor BMP Check Mg level        Subjective: Pt up in recliner when seen this AM.  Reports persistent swelling of the left arm, now extends all the way down to hand.  Redness improving.  Felt feverish yesterday afternoon when temp was up.  No F/C overnight or  this AM so far.  Agreeable to continue IV antibiotics longer if needed, awaiting ultrasound.   Physical Exam: Vitals:   06/13/22 0341 06/13/22 0728 06/13/22 0823 06/13/22 1107  BP: 106/64 97/60 (!) 104/58 (!) 102/47  Pulse: (!) 108 100 96 (!) 102  Resp: 16 18  18   Temp: 99.5 F (37.5 C) 98.6 F (37 C)  98.3 F (36.8 C)  TempSrc:      SpO2: 96% 95%  96%  Weight:       Height:       General exam: awake, alert, no acute distress HEENT: moist mucus membranes, hearing grossly normal  Respiratory system: on room air, normal respiratory effort. Cardiovascular system: RRR, no pedal edema.   Gastrointestinal system: soft, NT, ND Central nervous system: A&O x 4. no gross focal neurologic deficits, normal speech Extremities: left upper extremity diffuse swelling extends to the hand, left olecrannon scab noted with improved warmth and erythema, some remaining tenderness on palpation Psychiatry: normal mood, congruent affect, judgement and insight appear normal   Data Reviewed:  Notable labs --   Na 134, glucose 140, Ca 8.6, WBC improving 17.9k, Hbg stable 11.6  Family Communication: None present  Disposition: Status is: Inpatient Remains inpatient appropriate because: Remains on IV antibiotics pending further improvement and clearance by orthopedic surgery   Planned Discharge Destination: Home    Time spent: 46 minutes  Author: Pennie Banter, DO 06/13/2022 2:44 PM  For on call review [email or web address].

## 2022-06-13 NOTE — Consult Note (Signed)
Pharmacy Antibiotic Note  Brittany Forbes is a 62 y.o. female admitted on 06/11/2022 with left elbow olecranon bursitis versus cellulitis. PMH significant for T2DM, HLD, HTN. She reports increased swelling and pain to the posterior elbow and proximal forearm after possible scrape injury. She denies any drainage or bleeding from the elbow. She denies any animal or insect bites. CT reveals soft tissue edema along the dorsal aspect of the proximal forearm. Patient has no reported allergies to antibiotics. Pharmacy has been consulted for vancomycin dosing.  Plan: Day 3 of antibiotics, Scr stable Continue vancomycin 1000 mg IV Q24H. Goal AUC 400-550. Expected AUC: 481.2 Expected Css min: 11.4 SCr used: 0.8 (actual 0.63)  Weight used: IBW, Vd used: 0.5 (BMI 38.2) Patient is also on ceftriaxone 2 g IV Q24H Continue to monitor renal function and follow culture results  Height: 5' (152.4 cm) Weight: 88.9 kg (196 lb) IBW/kg (Calculated) : 45.5  Temp (24hrs), Avg:99 F (37.2 C), Min:98.3 F (36.8 C), Max:100.6 F (38.1 C)  Recent Labs  Lab 06/11/22 1234 06/11/22 1510 06/12/22 0453 06/13/22 0257  WBC 21.2*  --  19.5* 17.9*  CREATININE 0.63  --  0.69 0.65  LATICACIDVEN 1.6 1.5  --   --      Estimated Creatinine Clearance: 70.5 mL/min (by C-G formula based on SCr of 0.65 mg/dL).    Allergies  Allergen Reactions   Ace Inhibitors     Cough   Latex     Latex bandages cause a rash, paper tape is ok   Tape     Paper tape ok    Antimicrobials this admission: 5/1 Ceftriaxone >>  5/1 Vancomycin >>   Dose adjustments this admission:N/A  Microbiology results: 5/1 BCx: NGTD  Thank you for allowing pharmacy to be a part of this patient's care.  Bari Mantis PharmD Clinical Pharmacist 06/13/2022

## 2022-06-13 NOTE — Progress Notes (Signed)
Ultrasound of left elbow reviewed no sign of an abscess or organized fluid collection which I feel would be amenable to I&D or drainage. Area of potential phlegmon does not appear like an organized abscess or area which would improve with I&D. Consistent with cellulitis of the extremity, agree with radiologist interpretation.  Given these findings and clinical improvement, no plan for surgical intervention from orthopedic perspective. Continue Abx per medical team. Please re-consult if symptoms or exam changes. Orthopedically stable at this time.\  Brittany Forbes

## 2022-06-13 NOTE — TOC Progression Note (Signed)
Transition of Care [facility]) - Progression Note    Patient Details  Name: Brittany Forbes MRN: [id number] Date of Birth: 1960/05/15  [facility] of [facility] CM/SW Contact  Marlowe Sax, RN Phone Number: 06/13/2022, 11:33 AM  Clinical Narrative:     The patient was NPO but is now able to have a diet and [place] was ordered for the elbow, TOC has no identified needs and will sign off, if needs arise please consult TOC  Expected Discharge Plan: Home/Self Care Barriers to Discharge: Continued Medical Work up  Expected Discharge Plan and Services       Living arrangements for the past 2 months: Single Family Home                                       Social Determinants of Health (SDOH) Interventions SDOH Screenings   Food Insecurity: No Food Insecurity (06/11/2022)  Housing: Low Risk  (06/11/2022)  Transportation Needs: No Transportation Needs (06/11/2022)  Utilities: Not At Risk (01/24/2022)  Alcohol Screen: Low Risk  (06/11/2022)  Depression (PHQ2-9): Low Risk  (06/11/2022)  Financial Resource Strain: Low Risk  (06/11/2022)  Physical Activity: Insufficiently Active (06/11/2022)  Social Connections: Moderately Integrated (06/11/2022)  Stress: No Stress Concern Present (06/11/2022)  Tobacco Use: Low Risk  (06/11/2022)    Readmission Risk Interventions     No data to display

## 2022-06-14 DIAGNOSIS — M71122 Other infective bursitis, left elbow: Secondary | ICD-10-CM | POA: Diagnosis not present

## 2022-06-14 LAB — CBC
HCT: 35.6 % — ABNORMAL LOW (ref 36.0–46.0)
Hemoglobin: 11.4 g/dL — ABNORMAL LOW (ref 12.0–15.0)
MCH: 27.1 pg (ref 26.0–34.0)
MCHC: 32 g/dL (ref 30.0–36.0)
MCV: 84.6 fL (ref 80.0–100.0)
Platelets: 232 10*3/uL (ref 150–400)
RBC: 4.21 MIL/uL (ref 3.87–5.11)
RDW: 14.4 % (ref 11.5–15.5)
WBC: 19.9 10*3/uL — ABNORMAL HIGH (ref 4.0–10.5)
nRBC: 0 % (ref 0.0–0.2)

## 2022-06-14 LAB — GLUCOSE, CAPILLARY
Glucose-Capillary: 135 mg/dL — ABNORMAL HIGH (ref 70–99)
Glucose-Capillary: 167 mg/dL — ABNORMAL HIGH (ref 70–99)
Glucose-Capillary: 165 mg/dL — ABNORMAL HIGH (ref 70–99)
Glucose-Capillary: 183 mg/dL — ABNORMAL HIGH (ref 70–99)

## 2022-06-14 LAB — BASIC METABOLIC PANEL
Anion gap: 11 (ref 5–15)
BUN: 12 mg/dL (ref 8–23)
CO2: 20 mmol/L — ABNORMAL LOW (ref 22–32)
Calcium: 8.6 mg/dL — ABNORMAL LOW (ref 8.9–10.3)
Chloride: 101 mmol/L (ref 98–111)
Creatinine, Ser: 0.53 mg/dL (ref 0.44–1.00)
GFR, Estimated: >60 mL/min (ref >60)
Glucose, Bld: 165 mg/dL — ABNORMAL HIGH (ref 70–99)
Potassium: 3.2 mmol/L — ABNORMAL LOW (ref 3.5–5.1)
Sodium: 132 mmol/L — ABNORMAL LOW (ref 135–145)

## 2022-06-14 LAB — C-REACTIVE PROTEIN: CRP: 31 mg/dL — ABNORMAL HIGH (ref <1.0)

## 2022-06-14 LAB — CULTURE, BLOOD (ROUTINE X 2)

## 2022-06-14 MED ORDER — POTASSIUM CHLORIDE CRYS ER 20 MEQ PO TBCR
40.0000 meq | EXTENDED_RELEASE_TABLET | Freq: Once | ORAL | Status: AC
  Administered 2022-06-14: 40 meq via ORAL
  Filled 2022-06-14: qty 2

## 2022-06-14 MED ORDER — LINEZOLID 600 MG/300ML IV SOLN
600.0000 mg | Freq: Two times a day (BID) | INTRAVENOUS | Status: DC
  Administered 2022-06-14 – 2022-06-15 (×2): 600 mg via INTRAVENOUS
  Filled 2022-06-14 (×2): qty 300

## 2022-06-14 MED ORDER — ENOXAPARIN SODIUM 40 MG/0.4ML IJ SOSY
40.0000 mg | PREFILLED_SYRINGE | Freq: Every day | INTRAMUSCULAR | Status: DC
  Administered 2022-06-14: 40 mg via SUBCUTANEOUS
  Filled 2022-06-14: qty 0.4

## 2022-06-14 NOTE — Progress Notes (Signed)
Progress Note   [name] ZOX:[id number] DOB: 1960-11-05 DOA: 06/11/2022     3 DOS: the patient was seen and examined on 06/14/2022   Brief hospital course: HPI on admission 06/11/22 by Dr. Clyde Lundborg: "Brittany Forbes is a 62 y.o. female with medical history significant of HTN, HLD, DM, GERD, obesity, who present with pain in left elbow and fever.   Pt states that she scraped the left posterior elbow about week ago.  She has been doing fine until this morning when she woke up with left elbow pain. The pain is constant, sharp, severe, nonradiating.  The left elbow is erythematous and mildly swollen.  Patient has fever, no chills.  Denies chest pain, cough, shortness of breath.  She has nausea, no vomiting, diarrhea or abdominal pain.  No symptoms of UTI.  Patient reports upper back pain between shoulder blades, denies any injury to back.  She states that her upper back pain is mild currently.   ED Course: pt was found to have WBC 21.2, lactic acid 1.6, 1.5, GFR> 60, temperature 102.8, heart rate 135, blood pressure 137/72, RR 32.  Chest x-ray negative.  Patient is admitted to telemetry bed as inpatient.  Dr. Audelia Acton of Ortho is consulted.   CT-left elbow: Suspected fluid collection in the region of the olecranon bursa suggesting septic olecranon bursitis with surrounding cellulitis.  No findings suspicious for septic arthritis or osteomyelitis.  No obvious CT findings for myofasciitis or pyomyositis.  "   Further hospital course and management as outlined below.   Assessment and Plan: * Septic olecranon bursitis of left elbow See Cellulitis of left elbow Follow up Ortho's recs  Cellulitis of left elbow 5/2 -- Tmax 100.6 around 3:30 PM 5/3 -- U/S soft tissue with extensive soft tissue swelling, no sign of abscess or drainable fluid collections, but phlegmon changes on posterior elbow 2.6 x 1.1 x 1.5 cm 5/4 -- Tmax 100.3 overnight, WBC up 17.9 >> 19.9.  New developing  blisters --Continue IV Rocephin --Change IV Vancomycin >> Linezolid --Discussed case with on -call ID - agreed with Linezolid for anti-toxin properties, and continue with IV for now, await further improvement before transition to PO abx --Ortho following - see their recommendations --Follow blood cultures --No fluid collections amenable to drainage --Repeat CT or U/S if physical exam warrants --Monitor fever curve, CBC, hemodynamics  Sepsis (HCC) On admission, met criteria for sepsis with WBC 21.2, fever 102.8, heart rate up to 135, RR 32.  Lactic acid is normal 1.6 --> 1.5  See Cellulitis of left elbow  HTN (hypertension) --IV hydralazine as needed --Hold Cozaar with soft BP's in setting of sepsis --Maintain MAP >65  HLD (hyperlipidemia) --Lipitor  Diabetes mellitus without complication (HCC) Recent A1c 7.0%.   --Hold home metformin and Jardiance --Sliding scale insulin  Upper back pain Pt states resolved.  Reported on admission. Per admitting hospitalist, she denies any injury.  In setting of sepsis, a potential differential diagnosis is discitis.  This was discussed with the patient, evaluation would include MRI of T-spine.  Pt elected to defer MRI and monitor as pain had improved to only mild.  --Pain control PRN --As needed Robaxin --If upper back pain gets worse, get MRI of T-spine   Obesity (BMI 30-39.9) Body mass index is 38.28 kg/m. Complicates overall care and prognosis.  Recommend lifestyle modifications including physical activity and diet for weight loss and overall long-term health.  Hypokalemia K 2.8 >> 3.8 >> 3.2 this AM Oral K-Cl replacement  ordered Monitor BMP Check Mg level        Subjective: Pt up in recliner when seen this AM.  She is keeping left arm elevated.  Low grade fever overnight, none this AM.  Overall feeling okay, but concerned about persistent severe arm swelling.  Agreeable to continue IV antibiotics given WBC increased again today  and still having low grade temps.  No other acute complaints.   Physical Exam: Vitals:   06/13/22 1107 06/13/22 1613 06/13/22 2316 06/14/22 0754  BP: (!) 102/47 (!) 124/50 (!) 96/55 (!) 121/55  Pulse: (!) 102 (!) 110 100 (!) 101  Resp: 18 18 20 18   Temp: 98.3 F (36.8 C) 98.2 F (36.8 C) 100.3 F (37.9 C) 98.4 F (36.9 C)  TempSrc:      SpO2: 96% 98% 95% 98%  Weight:      Height:       General exam: awake, alert, no acute distress HEENT: moist mucus membranes, hearing grossly normal  Respiratory system: on room air, normal respiratory effort. Cardiovascular system: RRR, no pedal edema.   Gastrointestinal system: soft, NT, ND Central nervous system: A&O x 4. no gross focal neurologic deficits, normal speech Extremities: left upper extremity with persistent diffuse swelling extends to the hand, left olecrannon scab noted with stable warmth and mildly improving erythema, new developing patch of early blisters on both dorsal and ventral aspect of left forearm Psychiatry: normal mood, congruent affect, judgement and insight appear normal   Data Reviewed:  Notable labs --   Na 132, K 3.2, glucose 165, Ca 8.6, WBC 17.9 >> 19.9k, Hbg stable 11.4   U/S Soft Tissue Left Upper Extremity 5/3 --- no evidence of abscess or drain-able fluid collection. IMPRESSION: "Extensive subcutaneous soft tissue swelling along the elbow and forearm, as can be seen in cellulitis.  Hypoechoic soft tissue thickening along the posterior elbow, area measuring 2.6 x 1.1 x 1.5 cm, compatible with non-drainable phlegmon. No evidence of an organized soft tissue abscess."   Family Communication: None present  Disposition: Status is: Inpatient Remains inpatient appropriate because: Remains on IV antibiotics pending further improvement and clearance by orthopedic surgery   Planned Discharge Destination: Home    Time spent: 46 minutes  Author: Pennie Banter, DO 06/14/2022 2:38 PM  For on call  review [email or web address].

## 2022-06-15 ENCOUNTER — Encounter: Payer: Self-pay | Admitting: Internal Medicine

## 2022-06-15 DIAGNOSIS — M71122 Other infective bursitis, left elbow: Secondary | ICD-10-CM | POA: Diagnosis not present

## 2022-06-15 LAB — CULTURE, BLOOD (ROUTINE X 2): Special Requests: ADEQUATE

## 2022-06-15 LAB — CBC
HCT: 33.4 % — ABNORMAL LOW (ref 36.0–46.0)
Hemoglobin: 10.8 g/dL — ABNORMAL LOW (ref 12.0–15.0)
MCH: 27.1 pg (ref 26.0–34.0)
MCHC: 32.3 g/dL (ref 30.0–36.0)
MCV: 83.7 fL (ref 80.0–100.0)
Platelets: 250 10*3/uL (ref 150–400)
RBC: 3.99 MIL/uL (ref 3.87–5.11)
RDW: 14.5 % (ref 11.5–15.5)
WBC: 12.1 10*3/uL — ABNORMAL HIGH (ref 4.0–10.5)
nRBC: 0 % (ref 0.0–0.2)

## 2022-06-15 LAB — BASIC METABOLIC PANEL
Anion gap: 7 (ref 5–15)
BUN: 9 mg/dL (ref 8–23)
CO2: 24 mmol/L (ref 22–32)
Calcium: 8.4 mg/dL — ABNORMAL LOW (ref 8.9–10.3)
Chloride: 102 mmol/L (ref 98–111)
Creatinine, Ser: 0.52 mg/dL (ref 0.44–1.00)
GFR, Estimated: >60 mL/min (ref >60)
Glucose, Bld: 171 mg/dL — ABNORMAL HIGH (ref 70–99)
Potassium: 3.4 mmol/L — ABNORMAL LOW (ref 3.5–5.1)
Sodium: 133 mmol/L — ABNORMAL LOW (ref 135–145)

## 2022-06-15 LAB — GLUCOSE, CAPILLARY
Glucose-Capillary: 166 mg/dL — ABNORMAL HIGH (ref 70–99)
Glucose-Capillary: 181 mg/dL — ABNORMAL HIGH (ref 70–99)

## 2022-06-15 MED ORDER — OXYCODONE-ACETAMINOPHEN 5-325 MG PO TABS: 1.0000 | ORAL_TABLET | ORAL | 0 refills | Status: DC | PRN

## 2022-06-15 MED ORDER — LINEZOLID 600 MG PO TABS: 600.0000 mg | ORAL_TABLET | Freq: Two times a day (BID) | ORAL | 0 refills | Status: AC

## 2022-06-15 MED ORDER — CEFDINIR 300 MG PO CAPS: 300.0000 mg | ORAL_CAPSULE | Freq: Two times a day (BID) | ORAL | 0 refills | Status: AC

## 2022-06-15 MED ORDER — ACETAMINOPHEN 325 MG PO TABS: 650.0000 mg | ORAL_TABLET | Freq: Four times a day (QID) | ORAL | Status: DC | PRN

## 2022-06-15 MED ORDER — POTASSIUM CITRATE ER 10 MEQ (1080 MG) PO TBCR
20.0000 meq | EXTENDED_RELEASE_TABLET | Freq: Every day | ORAL | 0 refills | Status: DC
Start: 2022-06-15 — End: 2023-05-06

## 2022-06-15 MED ORDER — POTASSIUM CHLORIDE CRYS ER 20 MEQ PO TBCR
40.0000 meq | EXTENDED_RELEASE_TABLET | Freq: Once | ORAL | Status: AC
  Administered 2022-06-15: 40 meq via ORAL
  Filled 2022-06-15: qty 2

## 2022-06-15 MED ORDER — METHOCARBAMOL 500 MG PO TABS: 500.0000 mg | ORAL_TABLET | Freq: Three times a day (TID) | ORAL | 0 refills | Status: DC | PRN

## 2022-06-15 NOTE — Progress Notes (Signed)
Reviewed discharge instructions with pt. Pt verbalized understanding. Pt discharged with all personal belongings. Staff wheeled pt out. Pt transported to home via family/friend vehicle.  

## 2022-06-15 NOTE — Progress Notes (Signed)
   Subjective: 62 year old female with left elbow cellulitis.  She has been afebrile.  Pain and swelling improving.  White count trending down.  Objective: Vital signs in last 24 hours: Temp:  [98 F (36.7 C)-98.1 F (36.7 C)] 98 F (36.7 C) (05/05 0809) Pulse Rate:  [95-96] 96 (05/05 0809) Resp:  [17-20] 17 (05/05 0809) BP: (112-121)/(61-70) 121/61 (05/05 0809) SpO2:  [97 %] 97 % (05/05 0809)  Intake/Output from previous day: 05/04 0701 - 05/05 0700 In: 504.6 [P.O.:300; IV Piggyback:204.6] Out: -  Intake/Output this shift: No intake/output data recorded.  Recent Labs    06/13/22 0257 06/14/22 0511 06/15/22 0455  HGB 11.6* 11.4* 10.8*   Recent Labs    06/14/22 0511 06/15/22 0455  WBC 19.9* 12.1*  RBC 4.21 3.99  HCT 35.6* 33.4*  PLT 232 250   Recent Labs    06/14/22 0511 06/15/22 0455  NA 132* 133*  K 3.2* 3.4*  CL 101 102  CO2 20* 24  BUN 12 9  CREATININE 0.53 0.52  GLUCOSE 165* 171*  CALCIUM 8.6* 8.4*   No results for input(s): "LABPT", "INR" in the last 72 hours.  EXAM General - Patient is Alert, Appropriate, and Oriented Extremity - Neurovascular intact Sensation intact distally Full composite fist.  Normal range of motion of the elbow. Left elbow/forearm with mild swelling.  Tissues are soft.  No signs of compartment syndrome.  Overall erythema/swelling/edema has shown continued improvement over the last 2 days.  No areas of drainage or fluctuance.  Past Medical History:  Diagnosis Date   Anemia    H/O   Diabetes (HCC)    Family history of adverse reaction to anesthesia    SISTER-PT UNSURE OF REACTION   GERD (gastroesophageal reflux disease)    Hematuria    History of kidney stones    HLD (hyperlipidemia)    HTN (hypertension)     Assessment/Plan:      Principal Problem:   Septic olecranon bursitis of left elbow Active Problems:   HTN (hypertension)   HLD (hyperlipidemia)   Diabetes mellitus without complication (HCC)   Obesity  (BMI 30-39.9)   Sepsis (HCC)   Upper back pain   Cellulitis of left elbow   Hypokalemia  Estimated body mass index is 38.28 kg/m as calculated from the following:   Height as of this encounter: 5' (1.524 m).   Weight as of this encounter: 29.89 kg. 62 year old female with left elbow/forearm cellulitis.  She has responded well to IV antibiotics and has seen daily improvement in overall swelling/edema/erythema.  No evidence of organized fluid collection on exam or imaging studies.  Patient has been afebrile with significantly improved white count.  Internal medicine considering discharging patient home today with oral antibiotics, Zyvox.  Patient will need close outpatient follow-up in clinic the middle of next week.   Lollie Marrow, PA-C [facility] 06/15/2022, 9:45 AM

## 2022-06-15 NOTE — Plan of Care (Signed)

## 2022-06-15 NOTE — Discharge Summary (Signed)
Physician Discharge Summary   [name] MRN: [id number] DOB: 03-02-1960  Admit date:     06/11/2022  Discharge date: 06/15/22  Discharge Physician: Pennie Banter   PCP: Reubin Milan, MD   Recommendations at discharge:    Follow up at [facility] by middle of this week Follow up with Primary Care in 1-2 weeks Monitor BP and resume losartan when indicated.  Losartan is held due to softer BP's. Repeat CBC, BMP, Mg in 1-2 weeks  Discharge Diagnoses: Principal Problem:   Septic olecranon bursitis of left elbow Active Problems:   Cellulitis of left elbow   HTN (hypertension)   HLD (hyperlipidemia)   Diabetes mellitus without complication (HCC)   Obesity (BMI 30-39.9)   Hypokalemia  Resolved Problems:   Sepsis (HCC)   Upper back pain  Hospital Course: HPI on admission 06/11/22 by Dr. Clyde Lundborg: "Brittany Forbes is a 62 y.o. female with medical history significant of HTN, HLD, DM, GERD, obesity, who present with pain in left elbow and fever.   Pt states that she scraped the left posterior elbow about week ago.  She has been doing fine until this morning when she woke up with left elbow pain. The pain is constant, sharp, severe, nonradiating.  The left elbow is erythematous and mildly swollen.  Patient has fever, no chills.  Denies chest pain, cough, shortness of breath.  She has nausea, no vomiting, diarrhea or abdominal pain.  No symptoms of UTI.  Patient reports upper back pain between shoulder blades, denies any injury to back.  She states that her upper back pain is mild currently.   ED Course: pt was found to have WBC 21.2, lactic acid 1.6, 1.5, GFR> 60, temperature 102.8, heart rate 135, blood pressure 137/72, RR 32.  Chest x-ray negative.  Patient is admitted to telemetry bed as inpatient.  Dr. Audelia Acton of Ortho is consulted.   CT-left elbow: Suspected fluid collection in the region of the olecranon bursa suggesting septic olecranon bursitis with  surrounding cellulitis.  No findings suspicious for septic arthritis or osteomyelitis.  No obvious CT findings for myofasciitis or pyomyositis.  "   Further hospital course and management as outlined below.   Assessment and Plan: * Septic olecranon bursitis of left elbow See Cellulitis of left elbow Follow up Ortho's recs  Cellulitis of left elbow 5/2 -- Tmax 100.6 around 3:30 PM 5/3 -- U/S soft tissue with extensive soft tissue swelling, no sign of abscess or drainable fluid collections, but phlegmon changes on posterior elbow 2.6 x 1.1 x 1.5 cm 5/4 -- Tmax 100.3 overnight, WBC up 17.9 >> 19.9.  New developing blisters 5/5 -- normal temps, WBC improved 19.9 >> 12.9 which switch to linezolid yesterday --Treated with IV Rocephin, Vancomycin --5/4 Changed IV Vancomycin >> Linezolid after discussing case with on -call ID - agreed with Linezolid for anti-toxin properties --Discharge Abx: Omnicef x 5 days, Linezolid x 8 days ( to complete 10 day course) --Ortho consulted --[facility] follow up by mid-week --Follow blood cultures - neg to date --No fluid collections amenable to drainage on exam or imaging --Elevate LUE and mobilize extremity as tolerated --Pain control as needed (+stool softeners)  Sepsis (HCC)-resolved as of 06/15/2022 On admission, met criteria for sepsis with WBC 21.2, fever 102.8, heart rate up to 135, RR 32.  Lactic acid is normal 1.6 --> 1.5  Sepsis physiology resolved. See Cellulitis of left elbow  HTN (hypertension) BP's were soft during admission, today normal with  losartan on hold --hold losartan at d/c --monitor BP at home 1-2 times daily --resume losartan when BP's consistently > 130/80 --close PCP follow up  HLD (hyperlipidemia) --Lipitor  Diabetes mellitus without complication (HCC) Recent A1c 7.0%.   --Hold home metformin and Jardiance --Sliding scale insulin  Upper back pain-resolved as of 06/15/2022 Pt states resolved.  Reported on admission. Per  admitting hospitalist, she denies any injury.  In setting of sepsis, a potential differential diagnosis is discitis.  This was discussed with the patient, evaluation would include MRI of T-spine.  Pt elected to defer MRI and monitor as pain had improved to only mild.  --Pain control PRN --As needed Robaxin --If upper back pain gets worse, get MRI of T-spine   Obesity (BMI 30-39.9) Body mass index is 38.28 kg/m. Complicates overall care and prognosis.  Recommend lifestyle modifications including physical activity and diet for weight loss and overall long-term health.  Hypokalemia K 2.8 >> 3.8 >> 3.2 this AM Oral K-Cl replacement ordered Monitor BMP Check Mg level   Septic arthritis -- ruled out       Consultants: Orthopedic surgery Procedures performed: None  Disposition: Home Diet recommendation:  Discharge Diet Orders (From admission, onward)     Start     Ordered   06/15/22 0000  Diet - low sodium heart healthy        06/15/22 1016           Cardiac diet DISCHARGE MEDICATION: Allergies as of 06/15/2022       Reactions   Ace Inhibitors    Cough   Latex    Latex bandages cause a rash, paper tape is ok   Tape    Paper tape ok        Medication List     STOP taking these medications    losartan 50 MG tablet Commonly known as: COZAAR       TAKE these medications    acetaminophen 325 MG tablet Commonly known as: TYLENOL Take 2 tablets (650 mg total) by mouth every 6 (six) hours as needed for mild pain or fever.   atorvastatin 20 MG tablet Commonly known as: LIPITOR TAKE 1 TABLET DAILY   cefdinir 300 MG capsule Commonly known as: OMNICEF Take 1 capsule (300 mg total) by mouth 2 (two) times daily for 5 days.   Cholecalciferol 50 MCG (2000 UT) Caps Vitamin D3 50 mcg (2,000 unit) capsule   empagliflozin 25 MG Tabs tablet Commonly known as: Jardiance Take 1 tablet (25 mg total) by mouth daily before breakfast.   GLUCOSAMINE HCL-MSM PO Take  1-2 tablets by mouth 2 (two) times daily. Take 2 tablets (1000mg ) in the morning and 1 tablet (500mg ) in the evening   glucose blood test strip Commonly known as: ONE TOUCH ULTRA TEST Use to test blood sugar daily   linezolid 600 MG tablet Commonly known as: ZYVOX Take 1 tablet (600 mg total) by mouth 2 (two) times daily for 8 days.   meloxicam 7.5 MG tablet Commonly known as: MOBIC Take 7.5 mg by mouth as needed.   metFORMIN 500 MG tablet Commonly known as: GLUCOPHAGE TAKE 1 TABLET 3 TIMES A DAY(NEW DOSE INCREASE)   methocarbamol 500 MG tablet Commonly known as: ROBAXIN Take 1 tablet (500 mg total) by mouth every 8 (eight) hours as needed for muscle spasms.   MULTIPLE VITAMIN PO Take 1 tablet by mouth daily.   OneTouch Delica Lancets 33G Misc Use 1 each 2 (two) times daily to  test blood sugar. Extra fine   oxyCODONE-acetaminophen 5-325 MG tablet Commonly known as: PERCOCET/ROXICET Take 1 tablet by mouth every 4 (four) hours as needed for moderate pain.   potassium citrate 10 MEQ (1080 MG) SR tablet Commonly known as: UROCIT-K Take 2 tablets (20 mEq total) by mouth daily. What changed: how much to take   ROLAIDS PO Take 1 tablet by mouth daily as needed (acid reflux).        Follow-up Information     Evon Slack, PA-C. Schedule an appointment as soon as possible for a visit in 3 day(s).   Specialties: Orthopedic Surgery, Emergency Medicine Why: For wound re-check, Return to ER for worsening symptoms Contact information: [address] [place] [address] [phone]         Reubin Milan, MD. Call.   Specialty: Internal Medicine Why: Hospital follow up and repeat labs in 1-2 weeks Contact information: [address] [place] [address] [phone]                Discharge Exam: Ceasar Mons Weights   06/11/22 1215 06/11/22 1304 06/11/22 1311  Weight: 87.5 kg 21 kg 88.9 kg   General exam: awake, alert, no acute  distress HEENT: atraumatic, clear conjunctiva, anicteric sclera, moist mucus membranes, hearing grossly normal  Respiratory system: on room air, normal respiratory effort. Cardiovascular system: RRR, no pedal edema.   Central nervous system: A&O x4. no gross focal neurologic deficits, normal speech Extremities: left upper extremity swelling persistent, evolving skin changes with some erythema but no differential warmth, no drainage from elbow scab, no palpable fluctuant areas Skin: dry, intact, normal temperature, LUE erythema as above Psychiatry: normal mood, congruent affect, judgement and insight appear normal   Condition at discharge: stable  The results of significant diagnostics from this hospitalization (including imaging, microbiology, ancillary and laboratory) are listed below for reference.   Imaging Studies: [place] LT UPPER EXTREM LTD SOFT TISSUE NON VASCULAR  Result Date: 06/13/2022 CLINICAL DATA:  [id number] Pain and swelling of forearm, left [id number] EXAM: ULTRASOUND LEFT UPPER EXTREMITY LIMITED TECHNIQUE: Ultrasound examination of the upper extremity soft tissues was performed in the area of clinical concern. COMPARISON:  CT 06/11/2022 FINDINGS: There is extensive subcutaneous soft tissue swelling in the area of concern along the elbow and forearm. There is no well-defined/drainable fluid collection. There is hypoechoic soft tissue thickening along the posterior elbow, area measuring 2.6 x 1.1 x 1.5 cm. IMPRESSION: Extensive subcutaneous soft tissue swelling along the elbow and forearm, as can be seen in cellulitis. Hypoechoic soft tissue thickening along the posterior elbow, area measuring 2.6 x 1.1 x 1.5 cm, compatible with non-drainable phlegmon. No evidence of an organized soft tissue abscess. Electronically Signed   By: Caprice Renshaw M.D.   On: 06/13/2022 12:24   CT ELBOW LEFT W CONTRAST  Result Date: 06/11/2022 CLINICAL DATA:  Left elbow pain and swelling for a few days. EXAM: CT OF THE  UPPER LEFT EXTREMITY WITH CONTRAST TECHNIQUE: Multidetector CT imaging of the upper left extremity was performed according to the standard protocol following intravenous contrast administration. RADIATION DOSE REDUCTION: This exam was performed according to the departmental dose-optimization program which includes automated exposure control, adjustment of the mA and/or kV according to patient size and/or use of iterative reconstruction technique. CONTRAST:  OMNIPAQUE IOHEXOL 300 MG/ML  SOLN COMPARISON:  Radiographs, same date. FINDINGS: Diffuse and marked subcutaneous soft tissue swelling/edema involving the entire dorsal aspect of the elbow and proximal forearm.  More focal appearing fluid collection in the region of the olecranon bursa could suggest septic olecranon bursitis with surrounding cellulitis. No findings suspicious for septic arthritis or osteomyelitis. The surrounding elbow musculature is grossly normal. No obvious CT findings for myofasciitis or pyomyositis. IMPRESSION: 1. Suspect fluid collection in the region of the olecranon bursa suggesting septic olecranon bursitis with surrounding cellulitis. 2. No findings suspicious for septic arthritis or osteomyelitis. 3. No obvious CT findings for myofasciitis or pyomyositis. 4. If patient does not improve or worsens clinically, MRI without and with contrast may be helpful for further evaluation. Electronically Signed   By: Rudie Meyer M.D.   On: 06/11/2022 14:57   DG Elbow Complete Left  Result Date: 06/11/2022 CLINICAL DATA:  Left elbow pain.  No known injury. EXAM: LEFT ELBOW - COMPLETE 3+ VIEW COMPARISON:  None Available. FINDINGS: The joint spaces are maintained. No acute bony abnormality, osteochondral lesion, chondrocalcinosis or obvious joint effusion. Extensive subcutaneous soft tissue swelling/edema or possible hematoma involving the dorsal aspect of the proximal forearm. IMPRESSION: 1. No acute bony findings or significant degenerative  changes. 2. Extensive subcutaneous soft tissue swelling/edema or possible hematoma involving the dorsal aspect of the proximal forearm. Electronically Signed   By: Rudie Meyer M.D.   On: 06/11/2022 13:46   DG Chest 2 View  Result Date: 06/11/2022 CLINICAL DATA:  Upper back pain. EXAM: CHEST - 2 VIEW COMPARISON:  Chest radiographs 05/13/2011, CT abdomen and pelvis 01/01/2018 FINDINGS: Cardiac silhouette and mediastinal contours are within normal limits. The lungs are clear. No pleural effusion or pneumothorax. Mild dextrocurvature of the mid to lower thoracic spine. Mild-to-moderate multilevel degenerative disc changes. Mild height loss of the approximate T12 vertebral body is unchanged from prior radiograph and 01/01/2018 CT. IMPRESSION: No active cardiopulmonary disease. Electronically Signed   By: Neita Garnet M.D.   On: 06/11/2022 13:00    Microbiology: Results for orders placed or performed during the hospital encounter of 06/11/22  Blood culture (routine x 2)     Status: None (Preliminary result)   Collection Time: 06/11/22 12:34 PM   Specimen: BLOOD  Result Value Ref Range Status   Specimen Description BLOOD RIGHT ANTECUBITAL  Final   Special Requests   Final    BOTTLES DRAWN AEROBIC AND ANAEROBIC Blood Culture adequate volume   Culture   Final    NO GROWTH 4 DAYS Performed at [facility], [address] [place], [place] [address]    Report Status PENDING  Incomplete  Blood culture (routine x 2)     Status: None (Preliminary result)   Collection Time: 06/11/22  1:42 PM   Specimen: BLOOD RIGHT HAND  Result Value Ref Range Status   Specimen Description BLOOD RIGHT HAND  Final   Special Requests   Final    BOTTLES DRAWN AEROBIC AND ANAEROBIC Blood Culture adequate volume   Culture   Final    NO GROWTH 4 DAYS Performed at [facility], [address] [place], [place] [address]    Report Status PENDING  Incomplete    Labs: CBC: Recent Labs  Lab  06/11/22 1234 06/12/22 0453 06/13/22 0257 06/14/22 0511 06/15/22 0455  WBC 21.2* 19.5* 17.9* 19.9* 12.1*  NEUTROABS 19.0*  --   --   --   --   HGB 13.9 11.8* 11.6* 11.4* 10.8*  HCT 42.8 36.5 36.3 35.6* 33.4*  MCV 83.3 84.3 85.0 84.6 83.7  PLT 333 235 221 232 250   Basic Metabolic Panel: Recent Labs  Lab 06/11/22 1234 06/12/22 0453 06/13/22 0257 06/14/22 0511 06/15/22 0455  NA 134* 135 134* 132* 133*  K 3.6 2.8* 3.8 3.2* 3.4*  CL 100 102 103 101 102  CO2 19* 21* 24 20* 24  GLUCOSE 170* 121* 140* 165* 171*  BUN 22 12 15 12 9   CREATININE 0.63 0.69 0.65 0.53 0.52  CALCIUM 9.5 8.7* 8.6* 8.6* 8.4*  MG  --   --  1.9  --   --    Liver Function Tests: Recent Labs  Lab 06/11/22 1234  AST 25  ALT 26  ALKPHOS 115  BILITOT 0.9  PROT 7.5  ALBUMIN 4.3   CBG: Recent Labs  Lab 06/14/22 1143 06/14/22 1708 06/14/22 2128 06/15/22 0744 06/15/22 1142  GLUCAP 167* 165* 183* 166* 181*    Discharge time spent: greater than 30 minutes.  Signed: Pennie Banter, DO [facility] 06/15/2022

## 2022-06-16 ENCOUNTER — Telehealth: Payer: Self-pay

## 2022-06-16 LAB — CULTURE, BLOOD (ROUTINE X 2): Culture: NO GROWTH

## 2022-06-16 NOTE — Transitions of Care (Post Inpatient/ED Visit) (Signed)
06/16/2022  Name: Brittany Forbes MRN: [id number] DOB: May 07, 1960  Today's TOC FU Call Status: Today's TOC FU Call Status:: Successful TOC FU Call Competed TOC FU Call Complete Date: 06/16/22  Transition Care Management Follow-up Telephone Call Date of Discharge: 06/15/22 Discharge Facility: [facility]) Type of Discharge: Inpatient Admission Primary Inpatient Discharge Diagnosis:: bursitis left elbow How have you been since you were released from the hospital?: Better Any questions or concerns?: No  Items Reviewed: Did you receive and understand the discharge instructions provided?: Yes Medications obtained,verified, and reconciled?: Yes (Medications Reviewed) Any new allergies since your discharge?: No Dietary orders reviewed?: Yes Do you have support at home?: No  Medications Reviewed Today: Medications Reviewed Today     Reviewed by Karena Addison, LPN (Licensed Practical Nurse) on 06/16/22 at 1540  Med List Status: <None>   Medication Order Taking? Sig Documenting Provider Last Dose Status Informant  acetaminophen (TYLENOL) 325 MG tablet [id number] Yes Take 2 tablets (650 mg total) by mouth every 6 (six) hours as needed for mild pain or fever. Pennie Banter, DO Taking Active   atorvastatin (LIPITOR) 20 MG tablet [id number] Yes TAKE 1 TABLET DAILY Reubin Milan, MD Taking Active Self  Ca Carbonate-Mag Hydroxide (ROLAIDS PO) [id number] Yes Take 1 tablet by mouth daily as needed (acid reflux). [provider] Taking Active Self  cefdinir (OMNICEF) 300 MG capsule [id number] Yes Take 1 capsule (300 mg total) by mouth 2 (two) times daily for 5 days. Pennie Banter, DO Taking Active   Cholecalciferol 50 MCG (2000 UT) CAPS [id number] Yes Vitamin D3 50 mcg (2,000 unit) capsule [provider] Taking Active Self  empagliflozin (JARDIANCE) 25 MG TABS tablet [id number] Yes Take 1 tablet (25 mg total) by mouth daily before breakfast.  Reubin Milan, MD Taking Active Self  GLUCOSAMINE HCL-MSM PO [id number] Yes Take 1-2 tablets by mouth 2 (two) times daily. Take 2 tablets (1000mg ) in the morning and 1 tablet (500mg ) in the evening [provider] Taking Active Self  glucose blood (ONE TOUCH ULTRA TEST) test strip [id number] Yes Use to test blood sugar daily Reubin Milan, MD Taking Active Self  linezolid (ZYVOX) 600 MG tablet [id number] Yes Take 1 tablet (600 mg total) by mouth 2 (two) times daily for 8 days. Pennie Banter, DO Taking Active   meloxicam (MOBIC) 7.5 MG tablet [id number] Yes Take 7.5 mg by mouth as needed. [provider] Taking Active Self  metFORMIN (GLUCOPHAGE) 500 MG tablet [id number] Yes TAKE 1 TABLET 3 TIMES A DAY(NEW DOSE INCREASE) Reubin Milan, MD Taking Active Self  methocarbamol (ROBAXIN) 500 MG tablet [id number] Yes Take 1 tablet (500 mg total) by mouth every 8 (eight) hours as needed for muscle spasms. Pennie Banter, DO Taking Active   MULTIPLE VITAMIN PO [id number] Yes Take 1 tablet by mouth daily. [provider] Taking Active Self           Med Note Georgann Housekeeper Dec 11, 2016  4:24 PM)    Dola Argyle Lancets 33G MISC [id number] Yes Use 1 each 2 (two) times daily to test blood sugar. Extra fine Reubin Milan, MD Taking Active Self  oxyCODONE-acetaminophen (PERCOCET/ROXICET) 5-325 MG tablet [id number] Yes Take 1 tablet by mouth every 4 (four) hours as needed for moderate pain. Esaw Grandchild A, DO Taking Active   potassium citrate (UROCIT-K) 10 MEQ (1080 MG) SR tablet [id number] Yes Take 2 tablets (20 mEq total) by  mouth daily. Pennie Banter, DO Taking Active             Home Care and Equipment/Supplies: Were Home Health Services Ordered?: NA Any new equipment or medical supplies ordered?: NA  Functional Questionnaire: Do you need assistance with bathing/showering or dressing?: No Do you need assistance with meal preparation?: No Do you  need assistance with eating?: No Do you have difficulty maintaining continence: No Do you need assistance with getting out of bed/getting out of a chair/moving?: No Do you have difficulty managing or taking your medications?: No  Follow up appointments reviewed: PCP Follow-up appointment confirmed?: Yes Date of PCP follow-up appointment?: 06/24/22 Follow-up Provider: [facility] Follow-up appointment confirmed?: Yes Date of Specialist follow-up appointment?: 06/18/22 Follow-Up Specialty Provider:: Dr Floyce Stakes Do you need transportation to your follow-up appointment?: No Do you understand care options if your condition(s) worsen?: Yes-patient verbalized understanding    SIGNATURE Karena Addison, LPN [facility] Nurse Health Advisor Direct Dial [phone]

## 2022-06-24 ENCOUNTER — Encounter: Payer: Self-pay | Admitting: Internal Medicine

## 2022-06-24 ENCOUNTER — Ambulatory Visit: Payer: BC Managed Care – PPO | Admitting: Internal Medicine

## 2022-06-24 VITALS — BP 124/72 | HR 109 | Ht 60.0 in | Wt 188.0 lb

## 2022-06-24 DIAGNOSIS — I10 Essential (primary) hypertension: Secondary | ICD-10-CM | POA: Diagnosis not present

## 2022-06-24 DIAGNOSIS — M71122 Other infective bursitis, left elbow: Secondary | ICD-10-CM

## 2022-06-24 MED ORDER — LINEZOLID 600 MG PO TABS
600.0000 mg | ORAL_TABLET | Freq: Two times a day (BID) | ORAL | 0 refills | Status: AC
Start: 2022-06-24 — End: 2022-07-01

## 2022-06-24 MED ORDER — CEFDINIR 300 MG PO CAPS
300.0000 mg | ORAL_CAPSULE | Freq: Two times a day (BID) | ORAL | 0 refills | Status: AC
Start: 2022-06-24 — End: 2022-07-01

## 2022-06-24 NOTE — Progress Notes (Signed)
Date:  06/24/2022   Name:  Kanise Glace   DOB:  1960/06/26   MRN:  [id number]   Chief Complaint: Hospitalization Follow-up Hospital follow up. Admitted to [facility] 06/11/22 to 06/15/22 for septic olecranon bursitis of the left elbow.   TOC call done on 06/16/22.  HPI   Recommendations at discharge:     Follow up at [facility] by middle of this week Follow up with Primary Care in 1-2 weeks Monitor BP and resume losartan when indicated.  Losartan is held due to softer BP's. Repeat CBC, BMP, Mg in 1-2 weeks   Discharge Diagnoses: Principal Problem:   Septic olecranon bursitis of left elbow       Treated with IV Rocephin, Vancomycin --5/4 Changed IV Vancomycin >> Linezolid after discussing case with on -call ID - agreed with Linezolid for anti-toxin properties --Discharge Abx: Omnicef x 5 days, Linezolid x 8 days ( to complete 10 day course) --Ortho consulted --[facility] follow up by mid-week --Follow blood cultures - neg to date --No fluid collections amenable to drainage on exam or imaging --Elevate LUE and mobilize extremity as tolerated Active Problems:   Cellulitis of left elbow   HTN (hypertension)       --hold losartan at d/c --monitor BP at home 1-2 times daily --resume losartan when BP's consistently > 130/80 --close PCP follow up   HLD (hyperlipidemia)   Diabetes mellitus without complication (HCC)   Obesity (BMI 30-39.9)   Hypokalemia   Resolved Problems:   Sepsis (HCC)   Upper back pain   Lab Results  Component Value Date   NA 133 (L) 06/15/2022   K 3.4 (L) 06/15/2022   CO2 24 06/15/2022   GLUCOSE 171 (H) 06/15/2022   BUN 9 06/15/2022   CREATININE 0.52 06/15/2022   CALCIUM 8.4 (L) 06/15/2022   EGFR 92 09/24/2021   GFRNONAA >60 06/15/2022   Lab Results  Component Value Date   CHOL 171 09/24/2021   HDL 54 09/24/2021   LDLCALC 85 09/24/2021   TRIG 192 (H) 09/24/2021   CHOLHDL 3.2 09/24/2021   Lab Results  Component Value Date   TSH  0.749 09/24/2021   Lab Results  Component Value Date   HGBA1C 7.0 (H) 05/12/2022   Lab Results  Component Value Date   WBC 12.1 (H) 06/15/2022   HGB 10.8 (L) 06/15/2022   HCT 33.4 (L) 06/15/2022   MCV 83.7 06/15/2022   PLT 250 06/15/2022   Lab Results  Component Value Date   ALT 26 06/11/2022   AST 25 06/11/2022   ALKPHOS 115 06/11/2022   BILITOT 0.9 06/11/2022   No results found for: "25OHVITD2", "25OHVITD3", "VD25OH"   Review of Systems  Constitutional:  Negative for fatigue and unexpected weight change.  HENT:  Negative for nosebleeds.   Eyes:  Negative for visual disturbance.  Respiratory:  Negative for cough, chest tightness, shortness of breath and wheezing.   Cardiovascular:  Negative for chest pain, palpitations and leg swelling.  Gastrointestinal:  Negative for abdominal pain, constipation and diarrhea.  Musculoskeletal:  Positive for arthralgias and myalgias.  Neurological:  Negative for dizziness, weakness, light-headedness and headaches.  Psychiatric/Behavioral:  Negative for dysphoric mood and sleep disturbance. The patient is not nervous/anxious.     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   Tinnitus aurium, 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   Nephrolithiasis 01/23/2017   Microscopic hematuria 08/22/2016   Type II diabetes mellitus with complication (HCC) 06/06/2014   Primary hypertension 06/06/2014   HLD (hyperlipidemia) 06/06/2014   Persistent proteinuria associated with type 2 diabetes mellitus (HCC) 06/06/2014   Abnormal liver enzymes 09/12/2011   Status post parathyroidectomy 05/29/2011    Allergies  Allergen Reactions   Ace Inhibitors      Cough   Latex     Latex bandages cause a rash, paper tape is ok   Tape     Paper tape ok    Past Surgical History:  Procedure Laterality Date   COLONOSCOPY WITH PROPOFOL N/A 02/14/2021   Procedure: COLONOSCOPY WITH PROPOFOL;  Surgeon: Toney Reil, MD;  Location: [facility] ENDOSCOPY;  Service: Gastroenterology;  Laterality: N/A;   IR NEPHROSTOMY PLACEMENT LEFT  01/23/2017   KIDNEY STONE SURGERY Left 03/13/2005   NEPHROLITHOTOMY Left 01/23/2017   Procedure: NEPHROLITHOTOMY PERCUTANEOUS;  Surgeon: Riki Altes, MD;  Location: [facility] ORS;  Service: Urology;  Laterality: Left;   PARATHYROIDECTOMY  08/11/2011   [facility]   TONSILLECTOMY      Social History   Tobacco Use   Smoking status: Never   Smokeless tobacco: Never  Vaping Use   Vaping Use: Never used  Substance Use Topics   Alcohol use: Yes    Comment: occ   Drug use: No     Medication list has been reviewed and updated.  Current Meds  Medication Sig   acetaminophen (TYLENOL) 325 MG tablet Take 2 tablets (650 mg total) by mouth every 6 (six) hours as needed for mild pain or fever.   atorvastatin (LIPITOR) 20 MG tablet TAKE 1 TABLET DAILY   Ca Carbonate-Mag Hydroxide (ROLAIDS PO) Take 1 tablet by mouth daily as needed (acid reflux).   cefdinir (OMNICEF) 300 MG capsule Take 1 capsule (300 mg total) by mouth 2 (two) times daily for 7 days.   Cholecalciferol 50 MCG (2000 UT) CAPS Vitamin D3 50 mcg (2,000 unit) capsule   empagliflozin (JARDIANCE) 25 MG TABS tablet Take 1 tablet (25 mg total) by mouth daily before breakfast.   GLUCOSAMINE HCL-MSM PO Take 1-2 tablets by mouth 2 (two) times daily. Take 2 tablets (1000mg ) in the morning and 1 tablet (500mg ) in the evening   glucose blood (ONE TOUCH ULTRA TEST) test strip Use to test blood sugar daily   linezolid (ZYVOX) 600 MG tablet Take 1 tablet (600 mg total) by mouth 2 (two) times daily for 7 days.   meloxicam (MOBIC) 7.5 MG tablet Take 7.5 mg by mouth as needed.    metFORMIN (GLUCOPHAGE) 500 MG tablet TAKE 1 TABLET 3 TIMES A DAY(NEW DOSE INCREASE)   MULTIPLE VITAMIN PO Take 1 tablet by mouth daily.   OneTouch Delica Lancets 33G MISC Use 1 each 2 (two) times daily to test blood sugar. Extra fine   potassium citrate (UROCIT-K) 10 MEQ (1080 MG) SR tablet Take 2 tablets (20 mEq total) by mouth daily.   [DISCONTINUED] methocarbamol (ROBAXIN) 500 MG tablet Take 1 tablet (500 mg total) by mouth every 8 (eight) hours as needed for muscle spasms.   [DISCONTINUED] oxyCODONE-acetaminophen (PERCOCET/ROXICET) 5-325 MG tablet Take 1 tablet by mouth every 4 (four) hours as needed for moderate pain.       06/24/2022    3:45 PM 06/11/2022  11:17 AM 05/12/2022   11:22 AM 01/24/2022    3:33 PM  GAD 7 : Generalized Anxiety Score  Nervous, Anxious, on Edge 0 0 0 0  Control/stop worrying 0 0 0 0  Worry too much - different things 0 0 0 1  Trouble relaxing 0 0 0 1  Restless 0 0 0 0  Easily annoyed or irritable 0 0 1 2  Afraid - awful might happen 0 0 0 0  Total GAD 7 Score 0 0 1 4  Anxiety Difficulty Not difficult at all Not difficult at all Not difficult at all Somewhat difficult       06/24/2022    3:45 PM 06/11/2022   11:17 AM 05/12/2022   11:22 AM  Depression screen PHQ 2/9  Decreased Interest 1 0 1  Down, Depressed, Hopeless 0 0 0  PHQ - 2 Score 1 0 1  Altered sleeping 1 0 1  Tired, decreased energy 2 0 1  Change in appetite 1 0 0  Feeling bad or failure about yourself  0 0 0  Trouble concentrating 1 0 1  Moving slowly or fidgety/restless 0 0 0  Suicidal thoughts 0 0 0  PHQ-9 Score 6 0 4  Difficult doing work/chores Not difficult at all Not difficult at all Not difficult at all    BP Readings from Last 3 Encounters:  06/24/22 124/72  06/15/22 121/61  06/11/22 128/64    Physical Exam Vitals and nursing note reviewed.  Constitutional:      General: She is not in acute distress.    Appearance: Normal appearance. She is well-developed.  HENT:      Head: Normocephalic and atraumatic.  Cardiovascular:     Rate and Rhythm: Normal rate and regular rhythm.  Pulmonary:     Effort: Pulmonary effort is normal. No respiratory distress.     Breath sounds: No wheezing or rhonchi.  Musculoskeletal:     Left upper arm: Swelling, edema and tenderness present.     Left elbow: Swelling present. Decreased range of motion.     Left forearm: Swelling and edema present.     Cervical back: Normal range of motion.  Lymphadenopathy:     Cervical: No cervical adenopathy.  Skin:    General: Skin is warm and dry.     Findings: No rash.  Neurological:     Mental Status: She is alert and oriented to person, place, and time.  Psychiatric:        Mood and Affect: Mood normal.        Behavior: Behavior normal.     Wt Readings from Last 3 Encounters:  06/24/22 188 lb (85.3 kg)  06/11/22 196 lb (88.9 kg)  06/11/22 193 lb (87.5 kg)    BP 124/72 (BP Location: Right Arm, Cuff Size: Large)   Pulse (!) 109   Ht 5' (1.524 m)   Wt 188 lb (85.3 kg)   SpO2 97%   BMI 36.72 kg/m   Assessment and Plan:  Problem List Items Addressed This Visit     Primary hypertension - Primary    Stable exam with well controlled BP.  Currently taking losartan that was held at discharge due to borderline low BP.  Tolerating medications without concerns or side effects. Will continue to recommend low sodium diet and current regimen.       Septic olecranon bursitis of left elbow    Antibiotic course completed yesterday. Ortho follow up last week felt she was improving. Today she  has significant edema and induration with redness and warmth; skin intact Will continue antibiotics for another 7 days.  Recommend elevation as much as possible.       Relevant Medications   cefdinir (OMNICEF) 300 MG capsule   linezolid (ZYVOX) 600 MG tablet   Other Relevant Orders   CBC with Differential/Platelet   Basic metabolic panel   Magnesium    No follow-ups on file.    Partially dictated using Dragon software, any errors are not intentional.  Reubin Milan, MD [facility] [place], [place]

## 2022-06-24 NOTE — Assessment & Plan Note (Signed)
Stable exam with well controlled BP.  Currently taking losartan that was held at discharge due to borderline low BP.  Tolerating medications without concerns or side effects. Will continue to recommend low sodium diet and current regimen.

## 2022-06-24 NOTE — Assessment & Plan Note (Addendum)
Antibiotic course completed yesterday. Ortho follow up last week felt she was improving. Today she has significant edema and induration with redness and warmth; skin intact Will continue antibiotics for another 7 days.  Recommend elevation as much as possible.

## 2022-06-25 LAB — BASIC METABOLIC PANEL
BUN/Creatinine Ratio: 30 — ABNORMAL HIGH (ref 12–28)
BUN: 22 mg/dL (ref 8–27)
CO2: 23 mmol/L (ref 20–29)
Calcium: 10.3 mg/dL (ref 8.7–10.3)
Chloride: 98 mmol/L (ref 96–106)
Creatinine, Ser: 0.73 mg/dL (ref 0.57–1.00)
Glucose: 123 mg/dL — ABNORMAL HIGH (ref 70–99)
Potassium: 4.1 mmol/L (ref 3.5–5.2)
Sodium: 137 mmol/L (ref 134–144)
eGFR: 92 mL/min/{1.73_m2} (ref >59)

## 2022-06-25 LAB — CBC WITH DIFFERENTIAL/PLATELET
Basophils Absolute: 0.1 10*3/uL (ref 0.0–0.2)
Basos: 1 %
EOS (ABSOLUTE): 0.1 10*3/uL (ref 0.0–0.4)
Eos: 1 %
Hematocrit: 41.5 % (ref 34.0–46.6)
Hemoglobin: 13.1 g/dL (ref 11.1–15.9)
Immature Grans (Abs): 0 10*3/uL (ref 0.0–0.1)
Immature Granulocytes: 0 %
Lymphocytes Absolute: 2.9 10*3/uL (ref 0.7–3.1)
Lymphs: 26 %
MCH: 26.6 pg (ref 26.6–33.0)
MCHC: 31.6 g/dL (ref 31.5–35.7)
MCV: 84 fL (ref 79–97)
Monocytes Absolute: 0.8 10*3/uL (ref 0.1–0.9)
Monocytes: 7 %
Neutrophils Absolute: 7.4 10*3/uL — ABNORMAL HIGH (ref 1.4–7.0)
Neutrophils: 65 %
Platelets: 480 10*3/uL — ABNORMAL HIGH (ref 150–450)
RBC: 4.92 x10E6/uL (ref 3.77–5.28)
RDW: 14.2 % (ref 11.7–15.4)
WBC: 11.3 10*3/uL — ABNORMAL HIGH (ref 3.4–10.8)

## 2022-06-25 LAB — MAGNESIUM: Magnesium: 2 mg/dL (ref 1.6–2.3)

## 2022-06-25 NOTE — Progress Notes (Signed)
PC to pt, discussed lab results, pt voiced understanding.

## 2022-06-30 ENCOUNTER — Encounter: Payer: Self-pay | Admitting: Internal Medicine

## 2022-06-30 ENCOUNTER — Other Ambulatory Visit: Payer: Self-pay | Admitting: Internal Medicine

## 2022-06-30 DIAGNOSIS — B3731 Acute candidiasis of vulva and vagina: Secondary | ICD-10-CM

## 2022-06-30 MED ORDER — FLUCONAZOLE 100 MG PO TABS
100.0000 mg | ORAL_TABLET | Freq: Every day | ORAL | 0 refills | Status: AC
Start: 2022-06-30 — End: 2022-07-03

## 2022-06-30 NOTE — Telephone Encounter (Signed)
Please review.  KP

## 2022-07-11 ENCOUNTER — Other Ambulatory Visit: Payer: Self-pay | Admitting: Internal Medicine

## 2022-07-11 DIAGNOSIS — E118 Type 2 diabetes mellitus with unspecified complications: Secondary | ICD-10-CM

## 2022-07-16 ENCOUNTER — Inpatient Hospital Stay: Admission: RE | Admit: 2022-07-16 | Payer: BC Managed Care – PPO | Source: Ambulatory Visit

## 2022-07-29 ENCOUNTER — Ambulatory Visit: Admit: 2022-07-29 | Payer: BC Managed Care – PPO | Admitting: Orthopedic Surgery

## 2022-07-29 SURGERY — ARTHROPLASTY, KNEE, TOTAL
Anesthesia: Choice | Site: Knee | Laterality: Left

## 2022-09-15 ENCOUNTER — Other Ambulatory Visit: Payer: Self-pay | Admitting: Internal Medicine

## 2022-09-25 ENCOUNTER — Encounter: Payer: BC Managed Care – PPO | Admitting: Internal Medicine

## 2022-09-26 ENCOUNTER — Encounter: Payer: Self-pay | Admitting: Internal Medicine

## 2022-09-26 ENCOUNTER — Ambulatory Visit (INDEPENDENT_AMBULATORY_CARE_PROVIDER_SITE_OTHER): Payer: BC Managed Care – PPO | Admitting: Internal Medicine

## 2022-09-26 VITALS — BP 116/64 | HR 84 | Ht 60.0 in | Wt 182.8 lb

## 2022-09-26 DIAGNOSIS — E1169 Type 2 diabetes mellitus with other specified complication: Secondary | ICD-10-CM | POA: Diagnosis not present

## 2022-09-26 DIAGNOSIS — Z1231 Encounter for screening mammogram for malignant neoplasm of breast: Secondary | ICD-10-CM | POA: Diagnosis not present

## 2022-09-26 DIAGNOSIS — Z0000 Encounter for general adult medical examination without abnormal findings: Secondary | ICD-10-CM

## 2022-09-26 DIAGNOSIS — I10 Essential (primary) hypertension: Secondary | ICD-10-CM

## 2022-09-26 DIAGNOSIS — B3731 Acute candidiasis of vulva and vagina: Secondary | ICD-10-CM

## 2022-09-26 DIAGNOSIS — Z7984 Long term (current) use of oral hypoglycemic drugs: Secondary | ICD-10-CM

## 2022-09-26 DIAGNOSIS — E118 Type 2 diabetes mellitus with unspecified complications: Secondary | ICD-10-CM | POA: Diagnosis not present

## 2022-09-26 DIAGNOSIS — E785 Hyperlipidemia, unspecified: Secondary | ICD-10-CM

## 2022-09-26 MED ORDER — LOSARTAN POTASSIUM 50 MG PO TABS: 50.0000 mg | ORAL_TABLET | Freq: Every day | ORAL | 1 refills | Status: DC

## 2022-09-26 MED ORDER — FLUCONAZOLE 100 MG PO TABS
100.0000 mg | ORAL_TABLET | Freq: Every day | ORAL | 0 refills | Status: AC
Start: 2022-09-26 — End: 2022-10-03

## 2022-09-26 NOTE — Patient Instructions (Signed)
Call [facility] to schedule your mammogram at [phone].  

## 2022-09-26 NOTE — Progress Notes (Signed)
Date:  09/26/2022   Name:  Brittany Forbes   DOB:  05-04-1960   MRN:  [id number]   Chief Complaint: Annual Exam Brittany Forbes is a 62 y.o. female who presents today for her Complete Annual Exam. She feels well. She reports exercising/ walking/ swimming. She reports she is sleeping well. Breast complaints - none.  Mammogram: 10/2021 DEXA: none Pap smear: 08/2018 neg/neg Colonoscopy: 02/2021 repeat 10 yrs  Health Maintenance Due  Topic Date Due   COVID-19 Vaccine (3 - 2023-24 season) 10/11/2021   Diabetic kidney evaluation - Urine ACR  09/25/2022   INFLUENZA VACCINE  09/11/2022    Immunization History  Administered Date(s) Administered   Influenza,inj,Quad PF,6+ Mos 12/20/2014, 10/09/2018, 11/28/2021   Influenza-Unspecified 11/29/2016, 11/16/2019, 10/26/2020   Moderna Sars-Covid-2 Vaccination 04/09/2019, 05/07/2019   PNEUMOCOCCAL CONJUGATE-20 09/06/2020   Pneumococcal Polysaccharide-23 02/11/2011   Tdap 01/11/2011, 05/07/2021   Zoster Recombinant(Shingrix) 09/20/2019, 12/22/2019   Zoster, Live 05/18/2013    Diabetes She presents for her follow-up diabetic visit. She has type 2 diabetes mellitus. Her disease course has been improving. Pertinent negatives for hypoglycemia include no dizziness, headaches, nervousness/anxiousness or tremors. Pertinent negatives for diabetes include no chest pain, no fatigue, no polydipsia and no polyuria.  Hyperlipidemia This is a chronic problem. The problem is controlled. Pertinent negatives include no chest pain or shortness of breath. Current antihyperlipidemic treatment includes statins.    Lab Results  Component Value Date   NA 137 06/24/2022   K 4.1 06/24/2022   CO2 23 06/24/2022   GLUCOSE 123 (H) 06/24/2022   BUN 22 06/24/2022   CREATININE 0.73 06/24/2022   CALCIUM 10.3 06/24/2022   EGFR 92 06/24/2022   GFRNONAA >60 06/15/2022   Lab Results  Component Value Date   CHOL 171 09/24/2021   HDL 54 09/24/2021   LDLCALC 85  09/24/2021   TRIG 192 (H) 09/24/2021   CHOLHDL 3.2 09/24/2021   Lab Results  Component Value Date   TSH 0.749 09/24/2021   Lab Results  Component Value Date   HGBA1C 7.0 (H) 05/12/2022   Lab Results  Component Value Date   WBC 11.3 (H) 06/24/2022   HGB 13.1 06/24/2022   HCT 41.5 06/24/2022   MCV 84 06/24/2022   PLT 480 (H) 06/24/2022   Lab Results  Component Value Date   ALT 26 06/11/2022   AST 25 06/11/2022   ALKPHOS 115 06/11/2022   BILITOT 0.9 06/11/2022   No results found for: "25OHVITD2", "25OHVITD3", "VD25OH"   Review of Systems  Constitutional:  Negative for chills, fatigue and fever.  HENT:  Negative for congestion, hearing loss, tinnitus, trouble swallowing and voice change.   Eyes:  Negative for visual disturbance.  Respiratory:  Negative for cough, chest tightness, shortness of breath and wheezing.   Cardiovascular:  Negative for chest pain, palpitations and leg swelling.  Gastrointestinal:  Negative for abdominal pain, constipation, diarrhea and vomiting.  Endocrine: Negative for polydipsia and polyuria.  Genitourinary:  Negative for dysuria, frequency, genital sores, vaginal bleeding and vaginal discharge.  Musculoskeletal:  Negative for arthralgias, gait problem and joint swelling.  Skin:  Negative for color change and rash.  Neurological:  Negative for dizziness, tremors, light-headedness and headaches.  Hematological:  Negative for adenopathy. Does not bruise/bleed easily.  Psychiatric/Behavioral:  Negative for dysphoric mood and sleep disturbance. The patient is not nervous/anxious.     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   Tinnitus aurium, 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   Nephrolithiasis 01/23/2017   Microscopic hematuria 08/22/2016   Type II diabetes mellitus with complication  (HCC) 06/06/2014   Primary hypertension 06/06/2014   Hyperlipidemia associated with type 2 diabetes mellitus (HCC) 06/06/2014   Persistent proteinuria associated with type 2 diabetes mellitus (HCC) 06/06/2014   Status post parathyroidectomy 05/29/2011    Allergies  Allergen Reactions   Ace Inhibitors     Cough   Latex     Latex bandages cause a rash, paper tape is ok   Tape     Paper tape ok    Past Surgical History:  Procedure Laterality Date   COLONOSCOPY WITH PROPOFOL N/A 02/14/2021   Procedure: COLONOSCOPY WITH PROPOFOL;  Surgeon: Toney Reil, MD;  Location: [facility] ENDOSCOPY;  Service: Gastroenterology;  Laterality: N/A;   IR NEPHROSTOMY PLACEMENT LEFT  01/23/2017   KIDNEY STONE SURGERY Left 03/13/2005   NEPHROLITHOTOMY Left 01/23/2017   Procedure: NEPHROLITHOTOMY PERCUTANEOUS;  Surgeon: Riki Altes, MD;  Location: [facility] ORS;  Service: Urology;  Laterality: Left;   PARATHYROIDECTOMY  08/11/2011   [facility]   TONSILLECTOMY      Social History   Tobacco Use   Smoking status: Never   Smokeless tobacco: Never  Vaping Use   Vaping status: Never Used  Substance Use Topics   Alcohol use: Yes    Comment: occ   Drug use: No     Medication list has been reviewed and updated.  Current Meds  Medication Sig   atorvastatin (LIPITOR) 20 MG tablet TAKE 1 TABLET DAILY   Ca Carbonate-Mag Hydroxide (ROLAIDS PO) Take 1 tablet by mouth daily as needed (acid reflux).   Cholecalciferol 50 MCG (2000 UT) CAPS Vitamin D3 50 mcg (2,000 unit) capsule   fluconazole (DIFLUCAN) 100 MG tablet Take 1 tablet (100 mg total) by mouth daily for 7 days.   GLUCOSAMINE HCL-MSM PO Take 1-2 tablets by mouth 2 (two) times daily. Take 2 tablets (1000mg ) in the morning and 1 tablet (500mg ) in the evening   glucose blood (ONE TOUCH ULTRA TEST) test strip Use to test blood sugar daily   JARDIANCE 25 MG TABS tablet TAKE 1 TABLET DAILY BEFORE BREAKFAST   metFORMIN (GLUCOPHAGE) 500 MG tablet TAKE 1  TABLET 3 TIMES A DAY(NEW DOSE INCREASE)   MULTIPLE VITAMIN PO Take 1 tablet by mouth daily.   OneTouch Delica Lancets 33G MISC Use 1 each 2 (two) times daily to test blood sugar. Extra fine   potassium citrate (UROCIT-K) 10 MEQ (1080 MG) SR tablet Take 2 tablets (20 mEq total) by mouth daily.   Probiotic Product (UP4 PROBIOTICS ADULT PO) Take by mouth.   [DISCONTINUED] acetaminophen (TYLENOL) 325 MG tablet Take 2 tablets (650 mg total) by mouth every 6 (six) hours as needed for mild pain or fever.   [DISCONTINUED] losartan (COZAAR) 50 MG tablet Take 50 mg by mouth daily.   [DISCONTINUED] meloxicam (MOBIC) 7.5 MG tablet Take 7.5 mg by mouth as needed.       09/26/2022    8:41 AM 06/24/2022    3:45 PM 06/11/2022   11:17 AM 05/12/2022   11:22 AM  GAD 7 : Generalized Anxiety Score  Nervous, Anxious, on Edge 1 0 0 0  Control/stop worrying 0 0 0 0  Worry too much - different things 1 0 0 0  Trouble relaxing 1 0 0 0  Restless 0 0 0 0  Easily annoyed or irritable 1 0 0 1  Afraid - awful might happen 0 0 0 0  Total GAD 7 Score 4 0 0 1  Anxiety Difficulty Not difficult at all Not difficult at all Not difficult at all Not difficult at all       09/26/2022    8:41 AM 06/24/2022    3:45 PM 06/11/2022   11:17 AM  Depression screen PHQ 2/9  Decreased Interest 0 1 0  Down, Depressed, Hopeless 0 0 0  PHQ - 2 Score 0 1 0  Altered sleeping 0 1 0  Tired, decreased energy 1 2 0  Change in appetite 1 1 0  Feeling bad or failure about yourself  0 0 0  Trouble concentrating 0 1 0  Moving slowly or fidgety/restless 0 0 0  Suicidal thoughts 0 0 0  PHQ-9 Score 2 6 0  Difficult doing work/chores Not difficult at all Not difficult at all Not difficult at all    BP Readings from Last 3 Encounters:  09/26/22 116/64  06/24/22 124/72  06/15/22 121/61    Physical Exam Vitals and nursing note reviewed.  Constitutional:      General: She is not in acute distress.    Appearance: She is well-developed.   HENT:     Head: Normocephalic and atraumatic.     Right Ear: Tympanic membrane and ear canal normal.     Left Ear: Tympanic membrane and ear canal normal.     Nose:     Right Sinus: No maxillary sinus tenderness.     Left Sinus: No maxillary sinus tenderness.  Eyes:     General: No scleral icterus.       Right eye: No discharge.        Left eye: No discharge.     Conjunctiva/sclera: Conjunctivae normal.  Neck:     Thyroid: No thyromegaly.     Vascular: No carotid bruit.  Cardiovascular:     Rate and Rhythm: Normal rate and regular rhythm.     Pulses: Normal pulses.     Heart sounds: Normal heart sounds.  Pulmonary:     Effort: Pulmonary effort is normal. No respiratory distress.     Breath sounds: No wheezing.  Chest:  Breasts:    Right: No mass, nipple discharge, skin change or tenderness.     Left: No mass, nipple discharge, skin change or tenderness.  Abdominal:     General: Bowel sounds are normal.     Palpations: Abdomen is soft.     Tenderness: There is no abdominal tenderness.  Musculoskeletal:     Cervical back: Normal range of motion. No erythema.     Right lower leg: No edema.     Left lower leg: No edema.  Lymphadenopathy:     Cervical: No cervical adenopathy.  Skin:    General: Skin is warm and dry.     Capillary Refill: Capillary refill takes less than 2 seconds.     Findings: No rash.  Neurological:     Mental Status: She is alert and oriented to person, place, and time.     Cranial Nerves: No cranial nerve deficit.     Sensory: No sensory deficit.     Deep Tendon Reflexes: Reflexes are normal and symmetric.  Psychiatric:        Attention and Perception: Attention normal.        Mood and Affect: Mood normal.    Diabetic Foot  Exam - Simple   Simple Foot Form Diabetic Foot exam was performed with the following findings: Yes 09/26/2022  8:57 AM  Visual Inspection No deformities, no ulcerations, no other skin breakdown bilaterally: Yes Sensation  Testing Intact to touch and monofilament testing bilaterally: Yes Pulse Check Posterior Tibialis and Dorsalis pulse intact bilaterally: Yes Comments      Wt Readings from Last 3 Encounters:  09/26/22 182 lb 12.8 oz (82.9 kg)  06/24/22 188 lb (85.3 kg)  06/11/22 196 lb (88.9 kg)    BP 116/64   Pulse 84   Ht 5' (1.524 m)   Wt 182 lb 12.8 oz (82.9 kg)   SpO2 98%   BMI 35.70 kg/m   Assessment and Plan:  Problem List Items Addressed This Visit       Unprioritized   Type II diabetes mellitus with complication (HCC) (Chronic)    Blood sugars stable without hypoglycemic symptoms or events. Current regimen is metformin and Jardiance. Changes made last visit are none.  She continues to lose weight with diet change. Lab Results  Component Value Date   HGBA1C 7.0 (H) 05/12/2022         Relevant Medications   losartan (COZAAR) 50 MG tablet   Other Relevant Orders   CBC with Differential/Platelet   Comprehensive metabolic panel   Hemoglobin A1c   Microalbumin / creatinine urine ratio   TSH   Primary hypertension    Normal exam with stable BP on losartan. No concerns or side effects to current medication. No change in regimen; continue low sodium diet.       Relevant Medications   losartan (COZAAR) 50 MG tablet   Other Relevant Orders   CBC with Differential/Platelet   Comprehensive metabolic panel   Hyperlipidemia associated with type 2 diabetes mellitus (HCC)    LDL is  Lab Results  Component Value Date   LDLCALC 85 09/24/2021   Currently being treated with atorvastatin with good compliance and no concerns.       Relevant Medications   losartan (COZAAR) 50 MG tablet   Other Relevant Orders   Lipid panel   Other Visit Diagnoses     Annual physical exam    -  Primary   continue healthy diet, weight loss and exercise up to date on immunizations/screenings   Relevant Orders   CBC with Differential/Platelet   Comprehensive metabolic panel   Hemoglobin  A1c   Lipid panel   Microalbumin / creatinine urine ratio   TSH   Encounter for screening mammogram for breast cancer       Relevant Orders   MM 3D SCREENING MAMMOGRAM BILATERAL BREAST   Long term current use of oral hypoglycemic drug       Vaginal yeast infection       intermittent recurrent sym due to Jardiance will prescribe diflucan to take once every 2-4 weeks as needed   Relevant Medications   fluconazole (DIFLUCAN) 100 MG tablet   Serum calcium elevated       Relevant Orders   Parathyroid hormone, intact (no Ca)       Return in about 4 months (around 01/26/2023) for DM, HTN.    Reubin Milan, MD [facility] [place]

## 2022-09-26 NOTE — Assessment & Plan Note (Signed)
LDL is  Lab Results  Component Value Date   LDLCALC 85 09/24/2021   Currently being treated with atorvastatin with good compliance and no concerns.

## 2022-09-26 NOTE — Assessment & Plan Note (Addendum)
Blood sugars stable without hypoglycemic symptoms or events. Current regimen is metformin and Jardiance. Changes made last visit are none.  She continues to lose weight with diet change. Lab Results  Component Value Date   HGBA1C 7.0 (H) 05/12/2022

## 2022-09-26 NOTE — Assessment & Plan Note (Signed)
Normal exam with stable BP on losartan. No concerns or side effects to current medication. No change in regimen; continue low sodium diet.

## 2022-09-27 LAB — CBC WITH DIFFERENTIAL/PLATELET
Basophils Absolute: 0.1 10*3/uL (ref 0.0–0.2)
Basos: 1 %
EOS (ABSOLUTE): 0.2 10*3/uL (ref 0.0–0.4)
Eos: 2 %
Hematocrit: 44.4 % (ref 34.0–46.6)
Hemoglobin: 14 g/dL (ref 11.1–15.9)
Immature Grans (Abs): 0 10*3/uL (ref 0.0–0.1)
Immature Granulocytes: 0 %
Lymphocytes Absolute: 2.7 10*3/uL (ref 0.7–3.1)
Lymphs: 36 %
MCH: 27.2 pg (ref 26.6–33.0)
MCHC: 31.5 g/dL (ref 31.5–35.7)
MCV: 86 fL (ref 79–97)
Monocytes Absolute: 0.4 10*3/uL (ref 0.1–0.9)
Monocytes: 6 %
Neutrophils Absolute: 4.1 10*3/uL (ref 1.4–7.0)
Neutrophils: 55 %
Platelets: 279 10*3/uL (ref 150–450)
RBC: 5.14 x10E6/uL (ref 3.77–5.28)
RDW: 14.1 % (ref 11.7–15.4)
WBC: 7.4 10*3/uL (ref 3.4–10.8)

## 2022-09-27 LAB — LIPID PANEL
Chol/HDL Ratio: 2.7 ratio (ref 0.0–4.4)
Cholesterol, Total: 167 mg/dL (ref 100–199)
HDL: 63 mg/dL (ref >39)
LDL Chol Calc (NIH): 82 mg/dL (ref 0–99)
Triglycerides: 125 mg/dL (ref 0–149)
VLDL Cholesterol Cal: 22 mg/dL (ref 5–40)

## 2022-09-27 LAB — COMPREHENSIVE METABOLIC PANEL
ALT: 18 IU/L (ref 0–32)
AST: 19 IU/L (ref 0–40)
Albumin: 4.4 g/dL (ref 3.9–4.9)
Alkaline Phosphatase: 119 IU/L (ref 44–121)
BUN/Creatinine Ratio: 30 — ABNORMAL HIGH (ref 12–28)
BUN: 19 mg/dL (ref 8–27)
Bilirubin Total: 0.4 mg/dL (ref 0.0–1.2)
CO2: 26 mmol/L (ref 20–29)
Calcium: 10.2 mg/dL (ref 8.7–10.3)
Chloride: 102 mmol/L (ref 96–106)
Creatinine, Ser: 0.63 mg/dL (ref 0.57–1.00)
Globulin, Total: 2.4 g/dL (ref 1.5–4.5)
Glucose: 117 mg/dL — ABNORMAL HIGH (ref 70–99)
Potassium: 5 mmol/L (ref 3.5–5.2)
Sodium: 141 mmol/L (ref 134–144)
Total Protein: 6.8 g/dL (ref 6.0–8.5)
eGFR: 99 mL/min/{1.73_m2} (ref >59)

## 2022-09-27 LAB — HEMOGLOBIN A1C
Est. average glucose Bld gHb Est-mCnc: 143 mg/dL
Hgb A1c MFr Bld: 6.6 % — ABNORMAL HIGH (ref 4.8–5.6)

## 2022-09-27 LAB — MICROALBUMIN / CREATININE URINE RATIO
Creatinine, Urine: 46.1 mg/dL
Microalb/Creat Ratio: 35 mg/g{creat} — ABNORMAL HIGH (ref 0–29)
Microalbumin, Urine: 16.1 ug/mL

## 2022-09-27 LAB — PARATHYROID HORMONE, INTACT (NO CA): PTH: 18 pg/mL (ref 15–65)

## 2022-09-27 LAB — TSH: TSH: 0.814 u[IU]/mL (ref 0.450–4.500)

## 2022-09-30 ENCOUNTER — Other Ambulatory Visit: Payer: Self-pay | Admitting: Internal Medicine

## 2022-09-30 DIAGNOSIS — E1129 Type 2 diabetes mellitus with other diabetic kidney complication: Secondary | ICD-10-CM

## 2022-10-01 NOTE — Telephone Encounter (Signed)
Requested Prescriptions  Pending Prescriptions Disp Refills   metFORMIN (GLUCOPHAGE) 500 MG tablet [Pharmacy Med Name: METFORMIN TAB 500MG ] 270 tablet 1    Sig: TAKE 1 TABLET 3 TIMES A DAY     Endocrinology:  Diabetes - Biguanides Failed - 09/30/2022  2:23 AM      Failed - B12 Level in normal range and within 720 days    No results found for: "VITAMINB12"       Passed - Cr in normal range and within 360 days    Creatinine, Ser  Date Value Ref Range Status  09/26/2022 0.63 0.57 - 1.00 mg/dL Final         Passed - HBA1C is between 0 and 7.9 and within 180 days    Hgb A1c MFr Bld  Date Value Ref Range Status  09/26/2022 6.6 (H) 4.8 - 5.6 % Final    Comment:             Prediabetes: 5.7 - 6.4          Diabetes: >6.4          Glycemic control for adults with diabetes: <7.0          Passed - eGFR in normal range and within 360 days    GFR calc Af Amer  Date Value Ref Range Status  09/05/2019 117 >59 mL/min/1.73 Final    Comment:    **[facility] currently reports eGFR in compliance with the current**   recommendations of the [employer]. [facility] will   update reporting as new guidelines are published from the NKF-ASN   Task force.    GFR, Estimated  Date Value Ref Range Status  06/15/2022 >60 >60 mL/min Final    Comment:    (NOTE) Calculated using the CKD-EPI Creatinine Equation (2021)    eGFR  Date Value Ref Range Status  09/26/2022 99 >59 mL/min/1.73 Final         Passed - Valid encounter within last 6 months    Recent Outpatient Visits           5 days ago Annual physical exam   [place] [facility] at [facility], Nyoka Cowden, MD   3 months ago Primary hypertension   [place] [facility] at [facility], Nyoka Cowden, MD   3 months ago Tachycardia   [facility] at [facility], Nyoka Cowden, MD   4 months ago Essential (primary) hypertension   [facility] at [facility], Nyoka Cowden, MD   8 months ago Type II diabetes mellitus with complication [facility])   [place] [facility] at [facility], Nyoka Cowden, MD       Future Appointments             In 3 months Judithann Graves Nyoka Cowden, MD [facility] Medicine at [facility], [facility]            Passed - CBC within normal limits and completed in the last 12 months    WBC  Date Value Ref Range Status  09/26/2022 7.4 3.4 - 10.8 x10E3/uL Final  06/15/2022 12.1 (H) 4.0 - 10.5 K/uL Final   RBC  Date Value Ref Range Status  09/26/2022 5.14 3.77 - 5.28 x10E6/uL Final  06/15/2022 3.99 3.87 - 5.11 MIL/uL Final   Hemoglobin  Date Value Ref Range Status  09/26/2022 14.0 11.1 - 15.9 g/dL Final   Hematocrit  Date Value Ref Range Status  09/26/2022 44.4 34.0 - 46.6 % Final   MCHC  Date Value Ref Range Status  09/26/2022 31.5 31.5 - 35.7 g/dL Final  09/81/1914 78.2 30.0 - 36.0 g/dL Final   [facility]  Date Value Ref Range Status  09/26/2022 27.2 26.6 - 33.0 pg Final  06/15/2022 27.1 26.0 - 34.0 pg Final   MCV  Date Value Ref Range Status  09/26/2022 86 79 - 97 fL Final   No results found for: "PLTCOUNTKUC", "LABPLAT", "POCPLA" RDW  Date Value Ref Range Status  09/26/2022 14.1 11.7 - 15.4 % Final

## 2022-11-12 ENCOUNTER — Ambulatory Visit
Admission: RE | Admit: 2022-11-12 | Discharge: 2022-11-12 | Disposition: A | Discharge status: Home / Self Care | Payer: BC Managed Care – PPO | Source: Ambulatory Visit | Attending: Internal Medicine | Admitting: Internal Medicine

## 2022-11-12 DIAGNOSIS — Z1231 Encounter for screening mammogram for malignant neoplasm of breast: Secondary | ICD-10-CM | POA: Insufficient documentation

## 2022-12-04 ENCOUNTER — Telehealth: Payer: Self-pay | Admitting: Internal Medicine

## 2022-12-04 NOTE — Telephone Encounter (Signed)
Copied from CRM [id number]. Topic: Complaint - Billing/Coding >> Dec 03, 2022  4:37 PM Ja-Kwan M wrote: DOS: 09/26/22 Details of complaint: Pt stated that she received a bill from [employer] for labs that were drawn for her annual physical How would the patient like to see this issue resolved? Pt requests that the billing be resubmitted with the correct coding because she should not have received a bill for labs for annual physical. Pt requests call back to advise. Cb# [phone]   Route to Research officer, political party.

## 2023-01-02 LAB — HM DIABETES EYE EXAM

## 2023-01-26 ENCOUNTER — Encounter: Payer: Self-pay | Admitting: Internal Medicine

## 2023-01-26 ENCOUNTER — Ambulatory Visit: Payer: BC Managed Care – PPO | Admitting: Internal Medicine

## 2023-01-26 VITALS — BP 122/68 | HR 92 | Ht 60.0 in | Wt 187.0 lb

## 2023-01-26 DIAGNOSIS — E118 Type 2 diabetes mellitus with unspecified complications: Secondary | ICD-10-CM

## 2023-01-26 DIAGNOSIS — Z7984 Long term (current) use of oral hypoglycemic drugs: Secondary | ICD-10-CM

## 2023-01-26 DIAGNOSIS — I10 Essential (primary) hypertension: Secondary | ICD-10-CM | POA: Diagnosis not present

## 2023-01-26 LAB — POCT GLYCOSYLATED HEMOGLOBIN (HGB A1C): Hemoglobin A1C: 6.5 % — AB (ref 4.0–5.6)

## 2023-01-26 NOTE — Assessment & Plan Note (Signed)
 Controlled BP with normal exam. Current regimen is losartan. Will continue same medications; encourage continued reduced sodium diet.

## 2023-01-26 NOTE — Assessment & Plan Note (Addendum)
Blood sugars stable without hypoglycemic symptoms or events. Currently managed with MTF and Jardiance. Changes made last visit are none. Lab Results  Component Value Date   HGBA1C 6.6 (H) 09/26/2022  A1C today - 6.5

## 2023-01-26 NOTE — Progress Notes (Signed)
Date:  01/26/2023   Name:  Brittany Forbes   DOB:  01-Dec-1960   MRN:  [id number]   Chief Complaint: Diabetes  Diabetes She presents for her follow-up diabetic visit. She has type 2 diabetes mellitus. Her disease course has been stable. Pertinent negatives for hypoglycemia include no headaches or tremors. Pertinent negatives for diabetes include no chest pain, no fatigue, no polydipsia and no polyuria. Current diabetic treatments: MTF and Jardiance.  Hypertension This is a chronic problem. The problem is controlled. Pertinent negatives include no chest pain, headaches, palpitations or shortness of breath. Past treatments include angiotensin blockers.    Review of Systems  Constitutional:  Negative for appetite change, fatigue, fever and unexpected weight change.  HENT:  Negative for tinnitus and trouble swallowing.   Eyes:  Negative for visual disturbance.  Respiratory:  Negative for cough, chest tightness and shortness of breath.   Cardiovascular:  Negative for chest pain, palpitations and leg swelling.  Gastrointestinal:  Negative for abdominal pain.  Endocrine: Negative for polydipsia and polyuria.  Genitourinary:  Negative for dysuria and hematuria.  Musculoskeletal:  Negative for arthralgias.  Neurological:  Negative for tremors, numbness and headaches.  Psychiatric/Behavioral:  Negative for dysphoric mood.      Lab Results  Component Value Date   NA 141 09/26/2022   K 5.0 09/26/2022   CO2 26 09/26/2022   GLUCOSE 117 (H) 09/26/2022   BUN 19 09/26/2022   CREATININE 0.63 09/26/2022   CALCIUM 10.2 09/26/2022   EGFR 99 09/26/2022   GFRNONAA >60 06/15/2022   Lab Results  Component Value Date   CHOL 167 09/26/2022   HDL 63 09/26/2022   LDLCALC 82 09/26/2022   TRIG 125 09/26/2022   CHOLHDL 2.7 09/26/2022   Lab Results  Component Value Date   TSH 0.814 09/26/2022   Lab Results  Component Value Date   HGBA1C 6.5 (A) 01/26/2023   Lab Results  Component Value  Date   WBC 7.4 09/26/2022   HGB 14.0 09/26/2022   HCT 44.4 09/26/2022   MCV 86 09/26/2022   PLT 279 09/26/2022   Lab Results  Component Value Date   ALT 18 09/26/2022   AST 19 09/26/2022   ALKPHOS 119 09/26/2022   BILITOT 0.4 09/26/2022   No results found for: "25OHVITD2", "25OHVITD3", "VD25OH"   Patient Active Problem List   Diagnosis Date Noted   Hypokalemia 06/12/2022   Obesity (BMI 30-39.9) 06/11/2022   Bilateral primary osteoarthritis of knee 05/12/2022   Polyp of sigmoid colon    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   Tinnitus aurium, 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   Nephrolithiasis 01/23/2017   Microscopic hematuria 08/22/2016   Type II diabetes mellitus with complication (HCC) 06/06/2014   Primary hypertension 06/06/2014   Hyperlipidemia associated with type 2 diabetes mellitus (HCC) 06/06/2014   Persistent proteinuria associated with type 2 diabetes mellitus (HCC) 06/06/2014   Status post parathyroidectomy 05/29/2011    Allergies  Allergen Reactions   Ace Inhibitors     Cough   Latex     Latex bandages cause a rash, paper tape is ok   Tape     Paper tape ok    Past Surgical History:  Procedure Laterality Date   COLONOSCOPY WITH PROPOFOL N/A 02/14/2021   Procedure: COLONOSCOPY WITH PROPOFOL;  Surgeon: Toney Reil, MD;  Location: [facility] ENDOSCOPY;  Service: Gastroenterology;  Laterality: N/A;   IR NEPHROSTOMY PLACEMENT LEFT  01/23/2017   KIDNEY STONE SURGERY Left 03/13/2005   NEPHROLITHOTOMY Left 01/23/2017   Procedure:  NEPHROLITHOTOMY PERCUTANEOUS;  Surgeon: Riki Altes, MD;  Location: [facility] ORS;  Service: Urology;  Laterality: Left;   PARATHYROIDECTOMY  08/11/2011   [facility]   TONSILLECTOMY      Social History   Tobacco Use   Smoking status: Never   Smokeless tobacco: Never  Vaping Use   Vaping status: Never Used  Substance Use Topics   Alcohol use: Yes    Comment: occ   Drug use: No     Medication list has been reviewed and updated.  Current Meds   Medication Sig   atorvastatin (LIPITOR) 20 MG tablet TAKE 1 TABLET DAILY   Cholecalciferol 50 MCG (2000 UT) CAPS Vitamin D3 50 mcg (2,000 unit) capsule   GLUCOSAMINE HCL-MSM PO Take 1-2 tablets by mouth 2 (two) times daily. Take 2 tablets (1000mg ) in the morning and 1 tablet (500mg ) in the evening   glucose blood (ONE TOUCH ULTRA TEST) test strip Use to test blood sugar daily   JARDIANCE 25 MG TABS tablet TAKE 1 TABLET DAILY BEFORE BREAKFAST   losartan (COZAAR) 50 MG tablet Take 1 tablet (50 mg total) by mouth daily.   metFORMIN (GLUCOPHAGE) 500 MG tablet TAKE 1 TABLET 3 TIMES A DAY   MULTIPLE VITAMIN PO Take 1 tablet by mouth daily.   OneTouch Delica Lancets 33G MISC Use 1 each 2 (two) times daily to test blood sugar. Extra fine   potassium citrate (UROCIT-K) 10 MEQ (1080 MG) SR tablet Take 2 tablets (20 mEq total) by mouth daily.   Probiotic Product (UP4 PROBIOTICS ADULT PO) Take by mouth.       01/26/2023    3:54 PM 09/26/2022    8:41 AM 06/24/2022    3:45 PM 06/11/2022   11:17 AM  GAD 7 : Generalized Anxiety Score  Nervous, Anxious, on Edge 0 1 0 0  Control/stop worrying 0 0 0 0  Worry too much - different things 0 1 0 0  Trouble relaxing 0 1 0 0  Restless 0 0 0 0  Easily annoyed or irritable 0 1 0 0  Afraid - awful might happen 0 0 0 0  Total GAD 7 Score 0 4 0 0  Anxiety Difficulty Not difficult at all Not difficult at all Not difficult at all Not difficult at all       01/26/2023    3:54 PM 09/26/2022    8:41 AM 06/24/2022    3:45 PM  Depression screen PHQ 2/9  Decreased Interest 1 0 1  Down, Depressed, Hopeless 0 0 0  PHQ - 2 Score 1 0 1  Altered sleeping 2 0 1  Tired, decreased energy 1 1 2   Change in appetite 0 1 1  Feeling bad or failure about yourself  0 0 0  Trouble concentrating 0 0 1  Moving slowly or fidgety/restless 0 0 0  Suicidal thoughts 0 0 0  PHQ-9 Score 4 2 6   Difficult doing work/chores Not difficult at all Not difficult at all Not difficult at all     BP Readings from Last 3 Encounters:  01/26/23 122/68  09/26/22 116/64  06/24/22 124/72    Physical Exam  Wt Readings from Last 3 Encounters:  01/26/23 187 lb (84.8 kg)  09/26/22 182 lb 12.8 oz (82.9 kg)  06/24/22 188 lb (85.3 kg)    BP 122/68   Pulse 92   Ht 5' (1.524 m)   Wt 187 lb (84.8 kg)   SpO2 96%   BMI 36.52 kg/m  Assessment and Plan:  Problem List Items Addressed This Visit       Unprioritized   Type II diabetes mellitus with complication (HCC) - Primary (Chronic)   Blood sugars stable without hypoglycemic symptoms or events. Currently managed with MTF and Jardiance. Changes made last visit are none. Lab Results  Component Value Date   HGBA1C 6.6 (H) 09/26/2022  A1C today - 6.5       Relevant Orders   POCT glycosylated hemoglobin (Hb A1C) (Completed)   Primary hypertension   Controlled BP with normal exam. Current regimen is losartan. Will continue same medications; encourage continued reduced sodium diet.       Other Visit Diagnoses       Long term current use of oral hypoglycemic drug           Return in about 4 months (around 05/27/2023) for DM, HTN.    Reubin Milan, MD [facility] Primary [facility] and [facility] Medicine [place]

## 2023-01-30 ENCOUNTER — Other Ambulatory Visit: Payer: Self-pay | Admitting: Internal Medicine

## 2023-01-30 DIAGNOSIS — E118 Type 2 diabetes mellitus with unspecified complications: Secondary | ICD-10-CM

## 2023-01-30 NOTE — Telephone Encounter (Signed)
Requested Prescriptions  Pending Prescriptions Disp Refills   JARDIANCE 25 MG TABS tablet [Pharmacy Med Name: JARDIANCE TAB 25MG ] 90 tablet 1    Sig: TAKE 1 TABLET DAILY BEFORE BREAKFAST     Endocrinology:  Diabetes - SGLT2 Inhibitors Passed - 01/30/2023  1:22 PM      Passed - Cr in normal range and within 360 days    Creatinine, Ser  Date Value Ref Range Status  09/26/2022 0.63 0.57 - 1.00 mg/dL Final         Passed - HBA1C is between 0 and 7.9 and within 180 days    Hemoglobin A1C  Date Value Ref Range Status  01/26/2023 6.5 (A) 4.0 - 5.6 % Final   Hgb A1c MFr Bld  Date Value Ref Range Status  09/26/2022 6.6 (H) 4.8 - 5.6 % Final    Comment:             Prediabetes: 5.7 - 6.4          Diabetes: >6.4          Glycemic control for adults with diabetes: <7.0          Passed - eGFR in normal range and within 360 days    GFR calc Af Amer  Date Value Ref Range Status  09/05/2019 117 >59 mL/min/1.73 Final    Comment:    **[facility] currently reports eGFR in compliance with the current**   recommendations of the [employer]. [facility] will   update reporting as new guidelines are published from the NKF-ASN   Task force.    GFR, Estimated  Date Value Ref Range Status  06/15/2022 >60 >60 mL/min Final    Comment:    (NOTE) Calculated using the CKD-EPI Creatinine Equation (2021)    eGFR  Date Value Ref Range Status  09/26/2022 99 >59 mL/min/1.73 Final         Passed - Valid encounter within last 6 months    Recent Outpatient Visits           4 days ago Type II diabetes mellitus with complication [facility])   [place] [facility] at [facility], Nyoka Cowden, MD   4 months ago Annual physical exam   [facility] at [facility], Nyoka Cowden, MD   7 months ago Primary hypertension   [place] [facility] Medicine at [facility], Nyoka Cowden, MD   7 months ago Tachycardia    [facility] at [facility], Nyoka Cowden, MD   8 months ago Essential (primary) hypertension   [place] [facility] at [facility], Nyoka Cowden, MD       Future Appointments             In 3 months Judithann Graves, Nyoka Cowden, MD [facility] Primary Care & Sports Medicine at [facility], [facility]

## 2023-03-09 ENCOUNTER — Other Ambulatory Visit: Payer: Self-pay | Admitting: Internal Medicine

## 2023-03-10 NOTE — Telephone Encounter (Signed)
Requested Prescriptions  Pending Prescriptions Disp Refills   atorvastatin (LIPITOR) 20 MG tablet [Pharmacy Med Name: ATORVASTATIN TAB 20MG ] 90 tablet 1    Sig: TAKE 1 TABLET DAILY     Cardiovascular:  Antilipid - Statins Failed - 03/10/2023  5:25 PM      Failed - Lipid Panel in normal range within the last 12 months    Cholesterol, Total  Date Value Ref Range Status  09/26/2022 167 100 - 199 mg/dL Final   LDL Chol Calc (NIH)  Date Value Ref Range Status  09/26/2022 82 0 - 99 mg/dL Final   HDL  Date Value Ref Range Status  09/26/2022 63 >39 mg/dL Final   Triglycerides  Date Value Ref Range Status  09/26/2022 125 0 - 149 mg/dL Final         Passed - Patient is not pregnant      Passed - Valid encounter within last 12 months    Recent Outpatient Visits           1 month ago Type II diabetes mellitus with complication (HCC)   [place] [facility] Medicine at [facility], Nyoka Cowden, MD   5 months ago Annual physical exam   [facility] at [facility], Nyoka Cowden, MD   8 months ago Primary hypertension    Primary Care & Sports Medicine at [facility], Nyoka Cowden, MD   9 months ago Tachycardia   [facility] Medicine at [facility], Nyoka Cowden, MD   10 months ago Essential (primary) hypertension   [facility] Primary Care & Sports Medicine at [facility], Nyoka Cowden, MD       Future Appointments             In 2 months Judithann Graves, Nyoka Cowden, MD [facility] Primary Care & Sports Medicine at [facility], [facility]

## 2023-03-29 ENCOUNTER — Other Ambulatory Visit: Payer: Self-pay | Admitting: Internal Medicine

## 2023-03-29 DIAGNOSIS — E1129 Type 2 diabetes mellitus with other diabetic kidney complication: Secondary | ICD-10-CM

## 2023-03-29 DIAGNOSIS — I10 Essential (primary) hypertension: Secondary | ICD-10-CM

## 2023-03-30 NOTE — Telephone Encounter (Signed)
 Requested Prescriptions  Pending Prescriptions Disp Refills   metFORMIN (GLUCOPHAGE) 500 MG tablet [Pharmacy Med Name: METFORMIN TAB 500MG ] 270 tablet 1    Sig: TAKE 1 TABLET 3 TIMES A DAY     Endocrinology:  Diabetes - Biguanides Failed - 03/30/2023  5:48 PM      Failed - B12 Level in normal range and within 720 days    No results found for: "VITAMINB12"       Passed - Cr in normal range and within 360 days    Creatinine, Ser  Date Value Ref Range Status  09/26/2022 0.63 0.57 - 1.00 mg/dL Final         Passed - HBA1C is between 0 and 7.9 and within 180 days    Hemoglobin A1C  Date Value Ref Range Status  01/26/2023 6.5 (A) 4.0 - 5.6 % Final   Hgb A1c MFr Bld  Date Value Ref Range Status  09/26/2022 6.6 (H) 4.8 - 5.6 % Final    Comment:             Prediabetes: 5.7 - 6.4          Diabetes: >6.4          Glycemic control for adults with diabetes: <7.0          Passed - eGFR in normal range and within 360 days    GFR calc Af Amer  Date Value Ref Range Status  09/05/2019 117 >59 mL/min/1.73 Final    Comment:    **[facility] currently reports eGFR in compliance with the current**   recommendations of the [employer]. [facility] will   update reporting as new guidelines are published from the NKF-ASN   Task force.    GFR, Estimated  Date Value Ref Range Status  06/15/2022 >60 >60 mL/min Final    Comment:    (NOTE) Calculated using the CKD-EPI Creatinine Equation (2021)    eGFR  Date Value Ref Range Status  09/26/2022 99 >59 mL/min/1.73 Final         Passed - Valid encounter within last 6 months    Recent Outpatient Visits           2 months ago Type II diabetes mellitus with complication [facility])   [place] [facility] Sports Medicine at [facility], Nyoka Cowden, MD   6 months ago Annual physical exam   [facility] Sports [facility] at [facility], Nyoka Cowden, MD   9 months ago Primary hypertension   [place]  Primary Care & Sports Medicine at [facility], Nyoka Cowden, MD   9 months ago Tachycardia   [facility] Primary Care & Sports Medicine at [facility], Nyoka Cowden, MD   10 months ago Essential (primary) hypertension   [place] [facility] Care & Sports Medicine at [facility], Nyoka Cowden, MD       Future Appointments             In 1 month Judithann Graves Nyoka Cowden, MD [facility] Primary Care & Sports Medicine at [facility], [facility]            Passed - CBC within normal limits and completed in the last 12 months    WBC  Date Value Ref Range Status  09/26/2022 7.4 3.4 - 10.8 x10E3/uL Final  06/15/2022 12.1 (H) 4.0 - 10.5 K/uL Final   RBC  Date Value Ref Range Status  09/26/2022 5.14 3.77 - 5.28  x10E6/uL Final  06/15/2022 3.99 3.87 - 5.11 MIL/uL Final   Hemoglobin  Date Value Ref Range Status  09/26/2022 14.0 11.1 - 15.9 g/dL Final   Hematocrit  Date Value Ref Range Status  09/26/2022 44.4 34.0 - 46.6 % Final   MCHC  Date Value Ref Range Status  09/26/2022 31.5 31.5 - 35.7 g/dL Final  84/13/2440 10.2 30.0 - 36.0 g/dL Final   [facility]  Date Value Ref Range Status  09/26/2022 27.2 26.6 - 33.0 pg Final  06/15/2022 27.1 26.0 - 34.0 pg Final   MCV  Date Value Ref Range Status  09/26/2022 86 79 - 97 fL Final   No results found for: "PLTCOUNTKUC", "LABPLAT", "POCPLA" RDW  Date Value Ref Range Status  09/26/2022 14.1 11.7 - 15.4 % Final          losartan (COZAAR) 50 MG tablet [Pharmacy Med Name: LOSARTAN POT TAB 50MG ] 90 tablet 1    Sig: TAKE 1 TABLET DAILY     Cardiovascular:  Angiotensin Receptor Blockers Failed - 03/30/2023  5:48 PM      Failed - Cr in normal range and within 180 days    Creatinine, Ser  Date Value Ref Range Status  09/26/2022 0.63 0.57 - 1.00 mg/dL Final         Failed - K in normal range and within 180 days    Potassium  Date Value Ref Range Status  09/26/2022 5.0 3.5 - 5.2 mmol/L Final         Passed - Patient  is not pregnant      Passed - Last BP in normal range    BP Readings from Last 1 Encounters:  01/26/23 122/68         Passed - Valid encounter within last 6 months    Recent Outpatient Visits           2 months ago Type II diabetes mellitus with complication [facility])   [place] Primary Care & Sports Medicine at [facility], Nyoka Cowden, MD   6 months ago Annual physical exam   [facility] Primary Care & Sports Medicine at [facility], Nyoka Cowden, MD   9 months ago Primary hypertension   [place] Primary Care & Sports Medicine at [facility], Nyoka Cowden, MD   9 months ago Tachycardia   [facility] Primary Care & Sports Medicine at [facility], Nyoka Cowden, MD   10 months ago Essential (primary) hypertension   [facility] Primary Care & Sports Medicine at [facility], Nyoka Cowden, MD       Future Appointments             In 1 month Judithann Graves, Nyoka Cowden, MD [facility] Health Primary Care & Sports Medicine at [facility]

## 2023-05-06 ENCOUNTER — Other Ambulatory Visit: Payer: Self-pay | Admitting: Internal Medicine

## 2023-05-06 DIAGNOSIS — N200 Calculus of kidney: Secondary | ICD-10-CM

## 2023-05-06 NOTE — Telephone Encounter (Signed)
 Copied from CRM [id number]. Topic: Clinical - Medication Refill >> May 06, 2023 11:51 AM Mayer Masker wrote: Most Recent Primary Care Visit:  Provider: Reubin Milan  Department: [facility] [place]  Visit Type: OFFICE VISIT  Date: 01/26/2023  Medication: potassium citrate (UROCIT-K) 10 MEQ (1080 MG) SR tablet [[id number]]  Has the patient contacted their pharmacy? Yes (Agent: If no, request that the patient contact the pharmacy for the refill. If patient does not wish to contact the pharmacy document the reason why and proceed with request.) (Agent: If yes, when and what did the pharmacy advise?)  Is this the correct pharmacy for this prescription? Yes If no, delete pharmacy and type the correct one.  This is the patient's preferred pharmacy:  [id number] [place], [place] - [address] [place] [address] Phone: [phone] Fax: [phone]  [facility] [place], [place] - [address] [facility] AT Portal to Registered [facility] Sites [address] [place] [address] Phone: [phone] Fax: [phone]   Has the prescription been filled recently? Yes  Is the patient out of the medication? No  Has the patient been seen for an appointment in the last year OR does the patient have an upcoming appointment? Yes  Can we respond through MyChart? Yes  Agent: Please be advised that Rx refills may take up to 3 business days. We ask that you follow-up with your pharmacy.

## 2023-05-07 NOTE — Telephone Encounter (Signed)
 Requested medication (s) are due for refill today: yes  Requested medication (s) are on the active medication list: yes  Last refill:  06/15/22 #90  Future visit scheduled: yes  Notes to clinic:  med was last ordered by hospitalist at hospital discharge- overdue lab work    Requested Prescriptions  Pending Prescriptions Disp Refills   potassium citrate (UROCIT-K) 10 MEQ (1080 MG) SR tablet 90 tablet 0    Sig: Take 2 tablets (20 mEq total) by mouth daily.     Endocrinology:  Minerals - Potassium Citrate Failed - 05/07/2023  4:23 PM      Failed - CO2 in normal range and within 120 days    CO2  Date Value Ref Range Status  09/26/2022 26 20 - 29 mmol/L Final         Failed - Cl in normal range and within 120 days    Chloride  Date Value Ref Range Status  09/26/2022 102 96 - 106 mmol/L Final         Failed - Cr in normal range and within 120 days    Creatinine, Ser  Date Value Ref Range Status  09/26/2022 0.63 0.57 - 1.00 mg/dL Final         Failed - K in normal range and within 120 days    Potassium  Date Value Ref Range Status  09/26/2022 5.0 3.5 - 5.2 mmol/L Final         Failed - Na in normal range and within 120 days    Sodium  Date Value Ref Range Status  09/26/2022 141 134 - 144 mmol/L Final         Failed - WBC in normal range and within 120 days    WBC  Date Value Ref Range Status  09/26/2022 7.4 3.4 - 10.8 x10E3/uL Final  06/15/2022 12.1 (H) 4.0 - 10.5 K/uL Final         Failed - HGB in normal range and within 120 days    Hemoglobin  Date Value Ref Range Status  09/26/2022 14.0 11.1 - 15.9 g/dL Final         Failed - HCT in normal range and within 120 days    Hematocrit  Date Value Ref Range Status  09/26/2022 44.4 34.0 - 46.6 % Final         Failed - PLT in normal range and within 120 days    Platelets  Date Value Ref Range Status  09/26/2022 279 150 - 450 x10E3/uL Final         Failed - Urinalysis completed in last 4 months.    Specific  Gravity, UA  Date Value Ref Range Status  01/30/2017 <1.005 (L) 1.005 - 1.030 Final   Spec Grav, UA  Date Value Ref Range Status  09/06/2020 >=1.030 (A) 1.010 - 1.025 Final   Specific Gravity, Urine  Date Value Ref Range Status  06/11/2022 1.036 (H) 1.005 - 1.030 Final   Glucose, UA  Date Value Ref Range Status  06/11/2022 >=500 (A) NEGATIVE mg/dL Final  29/56/2130 Negative Negative Final   Urine Culture, Comprehensive  Date Value Ref Range Status  10/22/2016 Final report  Final   Urine Culture, Routine  Date Value Ref Range Status  09/06/2020 Final report  Final   Urobilinogen, UA  Date Value Ref Range Status  09/06/2020 0.2 0.2 or 1.0 E.U./dL Final   Protein, ur  Date Value Ref Range Status  06/11/2022 NEGATIVE NEGATIVE mg/dL Final   Total  Protein  Date Value Ref Range Status  09/26/2022 6.8 6.0 - 8.5 g/dL Final   Nitrite  Date Value Ref Range Status  06/11/2022 NEGATIVE NEGATIVE Final   RBC, UA  Date Value Ref Range Status  01/30/2017 >30 (H) 0 - 2 /hpf Final   RBC / HPF  Date Value Ref Range Status  06/11/2022 0-5 0 - 5 RBC/hpf Final   WBC, UA  Date Value Ref Range Status  06/11/2022 21-50 0 - 5 WBC/hpf Final         Passed - Valid encounter within last 4 months    Recent Outpatient Visits   None

## 2023-05-08 NOTE — Telephone Encounter (Signed)
 Please review

## 2023-05-10 MED ORDER — POTASSIUM CITRATE ER 10 MEQ (1080 MG) PO TBCR
20.0000 meq | EXTENDED_RELEASE_TABLET | Freq: Every day | ORAL | 0 refills | Status: DC
Start: 2023-05-10 — End: 2023-06-25

## 2023-05-18 ENCOUNTER — Encounter: Payer: Self-pay | Admitting: Internal Medicine

## 2023-05-18 ENCOUNTER — Ambulatory Visit: Payer: Self-pay | Admitting: Internal Medicine

## 2023-05-18 VITALS — BP 110/64 | HR 78 | Ht 60.0 in | Wt 190.1 lb

## 2023-05-18 DIAGNOSIS — I10 Essential (primary) hypertension: Secondary | ICD-10-CM | POA: Diagnosis not present

## 2023-05-18 DIAGNOSIS — M170 Bilateral primary osteoarthritis of knee: Secondary | ICD-10-CM | POA: Diagnosis not present

## 2023-05-18 DIAGNOSIS — E118 Type 2 diabetes mellitus with unspecified complications: Secondary | ICD-10-CM

## 2023-05-18 DIAGNOSIS — Z7984 Long term (current) use of oral hypoglycemic drugs: Secondary | ICD-10-CM | POA: Diagnosis not present

## 2023-05-18 LAB — POCT GLYCOSYLATED HEMOGLOBIN (HGB A1C): Hemoglobin A1C: 6.1 % — AB (ref 4.0–5.6)

## 2023-05-18 NOTE — Assessment & Plan Note (Signed)
 Doing fairly well - postponed TKA until after retirement in 1.5 years Plans to start some pool exercise in lieu of walking. Will continue glucosamine

## 2023-05-18 NOTE — Assessment & Plan Note (Addendum)
 Blood sugars have been stable.  No recent hypoglycemic events requiring assistance. Currently medications are jardiance and MTF.  A1C improved today from 6.5. Lab Results  Component Value Date   HGBA1C 6.1 (A) 05/18/2023   Last visit no changes were made; will continue current regimen.

## 2023-05-18 NOTE — Assessment & Plan Note (Signed)
 Blood pressure is well controlled.  Current medications losartan Will continue same regimen along with efforts to limit dietary sodium.

## 2023-05-18 NOTE — Progress Notes (Signed)
 Date:  05/18/2023   Name:  Brittany Forbes   DOB:  August 16, 1960   MRN:  [id number]   Chief Complaint: Diabetes and Hypertension  Diabetes She presents for her follow-up diabetic visit. She has type 2 diabetes mellitus. Her disease course has been stable. Pertinent negatives for hypoglycemia include no dizziness, headaches, nervousness/anxiousness or tremors. Pertinent negatives for diabetes include no chest pain, no fatigue, no foot paresthesias, no polydipsia, no polyuria, no visual change and no weakness. Symptoms are stable. Pertinent negatives for diabetic complications include no CVA. Current diabetic treatments: Jardiance and MTF.  Hypertension This is a chronic problem. The problem is controlled. Pertinent negatives include no chest pain, headaches, palpitations or shortness of breath. Past treatments include angiotensin blockers. The current treatment provides significant improvement. There is no history of kidney disease, CAD/MI or CVA.    Review of Systems  Constitutional:  Negative for appetite change, fatigue, fever and unexpected weight change.  Eyes:  Negative for visual disturbance.  Respiratory:  Negative for cough, chest tightness, shortness of breath and wheezing.   Cardiovascular:  Negative for chest pain, palpitations and leg swelling.  Gastrointestinal:  Negative for abdominal pain, constipation and diarrhea.  Endocrine: Negative for polydipsia and polyuria.  Genitourinary:  Negative for dysuria and hematuria.  Musculoskeletal:  Positive for arthralgias and gait problem (knees OA bone on bone). Negative for myalgias.  Skin:  Negative for wound.  Neurological:  Negative for dizziness, tremors, weakness, light-headedness, numbness and headaches.  Psychiatric/Behavioral:  Negative for dysphoric mood and sleep disturbance. The patient is not nervous/anxious.      Lab Results  Component Value Date   NA 141 09/26/2022   K 5.0 09/26/2022   CO2 26 09/26/2022   GLUCOSE  117 (H) 09/26/2022   BUN 19 09/26/2022   CREATININE 0.63 09/26/2022   CALCIUM 10.2 09/26/2022   EGFR 99 09/26/2022   GFRNONAA >60 06/15/2022   Lab Results  Component Value Date   CHOL 167 09/26/2022   HDL 63 09/26/2022   LDLCALC 82 09/26/2022   TRIG 125 09/26/2022   CHOLHDL 2.7 09/26/2022   Lab Results  Component Value Date   TSH 0.814 09/26/2022   Lab Results  Component Value Date   HGBA1C 6.1 (A) 05/18/2023   Lab Results  Component Value Date   WBC 7.4 09/26/2022   HGB 14.0 09/26/2022   HCT 44.4 09/26/2022   MCV 86 09/26/2022   PLT 279 09/26/2022   Lab Results  Component Value Date   ALT 18 09/26/2022   AST 19 09/26/2022   ALKPHOS 119 09/26/2022   BILITOT 0.4 09/26/2022   No results found for: "25OHVITD2", "25OHVITD3", "VD25OH"   Patient Active Problem List   Diagnosis Date Noted   Hypokalemia 06/12/2022   Obesity (BMI 30-39.9) 06/11/2022   Bilateral primary osteoarthritis of knee 05/12/2022   Polyp of sigmoid colon    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   Tinnitus aurium, 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   Nephrolithiasis 01/23/2017   Microscopic hematuria 08/22/2016   Type II diabetes mellitus with complication (HCC) 06/06/2014   Primary hypertension 06/06/2014   Hyperlipidemia associated with type 2 diabetes mellitus (HCC) 06/06/2014   Persistent proteinuria associated with type 2 diabetes mellitus (HCC) 06/06/2014   Status post parathyroidectomy 05/29/2011    Allergies  Allergen Reactions   Ace Inhibitors     Cough   Latex     Latex bandages cause a rash, paper tape is ok   Tape     Paper tape ok  Past Surgical History:  Procedure Laterality Date   COLONOSCOPY WITH PROPOFOL N/A 02/14/2021   Procedure: COLONOSCOPY WITH PROPOFOL;  Surgeon: Toney Reil, MD;  Location[facility] ENDOSCOPY;  Service: Gastroenterology;  Laterality: N/A;   IR NEPHROSTOMY PLACEMENT LEFT  01/23/2017   KIDNEY STONE SURGERY Left 03/13/2005   NEPHROLITHOTOMY Left 01/23/2017   Procedure:  NEPHROLITHOTOMY PERCUTANEOUS;  Surgeon: Riki Altes, MD;  Location: [facility] ORS;  Service: Urology;  Laterality: Left;   PARATHYROIDECTOMY  08/11/2011   [facility]   TONSILLECTOMY      Social History   Tobacco Use   Smoking status: Never   Smokeless tobacco: Never  Vaping Use   Vaping status: Never Used  Substance Use Topics   Alcohol use: Yes    Comment: occ   Drug use: No     Medication list has been reviewed and updated.  Current Meds  Medication Sig   atorvastatin (LIPITOR) 20 MG tablet TAKE 1 TABLET DAILY   Cholecalciferol 50 MCG (2000 UT) CAPS Vitamin D3 50 mcg (2,000 unit) capsule   GLUCOSAMINE HCL-MSM PO Take 1-2 tablets by mouth 2 (two) times daily. Take 2 tablets (1000mg ) in the morning and 1 tablet (500mg ) in the evening   glucose blood (ONE TOUCH ULTRA TEST) test strip Use to test blood sugar daily   JARDIANCE 25 MG TABS tablet TAKE 1 TABLET DAILY BEFORE BREAKFAST   losartan (COZAAR) 50 MG tablet TAKE 1 TABLET DAILY   metFORMIN (GLUCOPHAGE) 500 MG tablet TAKE 1 TABLET 3 TIMES A DAY   MULTIPLE VITAMIN PO Take 1 tablet by mouth daily.   OneTouch Delica Lancets 33G MISC Use 1 each 2 (two) times daily to test blood sugar. Extra fine   potassium citrate (UROCIT-K) 10 MEQ (1080 MG) SR tablet Take 2 tablets (20 mEq total) by mouth daily.   Probiotic Product (UP4 PROBIOTICS ADULT PO) Take by mouth.       05/18/2023    3:58 PM 01/26/2023    3:54 PM 09/26/2022    8:41 AM 06/24/2022    3:45 PM  GAD 7 : Generalized Anxiety Score  Nervous, Anxious, on Edge 0 0 1 0  Control/stop worrying 0 0 0 0  Worry too much - different things 0 0 1 0  Trouble relaxing 0 0 1 0  Restless 0 0 0 0  Easily annoyed or irritable 0 0 1 0  Afraid - awful might happen 0 0 0 0  Total GAD 7 Score 0 0 4 0  Anxiety Difficulty Not difficult at all Not difficult at all Not difficult at all Not difficult at all       05/18/2023    3:58 PM 01/26/2023    3:54 PM 09/26/2022    8:41 AM  Depression  screen PHQ 2/9  Decreased Interest 1 1 0  Down, Depressed, Hopeless 0 0 0  PHQ - 2 Score 1 1 0  Altered sleeping 2 2 0  Tired, decreased energy 1 1 1   Change in appetite 0 0 1  Feeling bad or failure about yourself   0 0  Trouble concentrating 0 0 0  Moving slowly or fidgety/restless 0 0 0  Suicidal thoughts 0 0 0  PHQ-9 Score 4 4 2   Difficult doing work/chores Not difficult at all Not difficult at all Not difficult at all    BP Readings from Last 3 Encounters:  05/18/23 110/64  01/26/23 122/68  09/26/22 116/64    Physical Exam Vitals and nursing note  reviewed.  Constitutional:      General: She is not in acute distress.    Appearance: Normal appearance. She is well-developed.  HENT:     Head: Normocephalic and atraumatic.  Cardiovascular:     Rate and Rhythm: Normal rate and regular rhythm.  Pulmonary:     Effort: Pulmonary effort is normal. No respiratory distress.     Breath sounds: No wheezing or rhonchi.  Musculoskeletal:     Right knee: Bony tenderness present. No swelling, effusion or crepitus. Decreased range of motion.     Left knee: Bony tenderness present. No swelling, effusion or crepitus. Decreased range of motion.     Right lower leg: No edema.     Left lower leg: No edema.  Lymphadenopathy:     Cervical: No cervical adenopathy.  Skin:    General: Skin is warm and dry.     Findings: No rash.  Neurological:     General: No focal deficit present.     Mental Status: She is alert and oriented to person, place, and time.  Psychiatric:        Mood and Affect: Mood normal.        Behavior: Behavior normal.     Wt Readings from Last 3 Encounters:  05/18/23 190 lb 2 oz (86.2 kg)  01/26/23 187 lb (84.8 kg)  09/26/22 182 lb 12.8 oz (82.9 kg)    BP 110/64   Pulse 78   Ht 5' (1.524 m)   Wt 190 lb 2 oz (86.2 kg)   SpO2 97%   BMI 37.13 kg/m   Assessment and Plan:  Problem List Items Addressed This Visit       Unprioritized   Type II diabetes  mellitus with complication (HCC) - Primary (Chronic)   Blood sugars have been stable.  No recent hypoglycemic events requiring assistance. Currently medications are jardiance and MTF.  A1C improved today from 6.5. Lab Results  Component Value Date   HGBA1C 6.1 (A) 05/18/2023   Last visit no changes were made; will continue current regimen.       Relevant Orders   POCT glycosylated hemoglobin (Hb A1C) (Completed)   Primary hypertension   Blood pressure is well controlled.  Current medications losartan Will continue same regimen along with efforts to limit dietary sodium.       Bilateral primary osteoarthritis of knee   Doing fairly well - postponed TKA until after retirement in 1.5 years Plans to start some pool exercise in lieu of walking. Will continue glucosamine      Other Visit Diagnoses       Long term current use of oral hypoglycemic drug           Return in about 4 months (around 09/17/2023) for CPX.    Reubin Milan, MD [facility] [place]

## 2023-05-27 ENCOUNTER — Ambulatory Visit: Payer: Self-pay | Admitting: Internal Medicine

## 2023-05-31 ENCOUNTER — Ambulatory Visit
Admission: EM | Admit: 2023-05-31 | Discharge: 2023-05-31 | Disposition: A | Discharge status: Home / Self Care | Attending: Emergency Medicine | Admitting: Emergency Medicine

## 2023-05-31 ENCOUNTER — Other Ambulatory Visit: Payer: Self-pay

## 2023-05-31 ENCOUNTER — Emergency Department

## 2023-05-31 ENCOUNTER — Emergency Department
Admission: EM | Admit: 2023-05-31 | Discharge: 2023-05-31 | Disposition: A | Discharge status: Home / Self Care | Attending: Emergency Medicine | Admitting: Emergency Medicine

## 2023-05-31 DIAGNOSIS — R1011 Right upper quadrant pain: Secondary | ICD-10-CM | POA: Diagnosis not present

## 2023-05-31 DIAGNOSIS — R079 Chest pain, unspecified: Secondary | ICD-10-CM

## 2023-05-31 DIAGNOSIS — J181 Lobar pneumonia, unspecified organism: Secondary | ICD-10-CM | POA: Insufficient documentation

## 2023-05-31 DIAGNOSIS — R9431 Abnormal electrocardiogram [ECG] [EKG]: Secondary | ICD-10-CM

## 2023-05-31 DIAGNOSIS — R509 Fever, unspecified: Secondary | ICD-10-CM

## 2023-05-31 DIAGNOSIS — D72829 Elevated white blood cell count, unspecified: Secondary | ICD-10-CM | POA: Diagnosis not present

## 2023-05-31 DIAGNOSIS — R911 Solitary pulmonary nodule: Secondary | ICD-10-CM | POA: Insufficient documentation

## 2023-05-31 DIAGNOSIS — I10 Essential (primary) hypertension: Secondary | ICD-10-CM | POA: Insufficient documentation

## 2023-05-31 DIAGNOSIS — E119 Type 2 diabetes mellitus without complications: Secondary | ICD-10-CM | POA: Diagnosis not present

## 2023-05-31 DIAGNOSIS — J189 Pneumonia, unspecified organism: Secondary | ICD-10-CM

## 2023-05-31 LAB — CBC
HCT: 36.9 % (ref 36.0–46.0)
Hemoglobin: 12.3 g/dL (ref 12.0–15.0)
MCH: 28.5 pg (ref 26.0–34.0)
MCHC: 33.3 g/dL (ref 30.0–36.0)
MCV: 85.4 fL (ref 80.0–100.0)
Platelets: 271 10*3/uL (ref 150–400)
RBC: 4.32 MIL/uL (ref 3.87–5.11)
RDW: 13.4 % (ref 11.5–15.5)
WBC: 11.5 10*3/uL — ABNORMAL HIGH (ref 4.0–10.5)
nRBC: 0 % (ref 0.0–0.2)

## 2023-05-31 LAB — URINALYSIS, ROUTINE W REFLEX MICROSCOPIC
Bacteria, UA: NONE SEEN
Bilirubin Urine: NEGATIVE
Glucose, UA: >=500 mg/dL — AB
Ketones, ur: 20 mg/dL — AB
Leukocytes,Ua: NEGATIVE
Nitrite: NEGATIVE
Protein, ur: 30 mg/dL — AB
Specific Gravity, Urine: 1.026 (ref 1.005–1.030)
Squamous Epithelial / HPF: 0 /HPF (ref 0–5)
pH: 5 (ref 5.0–8.0)

## 2023-05-31 LAB — COMPREHENSIVE METABOLIC PANEL WITH GFR
ALT: 22 U/L (ref 0–44)
AST: 22 U/L (ref 15–41)
Albumin: 3.7 g/dL (ref 3.5–5.0)
Alkaline Phosphatase: 77 U/L (ref 38–126)
Anion gap: 11 (ref 5–15)
BUN: 11 mg/dL (ref 8–23)
CO2: 22 mmol/L (ref 22–32)
Calcium: 9 mg/dL (ref 8.9–10.3)
Chloride: 98 mmol/L (ref 98–111)
Creatinine, Ser: 0.6 mg/dL (ref 0.44–1.00)
GFR, Estimated: >60 mL/min (ref >60)
Glucose, Bld: 137 mg/dL — ABNORMAL HIGH (ref 70–99)
Potassium: 3.5 mmol/L (ref 3.5–5.1)
Sodium: 131 mmol/L — ABNORMAL LOW (ref 135–145)
Total Bilirubin: 0.8 mg/dL (ref 0.0–1.2)
Total Protein: 7.6 g/dL (ref 6.5–8.1)

## 2023-05-31 LAB — TROPONIN I (HIGH SENSITIVITY): Troponin I (High Sensitivity): 12 ng/L (ref <18)

## 2023-05-31 LAB — POC SARS CORONAVIRUS 2 AG -  ED: SARS Coronavirus 2 Ag: NEGATIVE

## 2023-05-31 LAB — LIPASE, BLOOD: Lipase: 26 U/L (ref 11–51)

## 2023-05-31 MED ORDER — AZITHROMYCIN 250 MG PO TABS: ORAL_TABLET | ORAL | 0 refills | Status: AC

## 2023-05-31 MED ORDER — SODIUM CHLORIDE 0.9 % IV SOLN
1.0000 g | Freq: Once | INTRAVENOUS | Status: AC
  Administered 2023-05-31: 1 g via INTRAVENOUS
  Filled 2023-05-31: qty 10

## 2023-05-31 MED ORDER — AZITHROMYCIN 500 MG IV SOLR
500.0000 mg | Freq: Once | INTRAVENOUS | Status: AC
  Administered 2023-05-31: 500 mg via INTRAVENOUS
  Filled 2023-05-31: qty 5

## 2023-05-31 MED ORDER — IOHEXOL 350 MG/ML SOLN
75.0000 mL | Freq: Once | INTRAVENOUS | Status: AC | PRN
  Administered 2023-05-31: 53 mL via INTRAVENOUS

## 2023-05-31 MED ORDER — ACETAMINOPHEN 325 MG PO TABS
650.0000 mg | ORAL_TABLET | Freq: Once | ORAL | Status: AC
  Administered 2023-05-31: 650 mg via ORAL
  Filled 2023-05-31: qty 2

## 2023-05-31 MED ORDER — GUAIFENESIN-CODEINE 100-10 MG/5ML PO SOLN: 5.0000 mL | Freq: Four times a day (QID) | ORAL | 0 refills | Status: DC | PRN

## 2023-05-31 MED ORDER — ASPIRIN 81 MG PO CHEW
324.0000 mg | CHEWABLE_TABLET | Freq: Once | ORAL | Status: AC
  Administered 2023-05-31: 324 mg via ORAL

## 2023-05-31 MED ORDER — ACETAMINOPHEN 325 MG PO TABS
650.0000 mg | ORAL_TABLET | Freq: Once | ORAL | Status: AC
  Administered 2023-05-31: 650 mg via ORAL

## 2023-05-31 MED ORDER — CEPHALEXIN 500 MG PO CAPS: 500.0000 mg | ORAL_CAPSULE | Freq: Two times a day (BID) | ORAL | 0 refills | Status: DC

## 2023-05-31 NOTE — ED Notes (Signed)
 EMS at bedside

## 2023-05-31 NOTE — ED Provider Notes (Signed)
 Arlander Bellman    CSN: [id number] Arrival date & time: 05/31/23  4098      History   Chief Complaint Chief Complaint  Patient presents with   Fever   Generalized Body Aches    HPI Brittany Forbes is a 63 y.o. female.  Patient presents with 2-day history of fever, body aches, intermittent right side chest pain that makes it hard to take a  deep breath.  The right side chest pain is located under her right breast, comes and goes, feels sharp, radiates to her back, lasts about an hour then stops spontaneously; none currently.  No aggravating or alleviating factors noted.  No trauma.  No OTC medication taken today; took Tylenol  yesterday.  Patient denies congestion, cough, shortness of breath, abdominal pain, vomiting, diarrhea, constipation, dysuria.  Her medical history includes sinus tachycardia noted on EKG on 06/11/2022 and 06/12/2022.  Her medical history also includes diabetes, hypertension, hyperlipidemia.  The history is provided by the patient and medical records.    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   Tinnitus aurium, 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   Nephrolithiasis 01/23/2017   Microscopic hematuria 08/22/2016   Type II diabetes mellitus with complication (HCC) 06/06/2014   Primary hypertension 06/06/2014   Hyperlipidemia associated with type 2 diabetes mellitus (HCC) 06/06/2014   Persistent  proteinuria associated with type 2 diabetes mellitus (HCC) 06/06/2014   Status post parathyroidectomy 05/29/2011    Past Surgical History:  Procedure Laterality Date   COLONOSCOPY WITH PROPOFOL  N/A 02/14/2021   Procedure: COLONOSCOPY WITH PROPOFOL ;  Surgeon: Selena Daily, MD;  Location: [facility] ENDOSCOPY;  Service: Gastroenterology;  Laterality: N/A;   IR NEPHROSTOMY PLACEMENT LEFT  01/23/2017   KIDNEY STONE SURGERY Left 03/13/2005   NEPHROLITHOTOMY Left 01/23/2017   Procedure: NEPHROLITHOTOMY PERCUTANEOUS;  Surgeon: Geraline Knapp, MD;  Location: [facility] ORS;  Service: Urology;  Laterality: Left;   PARATHYROIDECTOMY  08/11/2011   [facility]   TONSILLECTOMY      OB History   No obstetric history on file.      Home Medications    Prior to Admission medications   Medication Sig Start Date End Date Taking? Authorizing Provider  atorvastatin  (LIPITOR) 20 MG tablet TAKE 1 TABLET DAILY 03/10/23   Sheron Dixons, MD  Cholecalciferol  50 MCG (2000 UT) CAPS Vitamin D3 50 mcg (2,000 unit) capsule    [provider]  GLUCOSAMINE HCL-MSM PO Take 1-2 tablets by mouth 2 (two) times daily. Take 2 tablets (1000mg ) in the morning and 1 tablet (500mg ) in the evening    [provider]  glucose blood (ONE TOUCH ULTRA TEST) test strip Use to test blood sugar daily 05/12/22   Sheron Dixons, MD  JARDIANCE  25 MG TABS tablet TAKE 1 TABLET DAILY BEFORE BREAKFAST 01/30/23   Sheron Dixons, MD  losartan  (COZAAR ) 50 MG tablet TAKE 1 TABLET DAILY 03/30/23   Berglund, Laura H, MD  metFORMIN  (GLUCOPHAGE ) 500 MG tablet TAKE 1 TABLET 3 TIMES A DAY 03/30/23   Sheron Dixons, MD  MULTIPLE VITAMIN PO Take 1 tablet by mouth daily.    [provider]  OneTouch Delica Lancets 33G MISC Use 1 each 2 (two) times daily to test blood sugar. Extra fine 09/24/21   Sheron Dixons, MD  potassium citrate  (UROCIT-K ) 10 MEQ (1080 MG) SR tablet Take 2 tablets (20 mEq total) by mouth daily. 05/10/23    Sheron Dixons, MD  Probiotic Product (UP4 PROBIOTICS ADULT PO) Take by mouth.    [provider]    Family History Family History  Problem Relation Age of Onset   Cancer Mother        lung   Diabetes Father    Heart disease Father    Breast cancer Maternal Aunt 48   Kidney cancer Neg Hx    Bladder Cancer Neg Hx     Social History Social History   Tobacco Use   Smoking status: Never   Smokeless tobacco: Never  Vaping Use   Vaping status: Never Used  Substance Use Topics   Alcohol use: Yes    Comment: occ   Drug use: No     Allergies   Ace inhibitors, Latex, and Tape   Review of Systems Review of Systems  Constitutional:  Positive for fever. Negative for chills.  HENT:  Negative for congestion, ear pain and sore throat.   Respiratory:  Negative for cough and shortness of breath.   Cardiovascular:  Positive for chest pain. Negative for palpitations.  Gastrointestinal:  Negative for abdominal pain, blood in stool, constipation, diarrhea, nausea and vomiting.  Genitourinary:  Negative for dysuria and hematuria.  Musculoskeletal:  Positive for back pain. Negative for gait problem.  Skin:  Negative for color change, rash and wound.  Neurological:  Negative for dizziness, weakness and numbness.     Physical Exam Triage Vital Signs ED Triage Vitals  Encounter Vitals Group     BP      Systolic BP Percentile      Diastolic BP Percentile      Pulse      Resp      Temp      Temp src      SpO2      Weight      Height      Head Circumference      Peak Flow      Pain Score      Pain Loc      Pain Education      Exclude from Growth Chart    No data found.  Updated Vital Signs BP 127/79   Pulse (!) 107   Temp (!) 101.5 F (38.6 C)   Resp 18   SpO2 95%   Visual Acuity Right Eye Distance:   Left Eye Distance:   Bilateral Distance:    Right Eye Near:   Left Eye Near:    Bilateral Near:     Physical Exam Constitutional:      General:  She is not in acute distress. HENT:     Right Ear: Tympanic membrane normal.     Left Ear: Tympanic membrane normal.     Nose: Nose normal.     Mouth/Throat:     Mouth: Mucous membranes are moist.     Pharynx: Oropharynx is clear.  Cardiovascular:     Rate and Rhythm: Regular rhythm. Tachycardia present.     Heart sounds: Normal heart sounds.  Pulmonary:     Effort: Pulmonary effort is normal. No respiratory distress.     Breath sounds: Normal breath sounds.  Abdominal:     General: Bowel sounds are normal. There is no distension.     Tenderness: There is no abdominal tenderness. There is no right CVA tenderness, left CVA tenderness, guarding or rebound.  Musculoskeletal:        General: No swelling, tenderness or deformity. Normal range of motion.  Skin:    General: Skin is warm and dry.     Findings: No bruising, erythema, lesion or rash.  Neurological:     General: No focal deficit present.     Mental Status: She is alert and oriented to person, place, and time.     Sensory: No sensory deficit.     Motor: No weakness.     Gait: Gait normal.      UC Treatments / Results  Labs (all labs ordered are listed, but only abnormal results are displayed) Labs Reviewed  POC SARS CORONAVIRUS 2 AG -  ED    EKG   Radiology No results found.  Procedures Procedures (including critical care time)  Medications Ordered in UC Medications  acetaminophen  (TYLENOL ) tablet 650 mg (650 mg Oral Given 05/31/23 0927)    Initial Impression / Assessment and Plan / UC Course  I have reviewed the triage vital signs and the nursing notes.  Pertinent labs & imaging results that were available during my care of the patient were reviewed by me and considered in my medical decision making (see chart for details).   Chest pain, abnormal EKG, fever.  Patient has intermittent right anterior chest pain under her right breast x 2 days.  Temp 100.9.  Tylenol  given here.  Rapid COVID-negative.  EKG  shows sinus tachycardia, rate 106, right bundle branch block, compared to previous from 06/12/2023.  325 chewable aspirin  given here.  Sending to ED via EMS.    Final Clinical Impressions(s) / UC Diagnoses   Final diagnoses:  Chest pain, unspecified type  Nonspecific abnormal electrocardiogram (ECG) (EKG)  Fever, unspecified   Discharge Instructions   None     ED Prescriptions   None    PDMP not reviewed this encounter.   Wellington Half, NP 05/31/23 1002

## 2023-05-31 NOTE — Discharge Instructions (Addendum)
 Take antibiotics as directed.  Follow with your primary care doctor for further evaluation.  Return to the ER for new or worsening symptoms.  As we discussed, your CT scan did show a small pulmonary nodule.  Please discuss this further with your primary care doctor at your next visit.  I have included a copy of your radiology report below.   CLINICAL DATA:  Right upper quadrant pain, abnormal EKG, fever    EXAM:  CT ANGIOGRAPHY CHEST WITH CONTRAST    TECHNIQUE:  Multidetector CT imaging of the chest was performed using the  standard protocol during bolus administration of intravenous  contrast. Multiplanar CT image reconstructions and MIPs were  obtained to evaluate the vascular anatomy.    RADIATION DOSE REDUCTION: This exam was performed according to the  departmental dose-optimization program which includes automated  exposure control, adjustment of the mA and/or kV according to  patient size and/or use of iterative reconstruction technique.    CONTRAST:  53mL OMNIPAQUE  IOHEXOL  350 MG/ML SOLN    COMPARISON:  CT abdomen 01/01/2018    FINDINGS:  Cardiovascular: Heart size normal. No pericardial effusion.  Satisfactory opacification of pulmonary arteries noted, and there is  no evidence of pulmonary emboli. Mitral annulus calcifications. LAD  coronary calcifications. Adequate contrast opacification of the  thoracic aorta with no evidence of dissection, aneurysm, or  stenosis. There is classic 3-vessel brachiocephalic arch anatomy  without proximal stenosis. Minimal calcified plaque in the  descending thoracic aorta.    Mediastinum/Nodes: Small hiatal hernia. Subcentimeter right  paratracheal and precarinal lymph nodes no hilar adenopathy.    Lungs/Pleura: No pleural effusion. No pneumothorax. Extensive  nodular airspace and ground-glass opacities throughout the right  middle lobe. 4 mm noncalcified nodule in the left upper lobe  (Im34,Se5)    Upper Abdomen: Peripheral left  renal calculus at least 6 mm, without  hydronephrosis. No acute findings.    Musculoskeletal: Chronic moderate T12 vertebral compression  deformity. Vertebral endplate spurring at multiple levels in the  lower thoracic spine.    Review of the MIP images confirms the above findings.    IMPRESSION:  1. Negative for acute PE or thoracic aortic dissection.  2. Extensive nodular airspace and ground-glass opacities throughout  the right middle lobe, consistent with pneumonia.  3. 4 mm left upper lobe nodule.  4. Nonobstructing left nephrolithiasis.  5.  Aortic Atherosclerosis (ICD10-I70.0).  The      Electronically Signed    By: Nicoletta Barrier M.D.    On: 05/31/2023 15:35

## 2023-05-31 NOTE — ED Notes (Signed)
 Patient is being discharged from the Urgent Care and sent to the Emergency Department via [facility] . Per Leanor Proper, patient is in need of higher level of care due to chest pain/ abnormal EKG. Patient is aware and verbalizes understanding of plan of care.  Vitals:   05/31/23 0920 05/31/23 0957  BP: 123/81 127/79  Pulse: (!) 109 (!) 107  Resp: 18 18  Temp: (!) 100.9 F (38.3 C) (!) 101.5 F (38.6 C)  SpO2: 95% 95%

## 2023-05-31 NOTE — ED Triage Notes (Signed)
 Pt to ED for abnormal EKG, sent from UC after going there for   RUQ pain last 2 nights ago that radiated to back along with fever (no pain now). Highest temp was 101.7. pt was given tylenol  PTA at [facility]. Denies N/V. Has GB. EKG shown to EDP Siadecki.

## 2023-05-31 NOTE — ED Notes (Signed)
 EMS contacted. Friend at bedside. Denies other needs.

## 2023-05-31 NOTE — ED Provider Notes (Signed)
 [facility] Provider Note    Event Date/Time   First MD Initiated Contact with Patient 05/31/23 1059     (approximate)  History   Chief Complaint: Abnormal ECG and Abdominal Pain  HPI  Ciarra Braddy is a 63 y.o. female with a past medical history of anemia, diabetes, gastric reflux, hypertension, hyperlipidemia, presents to the emergency department for right sided chest/abdominal pain as well as an abnormal EKG.  According to the patient over the last 2 days she has been experiencing pain in her right lower chest/right upper abdomen.  States the pain has been intermittent coming and going and has occurred 2 times per patient.  Denies any association with food or movement.  Patient states she had a fever to 102 yesterday, denies any nausea vomiting diarrhea urinary symptoms cough or congestion.  Patient went to urgent care today for evaluation she had a negative COVID swab done had an EKG showing some abnormal findings so the patient was sent to the emergency department for further workup.  Here the patient is awake alert no distress.  Denies any symptoms at this time.  Physical Exam   Triage Vital Signs: ED Triage Vitals  Encounter Vitals Group     BP 05/31/23 1042 137/78     Systolic BP Percentile --      Diastolic BP Percentile --      Pulse Rate 05/31/23 1042 (!) 104     Resp 05/31/23 1042 18     Temp 05/31/23 1042 98.7 F (37.1 C)     Temp Source 05/31/23 1042 Oral     SpO2 05/31/23 1042 96 %     Weight 05/31/23 1050 185 lb (83.9 kg)     Height 05/31/23 1050 5' (1.524 m)     Head Circumference --      Peak Flow --      Pain Score 05/31/23 1049 0     Pain Loc --      Pain Education --      Exclude from Growth Chart --     Most recent vital signs: Vitals:   05/31/23 1042  BP: 137/78  Pulse: (!) 104  Resp: 18  Temp: 98.7 F (37.1 C)  SpO2: 96%    General: Awake, no distress.  CV:  Good peripheral perfusion.  Regular rate and rhythm   Resp:  Normal effort.  Equal breath sounds bilaterally.  No chest wall tenderness. Abd:  No distention.  Soft, benign abdomen with no tenderness identified.  ED Results / Procedures / Treatments   EKG  EKG viewed and interpreted by myself shows a sinus rhythm at 96 bpm with a slightly widened QRS, normal axis, largely normal intervals.  Patient does have an RSR consistent with a right bundle branch block.  No concerning ST changes.  RADIOLOGY  I have reviewed the CT images patient peers to have a right middle lobe pneumonia.  Awaiting radiology confirmation.  I do not appreciate any large PE.   MEDICATIONS ORDERED IN ED: Medications - No data to display   IMPRESSION / MDM / ASSESSMENT AND PLAN / ED COURSE  I reviewed the triage vital signs and the nursing notes.  Patient's presentation is most consistent with acute presentation with potential threat to life or bodily function.  Patient presents emergency department for right upper quadrant chest pain/right lower chest pain.  Overall the patient appears well, no distress.  Patient states for the last 2 days the pain has come intermittently  as well as a reported fever yesterday.  Given the location of the pain I am concerned for possible gallbladder pathology.  No history of abdominal surgeries previously.  However reassuringly patient has no tenderness on exam to this area.  Patient is slightly tachycardic around 95 bpm currently.  Does state a slight pleuritic nature to the pain.  EKG shows what appears to be a right bundle branch block.  I have added on a cardiac enzyme as a precaution.  We will check labs including a CBC CMP and lipase.  If the patient's LFTs are elevated we will proceed with an ultrasound to further evaluate.  If LFTs are normal patient may require further workup such as a D-dimer or CT.  Currently patient denies any symptoms.  Patient's workup shows mild leukocytosis otherwise reassuring CBC, negative troponin normal  chemistry normal LFTs and lipase.  Urinalysis is normal.  Ultrasound appears to be negative for acute abnormality.  However I have reviewed the patient's CT scan due to her pleuritic pain, I do not appreciate any obvious PE however the patient does appear to have a right middle lobe pneumonia.  Will place patient on Rocephin  and Zithromax  while awaiting official radiology read.  We will likely discharge on oral antibiotics and have the patient follow-up with her doctor.  Patient agreeable to plan.  FINAL CLINICAL IMPRESSION(S) / ED DIAGNOSES   Community-acquired pneumonia   Note:  This document was prepared using Dragon voice recognition software and may include unintentional dictation errors.   Ruth Cove, MD 05/31/23 1500

## 2023-05-31 NOTE — ED Triage Notes (Addendum)
 Patient to Urgent Care with complaints of fevers/ body aches/ fatigue/ sharp right sided rib pain that radiates into her back. Reports some difficulty taking a deep breath. Has been power washing and attributed muscle pain to increased physical activity.   Symptoms started two days ago. Home Covid test negative (possibly expired).  Meds: (no otc today)/ taking tylenol .

## 2023-05-31 NOTE — ED Provider Notes (Signed)
 Care of this patient assumed from prior physician at 1500 pending CTA read and disposition. Please see prior physician note for further details.  Briefly this is a 63 year old female who presented with fever, right-sided lower chest/upper abdominal pain.  Labs with mild leukocytosis, EKG without acute ischemic findings.  Negative troponin.  RUQ US  without acute findings.  CTA chest read pending at time of signout, noted to show likely right middle pneumonia by prior physician.  If confirmed by radiology, anticipated the patient would be stable for discharge.  CTA of the chest was without evidence of PE, did demonstrate findings concerning for right middle lobe pneumonia.  An incidental left-sided pulmonary nodule was noted, patient updated on this and will follow-up with her primary care doctor.  On reevaluation, patient reports feeling improved.  Her heart rate is in the 80s, O2 sat of 99% with unlabored respirations.  She would like to be discharged home which I do think is reasonable.  Antibiotics have been sent by prior provider.  Strict return precautions provided.  Patient discharged stable condition.   Claria Crofts, MD 05/31/23 [id number]

## 2023-06-02 ENCOUNTER — Encounter: Payer: Self-pay | Admitting: Internal Medicine

## 2023-06-02 ENCOUNTER — Ambulatory Visit: Admitting: Internal Medicine

## 2023-06-02 VITALS — BP 112/60 | HR 99 | Ht 60.0 in | Wt 187.0 lb

## 2023-06-02 DIAGNOSIS — I4510 Unspecified right bundle-branch block: Secondary | ICD-10-CM | POA: Diagnosis not present

## 2023-06-02 DIAGNOSIS — R911 Solitary pulmonary nodule: Secondary | ICD-10-CM | POA: Insufficient documentation

## 2023-06-02 DIAGNOSIS — J189 Pneumonia, unspecified organism: Secondary | ICD-10-CM

## 2023-06-02 NOTE — Assessment & Plan Note (Addendum)
 RBBB on EKG done this week - was present in 2024 Will refer to Cardiology at next visit

## 2023-06-02 NOTE — Progress Notes (Signed)
 Date:  06/02/2023   Name:  Brittany Forbes   DOB:  [birth year]   MRN:  [id number]   Chief Complaint: Hospitalization Follow-up ER follow up from 05/31/23.  Diagnosed with RML pneumonia and a 4 mm LUL nodule.  Discharged home on Zapk and Keflex .  She denies cough or shortness of breath.  Chills and fever have resolved.  Right sided chest pain off and on. Also noted to have RBBB present since 2024 but not in 2018.  CTA: IMPRESSION: 1. Negative for acute PE or thoracic aortic dissection. 2. Extensive nodular airspace and ground-glass opacities throughout the right middle lobe, consistent with pneumonia. 3. 4 mm left upper lobe nodule. 4. Nonobstructing left nephrolithiasis. 5.  Aortic Atherosclerosis (ICD10-I70.0).  HPI  Review of Systems  Constitutional:  Positive for fatigue. Negative for chills and fever.  HENT:  Negative for sore throat.   Respiratory:  Negative for cough, chest tightness, shortness of breath and wheezing.   Cardiovascular:  Positive for chest pain (right sided intermittent).  Gastrointestinal:  Negative for abdominal pain, diarrhea, nausea and vomiting.  Psychiatric/Behavioral:  Negative for dysphoric mood and sleep disturbance. The patient is not nervous/anxious.      Lab Results  Component Value Date   NA 131 (L) 05/31/2023   K 3.5 05/31/2023   CO2 22 05/31/2023   GLUCOSE 137 (H) 05/31/2023   BUN 11 05/31/2023   CREATININE 0.60 05/31/2023   CALCIUM  9.0 05/31/2023   EGFR 99 09/26/2022   GFRNONAA >60 05/31/2023   Lab Results  Component Value Date   CHOL 167 09/26/2022   HDL 63 09/26/2022   LDLCALC 82 09/26/2022   TRIG 125 09/26/2022   CHOLHDL 2.7 09/26/2022   Lab Results  Component Value Date   TSH 0.814 09/26/2022   Lab Results  Component Value Date   HGBA1C 6.1 (A) 05/18/2023   Lab Results  Component Value Date   WBC 11.5 (H) 05/31/2023   HGB 12.3 05/31/2023   HCT 36.9 05/31/2023   MCV 85.4 05/31/2023   PLT 271 05/31/2023   Lab  Results  Component Value Date   ALT 22 05/31/2023   AST 22 05/31/2023   ALKPHOS 77 05/31/2023   BILITOT 0.8 05/31/2023   No results found for: "25OHVITD2", "25OHVITD3", "VD25OH"   Patient Active Problem List   Diagnosis Date Noted   Pulmonary nodule, left 06/02/2023   RBBB (right bundle branch block) 06/02/2023   Hypokalemia 06/12/2022   Obesity (BMI 30-39.9) 06/11/2022   Bilateral primary osteoarthritis of knee 05/12/2022   Polyp of sigmoid colon    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   Tinnitus aurium, 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   Nephrolithiasis 01/23/2017   Microscopic hematuria 08/22/2016   Type II diabetes mellitus with complication (HCC) 06/06/2014   Primary hypertension 06/06/2014   Hyperlipidemia associated with type 2 diabetes mellitus (HCC) 06/06/2014   Persistent proteinuria associated with type 2 diabetes mellitus (HCC) 06/06/2014   Status post parathyroidectomy 05/29/2011    Allergies  Allergen Reactions   Ace Inhibitors     Cough   Latex     Latex bandages cause a rash, paper tape is ok   Tape     Paper tape ok    Past Surgical History:  Procedure Laterality Date   COLONOSCOPY WITH PROPOFOL  N/A 02/14/2021   Procedure: COLONOSCOPY WITH PROPOFOL ;  Surgeon: Selena Daily, MD;  Location: [facility] ENDOSCOPY;  Service: Gastroenterology;  Laterality: N/A;   IR NEPHROSTOMY PLACEMENT LEFT  01/23/2017   KIDNEY STONE SURGERY Left  03/13/2005   NEPHROLITHOTOMY Left 01/23/2017   Procedure: NEPHROLITHOTOMY PERCUTANEOUS;  Surgeon: Geraline Knapp, MD;  Location: [facility] ORS;  Service: Urology;  Laterality: Left;   PARATHYROIDECTOMY  08/11/2011   [facility]   TONSILLECTOMY      Social History   Tobacco Use   Smoking status: Never   Smokeless tobacco: Never  Vaping Use   Vaping status: Never Used  Substance Use Topics   Alcohol use: Yes    Comment: occ   Drug use: No     Medication list has been reviewed and updated.  Current Meds  Medication Sig   atorvastatin  (LIPITOR) 20 MG  tablet TAKE 1 TABLET DAILY   azithromycin  (ZITHROMAX  Z-PAK) 250 MG tablet Take 2 tablets (500 mg) on  Day 1,  followed by 1 tablet (250 mg) once daily on Days 2 through 5.   cephALEXin  (KEFLEX ) 500 MG capsule Take 1 capsule (500 mg total) by mouth 2 (two) times daily.   Cholecalciferol  50 MCG (2000 UT) CAPS Vitamin D3 50 mcg (2,000 unit) capsule   GLUCOSAMINE HCL-MSM PO Take 1-2 tablets by mouth 2 (two) times daily. Take 2 tablets (1000mg ) in the morning and 1 tablet (500mg ) in the evening   glucose blood (ONE TOUCH ULTRA TEST) test strip Use to test blood sugar daily   JARDIANCE  25 MG TABS tablet TAKE 1 TABLET DAILY BEFORE BREAKFAST   losartan  (COZAAR ) 50 MG tablet TAKE 1 TABLET DAILY   metFORMIN  (GLUCOPHAGE ) 500 MG tablet TAKE 1 TABLET 3 TIMES A DAY   MULTIPLE VITAMIN PO Take 1 tablet by mouth daily.   OneTouch Delica Lancets 33G MISC Use 1 each 2 (two) times daily to test blood sugar. Extra fine   potassium citrate  (UROCIT-K ) 10 MEQ (1080 MG) SR tablet Take 2 tablets (20 mEq total) by mouth daily.   Probiotic Product (UP4 PROBIOTICS ADULT PO) Take by mouth.       06/02/2023    9:17 AM 05/18/2023    3:58 PM 01/26/2023    3:54 PM 09/26/2022    8:41 AM  GAD 7 : Generalized Anxiety Score  Nervous, Anxious, on Edge 0 0 0 1  Control/stop worrying 0 0 0 0  Worry too much - different things 0 0 0 1  Trouble relaxing 0 0 0 1  Restless 0 0 0 0  Easily annoyed or irritable 1 0 0 1  Afraid - awful might happen 0 0 0 0  Total GAD 7 Score 1 0 0 4  Anxiety Difficulty Not difficult at all Not difficult at all Not difficult at all Not difficult at all       06/02/2023    9:16 AM 05/18/2023    3:58 PM 01/26/2023    3:54 PM  Depression screen PHQ 2/9  Decreased Interest 1 1 1   Down, Depressed, Hopeless 0 0 0  PHQ - 2 Score 1 1 1   Altered sleeping 1 2 2   Tired, decreased energy 2 1 1   Change in appetite 1 0 0  Feeling bad or failure about yourself  0  0  Trouble concentrating 0 0 0  Moving  slowly or fidgety/restless 1 0 0  Suicidal thoughts 0 0 0  PHQ-9 Score 6 4 4   Difficult doing work/chores Somewhat difficult Not difficult at all Not difficult at all    BP Readings from Last 3 Encounters:  06/02/23 112/60  05/31/23 131/62  05/31/23 127/79    Physical Exam Vitals and nursing note reviewed.  Constitutional:      General: She is not in acute distress.    Appearance: Normal appearance. She is well-developed.  HENT:     Head: Normocephalic and atraumatic.  Pulmonary:     Effort: Pulmonary effort is normal. No respiratory distress.  Musculoskeletal:     Cervical back: Normal range of motion.  Skin:    General: Skin is warm and dry.     Findings: No rash.  Neurological:     Mental Status: She is alert and oriented to person, place, and time.  Psychiatric:        Mood and Affect: Mood normal.        Behavior: Behavior normal.     Wt Readings from Last 3 Encounters:  06/02/23 187 lb (84.8 kg)  05/31/23 185 lb (83.9 kg)  05/18/23 190 lb 2 oz (86.2 kg)    BP 112/60   Pulse 99   Ht 5' (1.524 m)   Wt 187 lb (84.8 kg)   SpO2 96%   BMI 36.52 kg/m   Assessment and Plan:  Problem List Items Addressed This Visit       Unprioritized   RBBB (right bundle branch block) (Chronic)   RBBB on EKG done this week - was present in 2024 Will refer to Cardiology at next visit        Pulmonary nodule, left   Will order CT nodule and PNA follow up for 3 months.      Relevant Orders   CT CHEST WO CONTRAST   Other Visit Diagnoses       Community acquired pneumonia of right middle lobe of lung    -  Primary   Recommend out of work thru this week complete antibiotics follow up if f/c recur   Relevant Orders   CT CHEST WO CONTRAST       No follow-ups on file.    Sheron Dixons, MD [facility] [place]

## 2023-06-02 NOTE — Assessment & Plan Note (Signed)
 Will order CT nodule and PNA follow up for 3 months.

## 2023-06-24 ENCOUNTER — Other Ambulatory Visit: Payer: Self-pay | Admitting: Internal Medicine

## 2023-06-24 DIAGNOSIS — N200 Calculus of kidney: Secondary | ICD-10-CM

## 2023-06-25 NOTE — Telephone Encounter (Signed)
 Requested Prescriptions  Pending Prescriptions Disp Refills   potassium citrate  (UROCIT-K ) 10 MEQ (1080 MG) SR tablet [Pharmacy Med Name: POTASSIUM CITRATE  ER 10 MEQ TB] 180 tablet 0    Sig: TAKE 2 TABLETS BY MOUTH DAILY     Endocrinology:  Minerals - Potassium Citrate  Failed - 06/25/2023  1:18 PM      Failed - Na in normal range and within 120 days    Sodium  Date Value Ref Range Status  05/31/2023 131 (L) 135 - 145 mmol/L Final  09/26/2022 141 134 - 144 mmol/L Final         Failed - WBC in normal range and within 120 days    WBC  Date Value Ref Range Status  05/31/2023 11.5 (H) 4.0 - 10.5 K/uL Final         Failed - Valid encounter within last 4 months    Recent Outpatient Visits           3 weeks ago Community acquired pneumonia of right middle lobe of lung   [place] [facility] at [facility], Chales [place], MD   1 month ago Type II diabetes mellitus with complication [facility])   [place] [facility] at [facility], Chales [place], MD       Future Appointments             In 3 months Gala Jubilee [place]ales [place], MD [facility] at [facility]            Passed - CO2 in normal range and within 120 days    CO2  Date Value Ref Range Status  05/31/2023 22 22 - 32 mmol/L Final         Passed - Cl in normal range and within 120 days    Chloride  Date Value Ref Range Status  05/31/2023 98 98 - 111 mmol/L Final         Passed - Cr in normal range and within 120 days    Creatinine, Ser  Date Value Ref Range Status  05/31/2023 0.60 0.44 - 1.00 mg/dL Final         Passed - K in normal range and within 120 days    Potassium  Date Value Ref Range Status  05/31/2023 3.5 3.5 - 5.1 mmol/L Final         Passed - HGB in normal range and within 120 days    Hemoglobin  Date Value Ref Range Status  05/31/2023 12.3 12.0 - 15.0 g/dL Final  16/11/9602 54.0 11.1 - 15.9 g/dL Final          Passed - HCT in normal range and within 120 days    HCT  Date Value Ref Range Status  05/31/2023 36.9 36.0 - 46.0 % Final   Hematocrit  Date Value Ref Range Status  09/26/2022 44.4 34.0 - 46.6 % Final         Passed - PLT in normal range and within 120 days    Platelets  Date Value Ref Range Status  05/31/2023 271 150 - 400 K/uL Final  09/26/2022 279 150 - 450 x10E3/uL Final         Passed - Urinalysis completed in last 4 months.    Specific Gravity, UA  Date Value Ref Range Status  01/30/2017 <1.005 (L) 1.005 - 1.030 Final   Spec Grav, UA  Date Value Ref Range Status  09/06/2020 >=  1.030 (A) 1.010 - 1.025 Final   Specific Gravity, Urine  Date Value Ref Range Status  05/31/2023 1.026 1.005 - 1.030 Final   Glucose, UA  Date Value Ref Range Status  05/31/2023 >=500 (A) NEGATIVE mg/dL Final  66/44/0347 Negative Negative Final   Urine Culture, Comprehensive  Date Value Ref Range Status  10/22/2016 Final report  Final   Urine Culture, Routine  Date Value Ref Range Status  09/06/2020 Final report  Final   Urobilinogen, UA  Date Value Ref Range Status  09/06/2020 0.2 0.2 or 1.0 E.U./dL Final   Protein, ur  Date Value Ref Range Status  05/31/2023 30 (A) NEGATIVE mg/dL Final   Total Protein  Date Value Ref Range Status  05/31/2023 7.6 6.5 - 8.1 g/dL Final  42/59/5638 6.8 6.0 - 8.5 g/dL Final   Nitrite  Date Value Ref Range Status  05/31/2023 NEGATIVE NEGATIVE Final   RBC, UA  Date Value Ref Range Status  01/30/2017 >30 (H) 0 - 2 /hpf Final   RBC / HPF  Date Value Ref Range Status  05/31/2023 0-5 0 - 5 RBC/hpf Final   WBC, UA  Date Value Ref Range Status  05/31/2023 11-20 0 - 5 WBC/hpf Final

## 2023-07-22 ENCOUNTER — Other Ambulatory Visit: Payer: Self-pay | Admitting: Internal Medicine

## 2023-07-22 DIAGNOSIS — E118 Type 2 diabetes mellitus with unspecified complications: Secondary | ICD-10-CM

## 2023-07-23 NOTE — Telephone Encounter (Signed)
 Requested Prescriptions  Pending Prescriptions Disp Refills   empagliflozin  (JARDIANCE ) 25 MG TABS tablet [Pharmacy Med Name: JARDIANCE  TAB 25MG ] 90 tablet 0    Sig: TAKE 1 TABLET DAILY BEFORE BREAKFAST     Endocrinology:  Diabetes - SGLT2 Inhibitors Failed - 07/23/2023  2:44 PM      Failed - Valid encounter within last 6 months    Recent Outpatient Visits           1 month ago Community acquired pneumonia of right middle lobe of lung   [place] [facility] at [facility], Chales [place], MD   2 months ago Type II diabetes mellitus with complication [facility])   [place] [facility] at [facility], Chales [place], MD       Future Appointments             In 2 months Gala Jubilee [place], MD [facility] at [facility], [facility]            Passed - Cr in normal range and within 360 days    Creatinine, Ser  Date Value Ref Range Status  05/31/2023 0.60 0.44 - 1.00 mg/dL Final         Passed - HBA1C is between 0 and 7.9 and within 180 days    Hemoglobin A1C  Date Value Ref Range Status  05/18/2023 6.1 (A) 4.0 - 5.6 % Final   Hgb A1c MFr Bld  Date Value Ref Range Status  09/26/2022 6.6 (H) 4.8 - 5.6 % Final    Comment:             Prediabetes: 5.7 - 6.4          Diabetes: >6.4          Glycemic control for adults with diabetes: <7.0          Passed - eGFR in normal range and within 360 days    GFR calc Af Amer  Date Value Ref Range Status  09/05/2019 117 >59 mL/min/1.73 Final    Comment:    **[facility] currently reports eGFR in compliance with the current**   recommendations of the [employer]. [facility] will   update reporting as new guidelines are published from the NKF-ASN   Task force.    GFR, Estimated  Date Value Ref Range Status  05/31/2023 >60 >60 mL/min Final    Comment:    (NOTE) Calculated using the CKD-EPI Creatinine Equation (2021)    eGFR  Date Value  Ref Range Status  09/26/2022 99 >59 mL/min/1.73 Final

## 2023-08-17 ENCOUNTER — Ambulatory Visit
Admission: RE | Admit: 2023-08-17 | Discharge: 2023-08-17 | Disposition: A | Discharge status: Home / Self Care | Source: Ambulatory Visit | Attending: Internal Medicine | Admitting: Internal Medicine

## 2023-08-17 DIAGNOSIS — J189 Pneumonia, unspecified organism: Secondary | ICD-10-CM | POA: Diagnosis present

## 2023-08-17 DIAGNOSIS — R911 Solitary pulmonary nodule: Secondary | ICD-10-CM | POA: Diagnosis present

## 2023-08-18 ENCOUNTER — Ambulatory Visit: Payer: Self-pay | Admitting: Internal Medicine

## 2023-08-18 DIAGNOSIS — I2510 Atherosclerotic heart disease of native coronary artery without angina pectoris: Secondary | ICD-10-CM

## 2023-08-18 MED ORDER — ROSUVASTATIN CALCIUM 20 MG PO TABS: 20.0000 mg | ORAL_TABLET | Freq: Every day | ORAL | 1 refills | Status: DC

## 2023-09-03 ENCOUNTER — Other Ambulatory Visit: Payer: Self-pay | Admitting: Internal Medicine

## 2023-09-04 NOTE — Telephone Encounter (Signed)
 Requested Prescriptions  Refused Prescriptions Disp Refills   atorvastatin  (LIPITOR) 20 MG tablet [Pharmacy Med Name: ATORVASTATIN  TAB 20MG ] 90 tablet 1    Sig: TAKE 1 TABLET DAILY     Cardiovascular:  Antilipid - Statins Failed - 09/04/2023 11:46 AM      Failed - Lipid Panel in normal range within the last 12 months    Cholesterol, Total  Date Value Ref Range Status  09/26/2022 167 100 - 199 mg/dL Final   LDL Chol Calc (NIH)  Date Value Ref Range Status  09/26/2022 82 0 - 99 mg/dL Final   HDL  Date Value Ref Range Status  09/26/2022 63 >39 mg/dL Final   Triglycerides  Date Value Ref Range Status  09/26/2022 125 0 - 149 mg/dL Final         Passed - Patient is not pregnant      Passed - Valid encounter within last 12 months    Recent Outpatient Visits           3 months ago Community acquired pneumonia of right middle lobe of lung   [place] [facility] at [facility], Leita DEL, MD   3 months ago Type II diabetes mellitus with complication [facility])   [place] [facility] at [facility], Leita DEL, MD       Future Appointments             In 3 weeks Justus, Leita DEL, MD [facility] at [facility], [facility]   In 1 month Darron, Deatrice LABOR, MD [facility] at [facility]

## 2023-09-25 ENCOUNTER — Other Ambulatory Visit: Payer: Self-pay | Admitting: Internal Medicine

## 2023-09-25 DIAGNOSIS — I10 Essential (primary) hypertension: Secondary | ICD-10-CM

## 2023-09-25 DIAGNOSIS — E1129 Type 2 diabetes mellitus with other diabetic kidney complication: Secondary | ICD-10-CM

## 2023-09-28 NOTE — Telephone Encounter (Signed)
 Requested Prescriptions  Pending Prescriptions Disp Refills   metFORMIN  (GLUCOPHAGE ) 500 MG tablet [Pharmacy Med Name: METFORMIN  TAB 500MG ] 270 tablet 0    Sig: TAKE 1 TABLET 3 TIMES A DAY     Endocrinology:  Diabetes - Biguanides Failed - 09/28/2023  4:58 PM      Failed - B12 Level in normal range and within 720 days    No results found for: VITAMINB12       Failed - CBC within normal limits and completed in the last 12 months    WBC  Date Value Ref Range Status  05/31/2023 11.5 (H) 4.0 - 10.5 K/uL Final   RBC  Date Value Ref Range Status  05/31/2023 4.32 3.87 - 5.11 MIL/uL Final   Hemoglobin  Date Value Ref Range Status  05/31/2023 12.3 12.0 - 15.0 g/dL Final  91/83/7975 85.9 11.1 - 15.9 g/dL Final   HCT  Date Value Ref Range Status  05/31/2023 36.9 36.0 - 46.0 % Final   Hematocrit  Date Value Ref Range Status  09/26/2022 44.4 34.0 - 46.6 % Final   MCHC  Date Value Ref Range Status  05/31/2023 33.3 30.0 - 36.0 g/dL Final   [facility]  Date Value Ref Range Status  05/31/2023 28.5 26.0 - 34.0 pg Final   MCV  Date Value Ref Range Status  05/31/2023 85.4 80.0 - 100.0 fL Final  09/26/2022 86 79 - 97 fL Final   No results found for: PLTCOUNTKUC, LABPLAT, POCPLA RDW  Date Value Ref Range Status  05/31/2023 13.4 11.5 - 15.5 % Final  09/26/2022 14.1 11.7 - 15.4 % Final         Passed - Cr in normal range and within 360 days    Creatinine, Ser  Date Value Ref Range Status  05/31/2023 0.60 0.44 - 1.00 mg/dL Final         Passed - HBA1C is between 0 and 7.9 and within 180 days    Hemoglobin A1C  Date Value Ref Range Status  05/18/2023 6.1 (A) 4.0 - 5.6 % Final   Hgb A1c MFr Bld  Date Value Ref Range Status  09/26/2022 6.6 (H) 4.8 - 5.6 % Final    Comment:             Prediabetes: 5.7 - 6.4          Diabetes: >6.4          Glycemic control for adults with diabetes: <7.0          Passed - eGFR in normal range and within 360 days    GFR calc Af Amer   Date Value Ref Range Status  09/05/2019 117 >59 mL/min/1.73 Final    Comment:    **[facility] currently reports eGFR in compliance with the current**   recommendations of the [employer]. [facility] will   update reporting as new guidelines are published from the NKF-ASN   Task force.    GFR, Estimated  Date Value Ref Range Status  05/31/2023 >60 >60 mL/min Final    Comment:    (NOTE) Calculated using the CKD-EPI Creatinine Equation (2021)    eGFR  Date Value Ref Range Status  09/26/2022 99 >59 mL/min/1.73 Final         Passed - Valid encounter within last 6 months    Recent Outpatient Visits           3 months ago Community acquired pneumonia of right middle lobe of lung   [place]  Primary Care & Sports Medicine at [facility], Leita DEL, MD   4 months ago Type II diabetes mellitus with complication [facility])   [place] [facility] Medicine at [facility], Leita DEL, MD       Future Appointments             Tomorrow Justus Leita DEL, MD [facility] Primary Care & [facility] Medicine at [facility], [facility]   In 3 weeks Darron Deatrice LABOR, MD [facility] HeartCare at [facility]             losartan  (COZAAR ) 50 MG tablet [Pharmacy Med Name: LOSARTAN  POT TAB 50MG ] 90 tablet 0    Sig: TAKE 1 TABLET DAILY     Cardiovascular:  Angiotensin Receptor Blockers Passed - 09/28/2023  4:58 PM      Passed - Cr in normal range and within 180 days    Creatinine, Ser  Date Value Ref Range Status  05/31/2023 0.60 0.44 - 1.00 mg/dL Final         Passed - K in normal range and within 180 days    Potassium  Date Value Ref Range Status  05/31/2023 3.5 3.5 - 5.1 mmol/L Final         Passed - Patient is not pregnant      Passed - Last BP in normal range    BP Readings from Last 1 Encounters:  06/02/23 112/60         Passed - Valid encounter within last 6 months    Recent Outpatient Visits           3 months ago Community  acquired pneumonia of right middle lobe of lung   [place] [facility] at [facility], Leita DEL, MD   4 months ago Type II diabetes mellitus with complication [facility])   [place] [facility] Care & Sports Medicine at [facility], Leita DEL, MD       Future Appointments             Tomorrow Justus, Leita DEL, MD [facility] Primary [facility] Medicine at [facility], [facility]   In 3 weeks Darron Deatrice LABOR, MD [facility] HeartCare at [facility]

## 2023-09-29 ENCOUNTER — Encounter: Payer: Self-pay | Admitting: Internal Medicine

## 2023-09-29 ENCOUNTER — Ambulatory Visit (INDEPENDENT_AMBULATORY_CARE_PROVIDER_SITE_OTHER): Admitting: Internal Medicine

## 2023-09-29 VITALS — BP 129/74 | HR 82 | Ht 61.0 in | Wt 193.4 lb

## 2023-09-29 DIAGNOSIS — I2510 Atherosclerotic heart disease of native coronary artery without angina pectoris: Secondary | ICD-10-CM | POA: Insufficient documentation

## 2023-09-29 DIAGNOSIS — E1169 Type 2 diabetes mellitus with other specified complication: Secondary | ICD-10-CM

## 2023-09-29 DIAGNOSIS — Z7984 Long term (current) use of oral hypoglycemic drugs: Secondary | ICD-10-CM | POA: Diagnosis not present

## 2023-09-29 DIAGNOSIS — Z13820 Encounter for screening for osteoporosis: Secondary | ICD-10-CM

## 2023-09-29 DIAGNOSIS — E118 Type 2 diabetes mellitus with unspecified complications: Secondary | ICD-10-CM

## 2023-09-29 DIAGNOSIS — I10 Essential (primary) hypertension: Secondary | ICD-10-CM | POA: Diagnosis not present

## 2023-09-29 DIAGNOSIS — E785 Hyperlipidemia, unspecified: Secondary | ICD-10-CM

## 2023-09-29 DIAGNOSIS — Z0000 Encounter for general adult medical examination without abnormal findings: Secondary | ICD-10-CM

## 2023-09-29 DIAGNOSIS — Z1231 Encounter for screening mammogram for malignant neoplasm of breast: Secondary | ICD-10-CM | POA: Diagnosis not present

## 2023-09-29 MED ORDER — ROSUVASTATIN CALCIUM 20 MG PO TABS: 20.0000 mg | ORAL_TABLET | Freq: Every day | ORAL | 1 refills | Status: DC

## 2023-09-29 MED ORDER — EMPAGLIFLOZIN 25 MG PO TABS: 25.0000 mg | ORAL_TABLET | Freq: Every day | ORAL | 1 refills | Status: DC

## 2023-09-29 NOTE — Assessment & Plan Note (Signed)
 Now on Crestor  and tolerating fairly well. Seeing Cardiology in the near future for further evaluation of CT findings

## 2023-09-29 NOTE — Assessment & Plan Note (Signed)
 Blood sugars have been stable.  No hypoglycemic events since last visit. Currently medications are MTF and Jardiance . Last visit medical regimen changes were none. Lab Results  Component Value Date   HGBA1C 6.1 (A) 05/18/2023

## 2023-09-29 NOTE — Assessment & Plan Note (Signed)
 Blood pressure is well controlled on losartan . No medication side effects noted. Plan to continue current medications.

## 2023-09-29 NOTE — Progress Notes (Signed)
 Date:  09/29/2023   Name:  Brittany Forbes   DOB:  March 14, 1960   MRN:  [id number]   Chief Complaint: Annual Exam Brittany Forbes is a 63 y.o. female who presents today for her Complete Annual Exam. She feels well. She reports exercising some. She reports she is sleeping well. Breast complaints - none.  Health Maintenance  Topic Date Due   COVID-19 Vaccine (3 - 2024-25 season) 10/12/2022   DEXA scan (bone density measurement)  Never done   Yearly kidney health urinalysis for diabetes  09/26/2023   Flu Shot  09/11/2023   Mammogram  11/12/2023   Hemoglobin A1C  11/17/2023   Eye exam for diabetics  01/02/2024   Yearly kidney function blood test for diabetes  05/30/2024   Complete foot exam   09/28/2024   Colon Cancer Screening  02/15/2031   DTaP/Tdap/Td vaccine (4 - Td or Tdap) 05/08/2031   Pneumococcal Vaccine for age over 59  Completed   Hepatitis C Screening  Completed   HIV Screening  Completed   Zoster (Shingles) Vaccine  Completed   Hepatitis B Vaccine  Aged Out   HPV Vaccine  Aged Out   Meningitis B Vaccine  Aged Out   Pneumococcal Vaccine  Discontinued    Hypertension This is a chronic problem. The problem is controlled. Pertinent negatives include no chest pain, headaches, palpitations or shortness of breath. Past treatments include angiotensin blockers. The current treatment provides significant improvement. There is no history of kidney disease, CAD/MI or CVA.  Diabetes She presents for her follow-up diabetic visit. She has type 2 diabetes mellitus. Her disease course has been stable. Pertinent negatives for hypoglycemia include no dizziness, headaches or nervousness/anxiousness. Pertinent negatives for diabetes include no chest pain, no fatigue and no weakness. Pertinent negatives for diabetic complications include no CVA. Current diabetic treatments: MTF and Jardiance .  Hyperlipidemia This is a chronic problem. The problem is controlled. Pertinent negatives include no  chest pain, myalgias or shortness of breath. Current antihyperlipidemic treatment includes statins.    Review of Systems  Constitutional:  Negative for fatigue and unexpected weight change.  HENT:  Negative for trouble swallowing.   Eyes:  Negative for visual disturbance.  Respiratory:  Negative for cough, chest tightness, shortness of breath and wheezing.   Cardiovascular:  Negative for chest pain, palpitations and leg swelling.  Gastrointestinal:  Negative for abdominal pain, constipation and diarrhea.  Genitourinary:  Negative for dysuria, urgency and vaginal discharge.  Musculoskeletal:  Negative for arthralgias and myalgias.  Skin:  Negative for rash.  Allergic/Immunologic: Negative for environmental allergies.  Neurological:  Negative for dizziness, weakness, light-headedness and headaches.  Psychiatric/Behavioral:  Negative for dysphoric mood and sleep disturbance. The patient is not nervous/anxious.      Lab Results  Component Value Date   NA 131 (L) 05/31/2023   K 3.5 05/31/2023   CO2 22 05/31/2023   GLUCOSE 137 (H) 05/31/2023   BUN 11 05/31/2023   CREATININE 0.60 05/31/2023   CALCIUM  9.0 05/31/2023   EGFR 99 09/26/2022   GFRNONAA >60 05/31/2023   Lab Results  Component Value Date   CHOL 167 09/26/2022   HDL 63 09/26/2022   LDLCALC 82 09/26/2022   TRIG 125 09/26/2022   CHOLHDL 2.7 09/26/2022   Lab Results  Component Value Date   TSH 0.814 09/26/2022   Lab Results  Component Value Date   HGBA1C 6.1 (A) 05/18/2023   Lab Results  Component Value Date   WBC 11.5 (H) 05/31/2023  HGB 12.3 05/31/2023   HCT 36.9 05/31/2023   MCV 85.4 05/31/2023   PLT 271 05/31/2023   Lab Results  Component Value Date   ALT 22 05/31/2023   AST 22 05/31/2023   ALKPHOS 77 05/31/2023   BILITOT 0.8 05/31/2023   No results found for: MARIEN BOLLS, VD25OH   Patient Active Problem List   Diagnosis Date Noted   Coronary artery disease involving native coronary  artery of native heart without angina pectoris 09/29/2023   Pulmonary nodule, left 06/02/2023   RBBB (right bundle branch block) 06/02/2023   Hypokalemia 06/12/2022   Obesity (BMI 30-39.9) 06/11/2022   Bilateral primary osteoarthritis of knee 05/12/2022   Polyp of sigmoid colon    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   Tinnitus aurium, 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   Nephrolithiasis 01/23/2017   Microscopic hematuria 08/22/2016   Type II diabetes mellitus with complication (HCC) 06/06/2014   Primary hypertension 06/06/2014   Hyperlipidemia associated with type 2 diabetes mellitus (HCC) 06/06/2014   Persistent proteinuria associated with type 2 diabetes mellitus (HCC) 06/06/2014   Status post parathyroidectomy 05/29/2011    Allergies  Allergen Reactions   Ace Inhibitors     Cough   Latex     Latex bandages cause a rash, paper tape is ok   Tape     Paper tape ok    Past Surgical History:  Procedure Laterality Date   COLONOSCOPY WITH PROPOFOL  N/A 02/14/2021   Procedure: COLONOSCOPY WITH PROPOFOL ;  Surgeon: Unk Corinn Skiff, MD;  Location: [facility];  Service: Gastroenterology;  Laterality: N/A;   IR NEPHROSTOMY PLACEMENT LEFT  01/23/2017   KIDNEY STONE SURGERY Left 03/13/2005   NEPHROLITHOTOMY Left 01/23/2017   Procedure: NEPHROLITHOTOMY PERCUTANEOUS;  Surgeon: Twylla Glendia BROCKS, MD;  Location: [facility] ORS;  Service: Urology;  Laterality: Left;   PARATHYROIDECTOMY  08/11/2011   [facility]   TONSILLECTOMY      Social History   Tobacco Use   Smoking status: Never   Smokeless tobacco: Never  Vaping Use   Vaping status: Never Used  Substance Use Topics   Alcohol use: Yes    Comment: occ   Drug use: No     Medication list has been reviewed and updated.  Current Meds  Medication Sig   Cholecalciferol  50 MCG (2000 UT) CAPS Vitamin D3 50 mcg (2,000 unit) capsule   GLUCOSAMINE HCL-MSM PO Take 1-2 tablets by mouth 2 (two) times daily. Take 2 tablets (1000mg ) in the morning and 1 tablet (500mg ) in  the evening   glucose blood (ONE TOUCH ULTRA TEST) test strip Use to test blood sugar daily   losartan  (COZAAR ) 50 MG tablet TAKE 1 TABLET DAILY   metFORMIN  (GLUCOPHAGE ) 500 MG tablet TAKE 1 TABLET 3 TIMES A DAY   MULTIPLE VITAMIN PO Take 1 tablet by mouth daily.   OneTouch Delica Lancets 33G MISC Use 1 each 2 (two) times daily to test blood sugar. Extra fine   potassium citrate  (UROCIT-K ) 10 MEQ (1080 MG) SR tablet TAKE 2 TABLETS BY MOUTH DAILY   Probiotic Product (UP4 PROBIOTICS ADULT PO) Take by mouth.   [DISCONTINUED] empagliflozin  (JARDIANCE ) 25 MG TABS tablet TAKE 1 TABLET DAILY BEFORE BREAKFAST   [DISCONTINUED] rosuvastatin  (CRESTOR ) 20 MG tablet Take 1 tablet (20 mg total) by mouth daily.       09/29/2023    8:11 AM 06/02/2023    9:17 AM 05/18/2023    3:58 PM 01/26/2023    3:54 PM  GAD 7 : Generalized Anxiety Score  Nervous, Anxious,  on Edge 1 0 0 0  Control/stop worrying 0 0 0 0  Worry too much - different things 0 0 0 0  Trouble relaxing 1 0 0 0  Restless 0 0 0 0  Easily annoyed or irritable 1 1 0 0  Afraid - awful might happen 0 0 0 0  Total GAD 7 Score 3 1 0 0  Anxiety Difficulty Not difficult at all Not difficult at all Not difficult at all Not difficult at all       09/29/2023    8:10 AM 06/02/2023    9:16 AM 05/18/2023    3:58 PM  Depression screen PHQ 2/9  Decreased Interest 0 1 1  Down, Depressed, Hopeless 0 0 0  PHQ - 2 Score 0 1 1  Altered sleeping 1 1 2   Tired, decreased energy 0 2 1  Change in appetite 1 1 0  Feeling bad or failure about yourself  0 0   Trouble concentrating 0 0 0  Moving slowly or fidgety/restless 0 1 0  Suicidal thoughts 0 0 0  PHQ-9 Score 2 6 4   Difficult doing work/chores Not difficult at all Somewhat difficult Not difficult at all    BP Readings from Last 3 Encounters:  09/29/23 129/74  06/02/23 112/60  05/31/23 131/62    Physical Exam Vitals and nursing note reviewed.  Constitutional:      General: She is not in acute  distress.    Appearance: She is well-developed.  HENT:     Head: Normocephalic and atraumatic.     Right Ear: Tympanic membrane and ear canal normal.     Left Ear: Tympanic membrane and ear canal normal.     Nose:     Right Sinus: No maxillary sinus tenderness.     Left Sinus: No maxillary sinus tenderness.  Eyes:     General: No scleral icterus.       Right eye: No discharge.        Left eye: No discharge.     Conjunctiva/sclera: Conjunctivae normal.  Neck:     Thyroid: No thyromegaly.     Vascular: No carotid bruit.  Cardiovascular:     Rate and Rhythm: Normal rate and regular rhythm.     Pulses: Normal pulses.     Heart sounds: Normal heart sounds.  Pulmonary:     Effort: Pulmonary effort is normal. No respiratory distress.     Breath sounds: No wheezing.  Abdominal:     General: Bowel sounds are normal.     Palpations: Abdomen is soft.     Tenderness: There is no abdominal tenderness.  Musculoskeletal:     Cervical back: Normal range of motion. No erythema.     Right lower leg: No edema.     Left lower leg: No edema.  Lymphadenopathy:     Cervical: No cervical adenopathy.  Skin:    General: Skin is warm and dry.     Capillary Refill: Capillary refill takes less than 2 seconds.     Findings: No rash.  Neurological:     General: No focal deficit present.     Mental Status: She is alert and oriented to person, place, and time.     Cranial Nerves: No cranial nerve deficit.     Sensory: No sensory deficit.     Deep Tendon Reflexes: Reflexes are normal and symmetric.  Psychiatric:        Attention and Perception: Attention normal.  Mood and Affect: Mood normal.    Diabetic Foot Exam - Simple   Simple Foot Form Diabetic Foot exam was performed with the following findings: Yes 09/29/2023  8:28 AM  Visual Inspection No deformities, no ulcerations, no other skin breakdown bilaterally: Yes Sensation Testing Intact to touch and monofilament testing bilaterally:  Yes Pulse Check Posterior Tibialis and Dorsalis pulse intact bilaterally: Yes Comments      Wt Readings from Last 3 Encounters:  09/29/23 193 lb 6.4 oz (87.7 kg)  06/02/23 187 lb (84.8 kg)  05/31/23 185 lb (83.9 kg)    BP 129/74   Pulse 82   Ht 5' 1 (1.549 m)   Wt 193 lb 6.4 oz (87.7 kg)   SpO2 100%   BMI 36.54 kg/m   Assessment and Plan:  Problem List Items Addressed This Visit       Unprioritized   Type II diabetes mellitus with complication (HCC) (Chronic)   Blood sugars have been stable.  No hypoglycemic events since last visit. Currently medications are MTF and Jardiance . Last visit medical regimen changes were none. Lab Results  Component Value Date   HGBA1C 6.1 (A) 05/18/2023          Relevant Medications   empagliflozin  (JARDIANCE ) 25 MG TABS tablet   rosuvastatin  (CRESTOR ) 20 MG tablet   Other Relevant Orders   Comprehensive metabolic panel with GFR   Hemoglobin A1c   Microalbumin / creatinine urine ratio   Primary hypertension (Chronic)   Blood pressure is well controlled on losartan . No medication side effects noted. Plan to continue current medications.       Relevant Medications   rosuvastatin  (CRESTOR ) 20 MG tablet   Other Relevant Orders   CBC with Differential/Platelet   Comprehensive metabolic panel with GFR   TSH   Hyperlipidemia associated with type 2 diabetes mellitus (HCC) (Chronic)   LDL is  Lab Results  Component Value Date   LDLCALC 82 09/26/2022   Currently taking Crestor .  She has some tingling off and on in hands and feet - but no pain/myalgias. Recommended LDL goal is < 70.       Relevant Medications   empagliflozin  (JARDIANCE ) 25 MG TABS tablet   rosuvastatin  (CRESTOR ) 20 MG tablet   Other Relevant Orders   Lipid panel   Coronary artery disease involving native coronary artery of native heart without angina pectoris   Now on Crestor  and tolerating fairly well. Seeing Cardiology in the near future for further  evaluation of CT findings      Relevant Medications   rosuvastatin  (CRESTOR ) 20 MG tablet   Other Visit Diagnoses       Annual physical exam    -  Primary   Relevant Orders   CBC with Differential/Platelet   Comprehensive metabolic panel with GFR   Hemoglobin A1c   Lipid panel   Microalbumin / creatinine urine ratio   TSH     Encounter for screening mammogram for breast cancer       schedule at [facility] in October   Relevant Orders   MM 3D SCREENING MAMMOGRAM BILATERAL BREAST     Encounter for screening for osteoporosis       recommend DEXA in October with Mammo   Relevant Orders   DG Bone Density     Long term current use of oral hypoglycemic drug           Return in about 4 months (around 01/29/2024) for DM, HTN.    Leita HUNT  Justus, MD [facility] Primary Care and Sports Medicine [place]

## 2023-09-29 NOTE — Patient Instructions (Signed)
 Call [facility] to schedule your mammogram and Bone Density at [phone].

## 2023-09-29 NOTE — Assessment & Plan Note (Addendum)
 LDL is  Lab Results  Component Value Date   LDLCALC 82 09/26/2022   Currently taking Crestor .  She has some tingling off and on in hands and feet - but no pain/myalgias. Recommended LDL goal is < 70.

## 2023-10-01 LAB — CBC WITH DIFFERENTIAL/PLATELET
Basophils Absolute: 0 x10E3/uL (ref 0.0–0.2)
Basos: 1 %
EOS (ABSOLUTE): 0.2 x10E3/uL (ref 0.0–0.4)
Eos: 2 %
Hematocrit: 39.3 % (ref 34.0–46.6)
Hemoglobin: 11.9 g/dL (ref 11.1–15.9)
Immature Grans (Abs): 0 x10E3/uL (ref 0.0–0.1)
Immature Granulocytes: 0 %
Lymphocytes Absolute: 2.7 x10E3/uL (ref 0.7–3.1)
Lymphs: 35 %
MCH: 26 pg — ABNORMAL LOW (ref 26.6–33.0)
MCHC: 30.3 g/dL — ABNORMAL LOW (ref 31.5–35.7)
MCV: 86 fL (ref 79–97)
Monocytes Absolute: 0.5 x10E3/uL (ref 0.1–0.9)
Monocytes: 6 %
Neutrophils Absolute: 4.4 x10E3/uL (ref 1.4–7.0)
Neutrophils: 56 %
Platelets: 237 x10E3/uL (ref 150–450)
RBC: 4.58 x10E6/uL (ref 3.77–5.28)
RDW: 14 % (ref 11.7–15.4)
WBC: 7.8 x10E3/uL (ref 3.4–10.8)

## 2023-10-01 LAB — COMPREHENSIVE METABOLIC PANEL WITH GFR
ALT: 26 IU/L (ref 0–32)
AST: 23 IU/L (ref 0–40)
Albumin: 4.6 g/dL (ref 3.9–4.9)
Alkaline Phosphatase: 127 IU/L — ABNORMAL HIGH (ref 44–121)
BUN/Creatinine Ratio: 27 (ref 12–28)
BUN: 17 mg/dL (ref 8–27)
Bilirubin Total: 0.4 mg/dL (ref 0.0–1.2)
CO2: 20 mmol/L (ref 20–29)
Calcium: 9.8 mg/dL (ref 8.7–10.3)
Chloride: 101 mmol/L (ref 96–106)
Creatinine, Ser: 0.62 mg/dL (ref 0.57–1.00)
Globulin, Total: 2.4 g/dL (ref 1.5–4.5)
Glucose: 111 mg/dL — ABNORMAL HIGH (ref 70–99)
Potassium: 4.6 mmol/L (ref 3.5–5.2)
Sodium: 137 mmol/L (ref 134–144)
Total Protein: 7 g/dL (ref 6.0–8.5)
eGFR: 99 mL/min/1.73 (ref >59)

## 2023-10-01 LAB — HEMOGLOBIN A1C
Est. average glucose Bld gHb Est-mCnc: 148 mg/dL
Hgb A1c MFr Bld: 6.8 % — ABNORMAL HIGH (ref 4.8–5.6)

## 2023-10-01 LAB — LIPID PANEL
Chol/HDL Ratio: 2.2 ratio (ref 0.0–4.4)
Cholesterol, Total: 149 mg/dL (ref 100–199)
HDL: 67 mg/dL (ref >39)
LDL Chol Calc (NIH): 59 mg/dL (ref 0–99)
Triglycerides: 132 mg/dL (ref 0–149)
VLDL Cholesterol Cal: 23 mg/dL (ref 5–40)

## 2023-10-01 LAB — MICROALBUMIN / CREATININE URINE RATIO
Creatinine, Urine: 36.9 mg/dL
Microalb/Creat Ratio: 40 mg/g{creat} — ABNORMAL HIGH (ref 0–29)
Microalbumin, Urine: 14.9 ug/mL

## 2023-10-01 LAB — TSH: TSH: 0.723 u[IU]/mL (ref 0.450–4.500)

## 2023-10-02 ENCOUNTER — Ambulatory Visit: Payer: Self-pay | Admitting: Internal Medicine

## 2023-10-16 ENCOUNTER — Telehealth: Payer: Self-pay | Admitting: *Deleted

## 2023-10-16 NOTE — Telephone Encounter (Signed)
 LMOVM to verify card hx.

## 2023-10-16 NOTE — Telephone Encounter (Signed)
  Patient returned call. She said she never seen cardiology before

## 2023-10-20 ENCOUNTER — Ambulatory Visit: Admitting: Cardiovascular Disease

## 2023-11-18 ENCOUNTER — Ambulatory Visit
Admission: RE | Admit: 2023-11-18 | Discharge: 2023-11-18 | Disposition: A | Source: Ambulatory Visit | Attending: Internal Medicine | Admitting: Internal Medicine

## 2023-11-18 ENCOUNTER — Ambulatory Visit
Admission: RE | Admit: 2023-11-18 | Discharge: 2023-11-18 | Disposition: A | Discharge status: Home / Self Care | Source: Ambulatory Visit | Attending: Internal Medicine | Admitting: Internal Medicine

## 2023-11-18 DIAGNOSIS — Z13820 Encounter for screening for osteoporosis: Secondary | ICD-10-CM | POA: Insufficient documentation

## 2023-11-18 DIAGNOSIS — Z1231 Encounter for screening mammogram for malignant neoplasm of breast: Secondary | ICD-10-CM | POA: Diagnosis present

## 2023-12-01 ENCOUNTER — Encounter: Payer: Self-pay | Admitting: Cardiovascular Disease

## 2023-12-01 ENCOUNTER — Ambulatory Visit: Attending: Cardiovascular Disease | Admitting: Cardiovascular Disease

## 2023-12-01 VITALS — BP 124/62 | HR 84 | Ht 60.0 in | Wt 190.4 lb

## 2023-12-01 DIAGNOSIS — I10 Essential (primary) hypertension: Secondary | ICD-10-CM | POA: Diagnosis not present

## 2023-12-01 DIAGNOSIS — I2510 Atherosclerotic heart disease of native coronary artery without angina pectoris: Secondary | ICD-10-CM

## 2023-12-01 DIAGNOSIS — E785 Hyperlipidemia, unspecified: Secondary | ICD-10-CM

## 2023-12-01 DIAGNOSIS — I4510 Unspecified right bundle-branch block: Secondary | ICD-10-CM | POA: Diagnosis not present

## 2023-12-01 DIAGNOSIS — E1169 Type 2 diabetes mellitus with other specified complication: Secondary | ICD-10-CM

## 2023-12-01 DIAGNOSIS — R011 Cardiac murmur, unspecified: Secondary | ICD-10-CM

## 2023-12-01 NOTE — Patient Instructions (Signed)
 Medication Instructions:  No changes *If you need a refill on your cardiac medications before your next appointment, please call your pharmacy*  Lab Work: None ordered If you have labs (blood work) drawn today and your tests are completely normal, you will receive your results only by: MyChart Message (if you have MyChart) OR A paper copy in the mail If you have any lab test that is abnormal or we need to change your treatment, we will call you to review the results.  Testing/Procedures: Your physician has requested that you have an echocardiogram. Echocardiography is a painless test that uses sound waves to create images of your heart. It provides your doctor with information about the size and shape of your heart and how well your heart's chambers and valves are working.   You may receive an ultrasound enhancing agent through an IV if needed to better visualize your heart during the echo. This procedure takes approximately one hour.  There are no restrictions for this procedure.  This will take place at [address] [facility], [place] [address]  Please note: We ask at that you not bring children with you during ultrasound (echo/ vascular) testing. Due to room size and safety concerns, children are not allowed in the ultrasound rooms during exams. Our front office staff cannot provide observation of children in our lobby area while testing is being conducted. An adult accompanying a patient to their appointment will only be allowed in the ultrasound room at the discretion of the ultrasound technician under special circumstances. We apologize for any inconvenience.   Follow-Up: At [facility], you and your health needs are our priority.  As part of our continuing mission to provide you with exceptional heart care, our providers are all part of one team.  This team includes your primary Cardiologist (physician) and Advanced Practice Providers or APPs  (Physician Assistants and Nurse Practitioners) who all work together to provide you with the care you need, when you need it.  Your next appointment:   12 month(s)  Provider:   You may see Dr. Darron or one of the following Advanced Practice Providers on your designated Care Team:   Lonni Meager, NP Lesley Maffucci, PA-C Bernardino Bring, PA-C Cadence [place], PA-C Tylene Lunch, NP Barnie Hila, NP    We recommend signing up for the patient portal called MyChart.  Sign up information is provided on this After Visit Summary.  MyChart is used to connect with patients for Virtual Visits (Telemedicine).  Patients are able to view lab/test results, encounter notes, upcoming appointments, etc.  Non-urgent messages can be sent to your provider as well.   To learn more about what you can do with MyChart, go to [email or web address].

## 2023-12-01 NOTE — Progress Notes (Unsigned)
 Cardiology Office Note   Date:  12/01/2023   ID:  Brittany Forbes, DOB April 24, 1960, MRN [id number]  PCP:  Brittany Leita DEL, MD  Cardiologist:   Deatrice Cage, MD   Chief Complaint  Patient presents with   Follow-up    CAD no complaints today. Meds reviewed verbally with pt.      History of Present Illness: Brittany Forbes is a 63 y.o. female who was referred by Dr. Justus for evaluation of coronary atherosclerosis. She has known history of essential hypertension, type 2 diabetes and hyperlipidemia. She teaches English as a second language.  She had CT chest done in July for follow-up of pulmonary nodules which incidentally showed aortic and three-vessel coronary artery calcifications.  She has type 2 diabetes for at least 20 years.  She does have family history of coronary artery disease.  Her father had myocardial infarction at the age of 29 and ultimately underwent CABG.  She is very active and exercises regularly with no cardiac symptoms.  She swims and does water aerobics as well as regular walking.  She has no lower extremity claudication.  Past Medical History:  Diagnosis Date   Anemia    H/O   Arthritis    Diabetes (HCC)    Family history of adverse reaction to anesthesia    SISTER-PT UNSURE OF REACTION   GERD (gastroesophageal reflux disease)    Hematuria    History of kidney stones    HLD (hyperlipidemia)    HTN (hypertension)    Sepsis (HCC) 06/11/2022   Septic olecranon bursitis of left elbow 06/11/2022   Upper back pain 06/11/2022    Past Surgical History:  Procedure Laterality Date   COLONOSCOPY WITH PROPOFOL  N/A 02/14/2021   Procedure: COLONOSCOPY WITH PROPOFOL ;  Surgeon: Unk Corinn Skiff, MD;  Location: [facility] ENDOSCOPY;  Service: Gastroenterology;  Laterality: N/A;   IR NEPHROSTOMY PLACEMENT LEFT  01/23/2017   KIDNEY STONE SURGERY Left 03/13/2005   NEPHROLITHOTOMY Left 01/23/2017   Procedure: NEPHROLITHOTOMY PERCUTANEOUS;  Surgeon: Twylla Glendia BROCKS, MD;  Location: [facility] ORS;  Service: Urology;  Laterality: Left;   PARATHYROIDECTOMY  08/11/2011   [facility]   TONSILLECTOMY       Current Outpatient Medications  Medication Sig Dispense Refill   Cholecalciferol  50 MCG (2000 UT) CAPS Vitamin D3 50 mcg (2,000 unit) capsule     empagliflozin  (JARDIANCE ) 25 MG TABS tablet Take 1 tablet (25 mg total) by mouth daily before breakfast. 90 tablet 1   GLUCOSAMINE HCL-MSM PO Take 1-2 tablets by mouth 2 (two) times daily. Take 2 tablets (1000mg ) in the morning and 1 tablet (500mg ) in the evening     glucose blood (ONE TOUCH ULTRA TEST) test strip Use to test blood sugar daily 100 each 3   losartan  (COZAAR ) 50 MG tablet TAKE 1 TABLET DAILY 90 tablet 0   metFORMIN  (GLUCOPHAGE ) 500 MG tablet TAKE 1 TABLET 3 TIMES A DAY 270 tablet 0   MULTIPLE VITAMIN PO Take 1 tablet by mouth daily.     OneTouch Delica Lancets 33G MISC Use 1 each 2 (two) times daily to test blood sugar. Extra fine 100 each 12   potassium citrate  (UROCIT-K ) 10 MEQ (1080 MG) SR tablet TAKE 2 TABLETS BY MOUTH DAILY (Patient taking differently: Take 20 mEq by mouth daily. Taking 1 tablet by mouth 3 times a week.) 180 tablet 0   Probiotic Product (UP4 PROBIOTICS ADULT PO) Take by mouth.     rosuvastatin  (CRESTOR ) 20 MG tablet Take 1  tablet (20 mg total) by mouth daily. 90 tablet 1   No current facility-administered medications for this visit.    Allergies:   Ace inhibitors, Latex, and Tape    Social History:  The patient  reports that she has never smoked. She has never used smokeless tobacco. She reports current alcohol use. She reports that she does not use drugs.   Family History:  The patient's family history includes Arthritis in her mother; Breast cancer (age of onset: 70) in her maternal aunt; Cancer in her mother; Diabetes in her father; Heart disease in her father; Hypertension in her mother; Obesity in her mother.    ROS:  Please see the history of present illness.    Otherwise, review of systems are positive for none.   All other systems are reviewed and negative.    PHYSICAL EXAM: VS:  BP 124/62 (BP Location: Right Arm, Cuff Size: Normal)   Pulse 84   Ht 5' (1.524 m)   Wt 190 lb 6 oz (86.4 kg)   SpO2 98%   BMI 37.18 kg/m  , BMI Body mass index is 37.18 kg/m. GEN: Well nourished, well developed, in no acute distress  HEENT: normal  Neck: no JVD, carotid bruits, or masses Cardiac: RRR; no rubs, or gallops,no edema .  2 out of 6 systolic murmur in the aortic area Respiratory:  clear to auscultation bilaterally, normal work of breathing GI: soft, nontender, nondistended, + BS MS: no deformity or atrophy  Skin: warm and dry, no rash Neuro:  Strength and sensation are intact Psych: euthymic mood, full affect   EKG:  EKG is ordered today. The ekg ordered today demonstrates : Sinus rhythm with occasional Premature ventricular complexes Incomplete right bundle branch block When compared with ECG of 31-May-2023 10:47, Premature ventricular complexes are now Present Premature atrial complexes are no longer Present Incomplete right bundle branch block has replaced Right bundle branch block    Recent Labs: 09/29/2023: ALT 26; BUN 17; Creatinine, Ser 0.62; Hemoglobin 11.9; Platelets 237; Potassium 4.6; Sodium 137; TSH 0.723    Lipid Panel    Component Value Date/Time   CHOL 149 09/29/2023 0847   TRIG 132 09/29/2023 0847   HDL 67 09/29/2023 0847   CHOLHDL 2.2 09/29/2023 0847   LDLCALC 59 09/29/2023 0847      Wt Readings from Last 3 Encounters:  12/01/23 190 lb 6 oz (86.4 kg)  09/29/23 193 lb 6.4 oz (87.7 kg)  06/02/23 187 lb (84.8 kg)           11/28/2023    2:28 PM 10/18/2023    8:50 PM  PAD Screen  Previous PAD dx? No No  Previous surgical procedure? No No  Pain with walking? No No  Feet/toe relief with dangling? No No  Painful, non-healing ulcers? No No  Extremities discolored? No No      ASSESSMENT AND PLAN:  1.   Coronary atherosclerosis based on CT imaging: She has no symptoms suggestive of angina and she is very active.  Recommend treatment of risk factors.  2.  Aortic atherosclerosis: No claudication.  3.  Cardiac murmur suggestive of aortic valve calcifications: I requested an echocardiogram for evaluation.  4.  Essential hypertension: Blood pressure is well-controlled on current medications.  5.  Hyperlipidemia: Under excellent control on rosuvastatin  20 mg daily.  Most recent lipid profile showed an LDL of 59.    Disposition:   FU with me in 1 year  Signed,  Deatrice Cage, MD  12/01/2023 3:55 PM    [facility]

## 2023-12-17 ENCOUNTER — Other Ambulatory Visit: Payer: Self-pay | Admitting: Internal Medicine

## 2023-12-17 DIAGNOSIS — I10 Essential (primary) hypertension: Secondary | ICD-10-CM

## 2023-12-17 DIAGNOSIS — E1129 Type 2 diabetes mellitus with other diabetic kidney complication: Secondary | ICD-10-CM

## 2023-12-18 NOTE — Telephone Encounter (Signed)
 Requested Prescriptions  Pending Prescriptions Disp Refills   metFORMIN  (GLUCOPHAGE ) 500 MG tablet [Pharmacy Med Name: METFORMIN  TAB 500MG ] 270 tablet 1    Sig: TAKE 1 TABLET 3 TIMES A DAY     Endocrinology:  Diabetes - Biguanides Failed - 12/18/2023 12:43 PM      Failed - B12 Level in normal range and within 720 days    No results found for: VITAMINB12       Passed - Cr in normal range and within 360 days    Creatinine, Ser  Date Value Ref Range Status  09/29/2023 0.62 0.57 - 1.00 mg/dL Final         Passed - HBA1C is between 0 and 7.9 and within 180 days    Hgb A1c MFr Bld  Date Value Ref Range Status  09/29/2023 6.8 (H) 4.8 - 5.6 % Final    Comment:             Prediabetes: 5.7 - 6.4          Diabetes: >6.4          Glycemic control for adults with diabetes: <7.0          Passed - eGFR in normal range and within 360 days    GFR calc Af Amer  Date Value Ref Range Status  09/05/2019 117 >59 mL/min/1.73 Final    Comment:    **[facility] currently reports eGFR in compliance with the current**   recommendations of the [employer]. [facility] will   update reporting as new guidelines are published from the NKF-ASN   Task force.    GFR, Estimated  Date Value Ref Range Status  05/31/2023 >60 >60 mL/min Final    Comment:    (NOTE) Calculated using the CKD-EPI Creatinine Equation (2021)    eGFR  Date Value Ref Range Status  09/29/2023 99 >59 mL/min/1.73 Final         Passed - Valid encounter within last 6 months    Recent Outpatient Visits           2 months ago Annual physical exam   [place] [facility], Leita DEL, MD   6 months ago Community acquired pneumonia of right middle lobe of lung   [place] [facility] at [facility], Leita DEL, MD   7 months ago Type II diabetes mellitus with complication [facility])   [place] [facility] at [facility], Leita DEL, MD              Passed - CBC within normal limits and completed in the last 12 months    WBC  Date Value Ref Range Status  09/29/2023 7.8 3.4 - 10.8 x10E3/uL Final  05/31/2023 11.5 (H) 4.0 - 10.5 K/uL Final   RBC  Date Value Ref Range Status  09/29/2023 4.58 3.77 - 5.28 x10E6/uL Final  05/31/2023 4.32 3.87 - 5.11 MIL/uL Final   Hemoglobin  Date Value Ref Range Status  09/29/2023 11.9 11.1 - 15.9 g/dL Final   Hematocrit  Date Value Ref Range Status  09/29/2023 39.3 34.0 - 46.6 % Final   MCHC  Date Value Ref Range Status  09/29/2023 30.3 (L) 31.5 - 35.7 g/dL Final  95/79/7974 66.6 30.0 - 36.0 g/dL Final   [facility]  Date Value Ref Range Status  09/29/2023 26.0 (L) 26.6 - 33.0 pg Final  05/31/2023 28.5 26.0 - 34.0 pg  Final   MCV  Date Value Ref Range Status  09/29/2023 86 79 - 97 fL Final   No results found for: PLTCOUNTKUC, LABPLAT, POCPLA RDW  Date Value Ref Range Status  09/29/2023 14.0 11.7 - 15.4 % Final          losartan  (COZAAR ) 50 MG tablet [Pharmacy Med Name: LOSARTAN  POT TAB 50MG ] 90 tablet 1    Sig: TAKE 1 TABLET DAILY     Cardiovascular:  Angiotensin Receptor Blockers Passed - 12/18/2023 12:43 PM      Passed - Cr in normal range and within 180 days    Creatinine, Ser  Date Value Ref Range Status  09/29/2023 0.62 0.57 - 1.00 mg/dL Final         Passed - K in normal range and within 180 days    Potassium  Date Value Ref Range Status  09/29/2023 4.6 3.5 - 5.2 mmol/L Final         Passed - Patient is not pregnant      Passed - Last BP in normal range    BP Readings from Last 1 Encounters:  12/01/23 124/62         Passed - Valid encounter within last 6 months    Recent Outpatient Visits           2 months ago Annual physical exam   [place] [facility], Leita DEL, MD   6 months ago Community acquired pneumonia of right middle lobe of lung   [place] [facility], Leita DEL, MD   7 months ago Type II diabetes mellitus with complication [facility])   [place] [place] [facility] at [facility], Leita DEL, MD

## 2024-01-06 ENCOUNTER — Encounter: Payer: Self-pay | Admitting: Internal Medicine

## 2024-01-06 NOTE — Telephone Encounter (Signed)
 Please review.  KP

## 2024-01-19 ENCOUNTER — Ambulatory Visit

## 2024-01-26 ENCOUNTER — Ambulatory Visit: Attending: Cardiovascular Disease

## 2024-01-26 DIAGNOSIS — R011 Cardiac murmur, unspecified: Secondary | ICD-10-CM

## 2024-01-26 DIAGNOSIS — I081 Rheumatic disorders of both mitral and tricuspid valves: Secondary | ICD-10-CM

## 2024-01-26 LAB — ECHOCARDIOGRAM COMPLETE
AR max vel: 2.42 cm2
AV Area VTI: 2.36 cm2
AV Area mean vel: 2.29 cm2
AV Mean grad: 7 mmHg
AV Peak grad: 13.4 mmHg
Ao pk vel: 1.83 m/s
Area-P 1/2: 2.41 cm2
MV VTI: 1.26 cm2
S' Lateral: 2.84 cm

## 2024-01-27 ENCOUNTER — Encounter: Payer: Self-pay | Admitting: Internal Medicine

## 2024-01-27 ENCOUNTER — Ambulatory Visit: Admitting: Internal Medicine

## 2024-01-27 VITALS — BP 122/52 | HR 44 | Ht 60.0 in | Wt 190.0 lb

## 2024-01-27 DIAGNOSIS — E1169 Type 2 diabetes mellitus with other specified complication: Secondary | ICD-10-CM

## 2024-01-27 DIAGNOSIS — R002 Palpitations: Secondary | ICD-10-CM | POA: Insufficient documentation

## 2024-01-27 DIAGNOSIS — I10 Essential (primary) hypertension: Secondary | ICD-10-CM

## 2024-01-27 DIAGNOSIS — Z7984 Long term (current) use of oral hypoglycemic drugs: Secondary | ICD-10-CM

## 2024-01-27 DIAGNOSIS — E785 Hyperlipidemia, unspecified: Secondary | ICD-10-CM

## 2024-01-27 DIAGNOSIS — M65312 Trigger thumb, left thumb: Secondary | ICD-10-CM | POA: Diagnosis not present

## 2024-01-27 DIAGNOSIS — E118 Type 2 diabetes mellitus with unspecified complications: Secondary | ICD-10-CM

## 2024-01-27 LAB — POCT GLYCOSYLATED HEMOGLOBIN (HGB A1C): Hemoglobin A1C: 6.8 % — AB (ref 4.0–5.6)

## 2024-01-27 MED ORDER — EMPAGLIFLOZIN 25 MG PO TABS: 25.0000 mg | ORAL_TABLET | Freq: Every day | ORAL | 1 refills | Status: AC

## 2024-01-27 NOTE — Assessment & Plan Note (Signed)
 Continue splinting and anti-inflammatories If worsening, can see SM for cortisone injection

## 2024-01-27 NOTE — Assessment & Plan Note (Signed)
 She did not tolerate Crestor  due to myalgia and fatigue. Has also been having palpitations since taking it - these have continued. She resumed atorvastatin  10 mg. Will need labs next visit.

## 2024-01-27 NOTE — Assessment & Plan Note (Signed)
 Currently medications are MTF and Jardiance .  No hypoglycemic episodes noted. Last visit medical regimen changes were none. Lab Results  Component Value Date   HGBA1C 6.8 (A) 01/27/2024   A1C today = 6.8 She is up to date on eye and foot exams

## 2024-01-27 NOTE — Progress Notes (Signed)
 Date:  01/27/2024   Name:  Brittany Forbes   DOB:  December 07, 1960   MRN:  [id number]   Chief Complaint: Diabetes, Hypertension, and Hand Pain (Left, thumb and index finger concerns; denies injury, numbness, tingling; pos decreased ROM)  Diabetes She presents for her follow-up diabetic visit. She has type 2 diabetes mellitus. Her disease course has been stable. Pertinent negatives for hypoglycemia include no dizziness, headaches or nervousness/anxiousness. Pertinent negatives for diabetes include no chest pain, no fatigue and no weakness.  Hypertension This is a chronic problem. The problem is controlled. Associated symptoms include palpitations. Pertinent negatives include no chest pain, headaches or shortness of breath.  Hand Pain  Pertinent negatives include no chest pain.  Palpitations  This is a recurrent problem. The problem occurs every several days. On average, each episode lasts 10 seconds. Nothing aggravates the symptoms. Pertinent negatives include no anxiety, chest pain, coughing, dizziness, shortness of breath or weakness. She has tried nothing for the symptoms. Risk factors include dyslipidemia and diabetes mellitus (Recent ECHO showed atrial dilation).    Review of Systems  Constitutional:  Negative for fatigue and unexpected weight change.  HENT:  Negative for trouble swallowing.   Eyes:  Negative for visual disturbance.  Respiratory:  Negative for cough, chest tightness, shortness of breath and wheezing.   Cardiovascular:  Positive for palpitations. Negative for chest pain and leg swelling.  Gastrointestinal:  Negative for abdominal pain, constipation and diarrhea.  Musculoskeletal:  Positive for arthralgias (left thumb and index finger triggering). Negative for myalgias.  Neurological:  Negative for dizziness, weakness, light-headedness and headaches.  Psychiatric/Behavioral:  Negative for dysphoric mood and sleep disturbance. The patient is not nervous/anxious.       Lab Results  Component Value Date   NA 137 09/29/2023   K 4.6 09/29/2023   CO2 20 09/29/2023   GLUCOSE 111 (H) 09/29/2023   BUN 17 09/29/2023   CREATININE 0.62 09/29/2023   CALCIUM  9.8 09/29/2023   EGFR 99 09/29/2023   GFRNONAA >60 05/31/2023   Lab Results  Component Value Date   CHOL 149 09/29/2023   HDL 67 09/29/2023   LDLCALC 59 09/29/2023   TRIG 132 09/29/2023   CHOLHDL 2.2 09/29/2023   Lab Results  Component Value Date   TSH 0.723 09/29/2023   Lab Results  Component Value Date   HGBA1C 6.8 (A) 01/27/2024   Lab Results  Component Value Date   WBC 7.8 09/29/2023   HGB 11.9 09/29/2023   HCT 39.3 09/29/2023   MCV 86 09/29/2023   PLT 237 09/29/2023   Lab Results  Component Value Date   ALT 26 09/29/2023   AST 23 09/29/2023   ALKPHOS 127 (H) 09/29/2023   BILITOT 0.4 09/29/2023   No results found for: MARIEN BOLLS, VD25OH   Patient Active Problem List   Diagnosis Date Noted   Palpitations 01/27/2024   Trigger finger of left thumb 01/27/2024   Coronary artery disease involving native coronary artery of native heart without angina pectoris 09/29/2023   Pulmonary nodule, left 06/02/2023   RBBB (right bundle branch block) 06/02/2023   Hypokalemia 06/12/2022   Obesity (BMI 30-39.9) 06/11/2022   Bilateral primary osteoarthritis of knee 05/12/2022   Polyp of sigmoid colon    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   Nephrolithiasis 01/23/2017   Microscopic hematuria 08/22/2016   Type II diabetes mellitus with complication (HCC) 06/06/2014   Primary hypertension 06/06/2014   Hyperlipidemia associated with type 2 diabetes mellitus (HCC) 06/06/2014   Persistent proteinuria associated  with type 2 diabetes mellitus (HCC) 06/06/2014   Status post parathyroidectomy 05/29/2011    Allergies[1]  Past Surgical History:  Procedure Laterality Date   COLONOSCOPY WITH PROPOFOL  N/A 02/14/2021   Procedure: COLONOSCOPY WITH PROPOFOL ;  Surgeon: Unk Corinn Skiff, MD;   Location: [facility] ENDOSCOPY;  Service: Gastroenterology;  Laterality: N/A;   IR NEPHROSTOMY PLACEMENT LEFT  01/23/2017   KIDNEY STONE SURGERY Left 03/13/2005   NEPHROLITHOTOMY Left 01/23/2017   Procedure: NEPHROLITHOTOMY PERCUTANEOUS;  Surgeon: Twylla Glendia BROCKS, MD;  Location: [facility] ORS;  Service: Urology;  Laterality: Left;   PARATHYROIDECTOMY  08/11/2011   [facility]   TONSILLECTOMY      Social History[2]   Medication list has been reviewed and updated.  Active Medications[3]     09/29/2023    8:11 AM 06/02/2023    9:17 AM 05/18/2023    3:58 PM 01/26/2023    3:54 PM  GAD 7 : Generalized Anxiety Score  Nervous, Anxious, on Edge 1 0 0 0  Control/stop worrying 0 0 0 0  Worry too much - different things 0 0 0 0  Trouble relaxing 1 0 0 0  Restless 0 0 0 0  Easily annoyed or irritable 1 1 0 0  Afraid - awful might happen 0 0 0 0  Total GAD 7 Score 3 1 0 0  Anxiety Difficulty Not difficult at all Not difficult at all Not difficult at all Not difficult at all       09/29/2023    8:10 AM 06/02/2023    9:16 AM 05/18/2023    3:58 PM  Depression screen PHQ 2/9  Decreased Interest 0 1 1  Down, Depressed, Hopeless 0 0 0  PHQ - 2 Score 0 1 1  Altered sleeping 1 1 2   Tired, decreased energy 0 2 1  Change in appetite 1 1 0  Feeling bad or failure about yourself  0 0   Trouble concentrating 0 0 0  Moving slowly or fidgety/restless 0 1 0  Suicidal thoughts 0 0 0  PHQ-9 Score 2  6  4    Difficult doing work/chores Not difficult at all Somewhat difficult Not difficult at all     Data saved with a previous flowsheet row definition    BP Readings from Last 3 Encounters:  01/27/24 (!) 122/52  12/01/23 124/62  09/29/23 129/74    Physical Exam Vitals and nursing note reviewed.  Constitutional:      General: She is not in acute distress.    Appearance: Normal appearance. She is well-developed.  HENT:     Head: Normocephalic and atraumatic.  Cardiovascular:     Rate and Rhythm: Normal rate  and regular rhythm. No extrasystoles are present.    Heart sounds: Normal heart sounds.  Pulmonary:     Effort: Pulmonary effort is normal. No respiratory distress.     Breath sounds: No wheezing or rhonchi.  Musculoskeletal:     Left hand: Tenderness present. Normal strength. Normal sensation.     Cervical back: Normal range of motion.     Comments: Mild triggering left thumb and index finger  Lymphadenopathy:     Cervical: No cervical adenopathy.  Skin:    General: Skin is warm and dry.     Findings: No rash.  Neurological:     Mental Status: She is alert and oriented to person, place, and time.  Psychiatric:        Mood and Affect: Mood normal.  Behavior: Behavior normal.     Wt Readings from Last 3 Encounters:  01/27/24 190 lb (86.2 kg)  12/01/23 190 lb 6 oz (86.4 kg)  09/29/23 193 lb 6.4 oz (87.7 kg)    BP (!) 122/52   Pulse (!) 44   Ht 5' (1.524 m)   Wt 190 lb (86.2 kg)   SpO2 98%   BMI 37.11 kg/m   Assessment and Plan:  Problem List Items Addressed This Visit       Unprioritized   Type II diabetes mellitus with complication (HCC) - Primary (Chronic)   Currently medications are MTF and Jardiance .  No hypoglycemic episodes noted. Last visit medical regimen changes were none. Lab Results  Component Value Date   HGBA1C 6.8 (A) 01/27/2024   A1C today = 6.8 She is up to date on eye and foot exams       Relevant Medications   atorvastatin  (LIPITOR) 10 MG tablet   empagliflozin  (JARDIANCE ) 25 MG TABS tablet   Other Relevant Orders   POCT HgB A1C (Completed)   Primary hypertension (Chronic)   Well controlled blood pressure today. Current regimen is losartan . No medication side effects noted.        Relevant Medications   atorvastatin  (LIPITOR) 10 MG tablet   Hyperlipidemia associated with type 2 diabetes mellitus (HCC) (Chronic)   She did not tolerate Crestor  due to myalgia and fatigue. Has also been having palpitations since taking it - these  have continued. She resumed atorvastatin  10 mg. Will need labs next visit.      Relevant Medications   atorvastatin  (LIPITOR) 10 MG tablet   empagliflozin  (JARDIANCE ) 25 MG TABS tablet   Palpitations   New over the past few months. Concerning for paroxysmal Afib given ECHO findings Recommend she follow up with Cardiology      Trigger finger of left thumb   Continue splinting and anti-inflammatories If worsening, can see SM for cortisone injection      Other Visit Diagnoses       Long term current use of oral hypoglycemic drug           Return in about 4 months (around 05/27/2024) for DM, HTN.    Leita HILARIO Adie, MD [facility] [place]           [1]  Allergies Allergen Reactions   Crestor  [Rosuvastatin ] Other (See Comments)    myalgia   Ace Inhibitors     Cough   Latex     Latex bandages cause a rash, paper tape is ok   Tape     Paper tape ok  [2]  Social History Tobacco Use   Smoking status: Never   Smokeless tobacco: Never  Vaping Use   Vaping status: Never Used  Substance Use Topics   Alcohol use: Yes    Comment: occ   Drug use: No  [3]  Current Meds  Medication Sig   atorvastatin  (LIPITOR) 10 MG tablet Take 10 mg by mouth daily.   Cholecalciferol  50 MCG (2000 UT) CAPS Vitamin D3 50 mcg (2,000 unit) capsule   GLUCOSAMINE HCL-MSM PO Take 1-2 tablets by mouth 2 (two) times daily. Take 2 tablets (1000mg ) in the morning and 1 tablet (500mg ) in the evening   glucose blood (ONE TOUCH ULTRA TEST) test strip Use to test blood sugar daily   losartan  (COZAAR ) 50 MG tablet TAKE 1 TABLET DAILY   metFORMIN  (GLUCOPHAGE ) 500 MG tablet TAKE 1 TABLET 3 TIMES  A DAY   MULTIPLE VITAMIN PO Take 1 tablet by mouth daily.   OneTouch Delica Lancets 33G MISC Use 1 each 2 (two) times daily to test blood sugar. Extra fine   potassium citrate  (UROCIT-K ) 10 MEQ (1080 MG) SR tablet TAKE 2 TABLETS BY MOUTH DAILY (Patient taking differently:  Take 20 mEq by mouth daily. Taking 1 tablet by mouth 3 times a week.)   Probiotic Product (UP4 PROBIOTICS ADULT PO) Take by mouth.   [DISCONTINUED] empagliflozin  (JARDIANCE ) 25 MG TABS tablet Take 1 tablet (25 mg total) by mouth daily before breakfast.

## 2024-01-27 NOTE — Assessment & Plan Note (Signed)
 New over the past few months. Concerning for paroxysmal Afib given ECHO findings Recommend she follow up with Cardiology

## 2024-01-27 NOTE — Assessment & Plan Note (Signed)
 Well controlled blood pressure today. Current regimen is losartan . No medication side effects noted.

## 2024-01-28 ENCOUNTER — Ambulatory Visit: Payer: Self-pay | Admitting: Cardiovascular Disease

## 2024-01-28 DIAGNOSIS — I050 Rheumatic mitral stenosis: Secondary | ICD-10-CM

## 2024-01-28 NOTE — Telephone Encounter (Signed)
-----   Message from Deatrice Cage, MD sent at 01/28/2024 11:58 AM EST ----- Inform patient that echo showed normal LV systolic function but she does have calcified mitral valve causing moderate stenosis.  Recommend repeat echocardiogram in 1 year to monitor the progression.

## 2024-01-28 NOTE — Telephone Encounter (Signed)
 Patient made aware of results and verbalized understanding. Repeat order has been placed for one year.  The patient stated that at night, at times, she will have heart palpitations. This will typically wake her up and will last a few hours. She denies any shortness of breath or chest pain. She stated that this does occur occasionally during the day but is less frequent. This has been occurring for a few weeks now.

## 2024-01-29 ENCOUNTER — Ambulatory Visit: Attending: Cardiovascular Disease

## 2024-01-29 ENCOUNTER — Telehealth: Payer: Self-pay | Admitting: Cardiovascular Disease

## 2024-01-29 DIAGNOSIS — R002 Palpitations: Secondary | ICD-10-CM

## 2024-01-29 DIAGNOSIS — I2510 Atherosclerotic heart disease of native coronary artery without angina pectoris: Secondary | ICD-10-CM

## 2024-01-29 NOTE — Telephone Encounter (Signed)
 Patient stated she is returning RN's call.

## 2024-01-29 NOTE — Telephone Encounter (Signed)
 Returned pt's call; identified pt using two identifiers:  explained to pt that I was calling on behalf of Dr. Renato office and that because she is experiencing palpitations, he would like for her to wear a [employer] Heart Monitor for two weeks;  the heart monitor will be mailed to her home in 3-5 business days, but may be delayed due to holiday mail schedule; pt is agreeable;  I explained to her about the heart monitor and went over the instructions regarding placing the heart monitor on; activating the monitor and pushing the button and writing in the pt log book when she had symptoms; at the end of the 14 days, I explained that she would remove monitor, place it in self-addressed, stamped box and return it to [facility]; I explained it would take approximately 4-5 weeks to obtain final report, sometimes sooner;  I also explained that I would place the instructions in writing on her MyChart; all questions were answered and pt verbalized understanding.    Also offered that if she needed assistance placing monitor she could call our office and schedule a nurse visit to have monitor place.  Pt thanked me for returning her call.  Order placed.

## 2024-02-05 ENCOUNTER — Other Ambulatory Visit: Payer: Self-pay | Admitting: Internal Medicine

## 2024-02-08 NOTE — Telephone Encounter (Signed)
 Discontinued 08/18/23, dose change. Requested Prescriptions  Pending Prescriptions Disp Refills   atorvastatin  (LIPITOR) 20 MG tablet [Pharmacy Med Name: ATORVASTATIN  TAB 20MG ] 90 tablet 1    Sig: TAKE 1 TABLET DAILY     Cardiovascular:  Antilipid - Statins Failed - 02/08/2024 11:59 AM      Failed - Lipid Panel in normal range within the last 12 months    Cholesterol, Total  Date Value Ref Range Status  09/29/2023 149 100 - 199 mg/dL Final   LDL Chol Calc (NIH)  Date Value Ref Range Status  09/29/2023 59 0 - 99 mg/dL Final   HDL  Date Value Ref Range Status  09/29/2023 67 >39 mg/dL Final   Triglycerides  Date Value Ref Range Status  09/29/2023 132 0 - 149 mg/dL Final         Passed - Patient is not pregnant      Passed - Valid encounter within last 12 months    Recent Outpatient Visits           1 week ago Type II diabetes mellitus with complication [facility])   [place] [facility] at [facility], Leita DEL, MD   4 months ago Annual physical exam   [facility] at [facility], Leita DEL, MD   8 months ago Community acquired pneumonia of right middle lobe of lung   [place] [facility] at [facility], Leita DEL, MD   8 months ago Type II diabetes mellitus with complication [facility])   [place] [facility] at [facility], Leita DEL, MD

## 2024-02-15 ENCOUNTER — Other Ambulatory Visit: Payer: Self-pay

## 2024-02-15 NOTE — Telephone Encounter (Signed)
 Copied from CRM [id number]. Topic: Clinical - Prescription Issue >> Feb 15, 2024  3:06 PM Terri MATSU wrote: Reason for CRM: Patient stated Dr.Berglund in Dec and she put her back on atorvastatin  (LIPITOR) 10 MG tablet and she won't see Dr.Liang until April 21. And [facility] won't fill her prescription until she has a new prescription. So if Dr.Liang can write a new prescription for it. Callback number [phone]

## 2024-02-17 MED ORDER — ATORVASTATIN CALCIUM 10 MG PO TABS: 10.0000 mg | ORAL_TABLET | Freq: Every day | ORAL | 1 refills | Status: DC

## 2024-02-25 ENCOUNTER — Telehealth: Payer: Self-pay | Admitting: Cardiology

## 2024-02-25 DIAGNOSIS — I442 Atrioventricular block, complete: Secondary | ICD-10-CM

## 2024-02-25 DIAGNOSIS — I050 Rheumatic mitral stenosis: Secondary | ICD-10-CM

## 2024-02-25 NOTE — Telephone Encounter (Signed)
 Received call from [facility] with patient having completed cardiac monitor results. Noted to have episode of CHB on 1/2 at 4am auto-triggered for total of 20 secs with HR average of 29 bpm. Will route to PCP cardiologist regarding findings for follow up

## 2024-02-26 ENCOUNTER — Other Ambulatory Visit: Payer: Self-pay

## 2024-02-26 MED ORDER — ATORVASTATIN CALCIUM 10 MG PO TABS: 10.0000 mg | ORAL_TABLET | Freq: Every day | ORAL | 1 refills | Status: AC

## 2024-02-26 NOTE — Telephone Encounter (Signed)
 Contacted patient to review what EP treats vs. Gen Cards - patient verbalized understanding and requested atorvastatin  RX be changed to local pharmacy

## 2024-02-26 NOTE — Telephone Encounter (Signed)
 Please arrange for urgent EP consultation for complete heart block.

## 2024-02-26 NOTE — Telephone Encounter (Signed)
 Spoke w/ patient - she is scheduled on 1/30 with JP in [place] for EP referral. She would like more information on why she's being referred.

## 2024-02-26 NOTE — Addendum Note (Signed)
 Addended by: ESTELLE OLAM GRADE on: 02/26/2024 02:16 PM   Modules accepted: Orders

## 2024-02-26 NOTE — Telephone Encounter (Signed)
 Left a message for the patient to call back.  Urgent referral placed for EP consult.

## 2024-02-26 NOTE — Progress Notes (Signed)
 Pharmacy change only for atorvastatin  to local pharmacy per pt request

## 2024-02-29 ENCOUNTER — Ambulatory Visit: Payer: Self-pay | Admitting: Cardiovascular Disease

## 2024-02-29 DIAGNOSIS — I2510 Atherosclerotic heart disease of native coronary artery without angina pectoris: Secondary | ICD-10-CM | POA: Diagnosis not present

## 2024-02-29 DIAGNOSIS — R002 Palpitations: Secondary | ICD-10-CM

## 2024-02-29 NOTE — Telephone Encounter (Signed)
 Patient made aware of results and verbalized understanding.  Per Dr. Darron,  Inform patient that outpatient monitor showed evidence of complete heart block with reasonable escape rhythm.  She is scheduled to see Dr. Kennyth later this month.  If she develops syncope or presyncope, she should go to the emergency department. The presence of complete heart block is unexpected.  Recent echo did show moderate mitral stenosis with mitral annular calcifications which could be contributing.  However, I think we have to rule out other etiologies.  Recommend a cardiac MRI.  Instructions have been sent though MyChart for the Cardiac MRI.

## 2024-02-29 NOTE — Addendum Note (Signed)
 Addended by: ESTELLE OLAM GRADE on: 02/29/2024 11:24 AM   Modules accepted: Orders

## 2024-03-03 ENCOUNTER — Telehealth: Payer: Self-pay | Admitting: Internal Medicine

## 2024-03-03 NOTE — Telephone Encounter (Unsigned)
 Copied from CRM #[id number]. Topic: Clinical - Medication Refill >> Mar 03, 2024  3:14 PM Olam RAMAN wrote: Medication: atorvastatin  (LIPITOR) 10 MG tablet   Has the patient contacted their pharmacy? Yes (Agent: If no, request that the patient contact the pharmacy for the refill. If patient does not wish to contact the pharmacy document the reason why and proceed with request.) (Agent: If yes, when and what did the pharmacy advise?)  This is the patient's preferred pharmacy:  [id number] [address], [facility] - [address] [place] [address] Phone: [phone] Fax: [phone]  [facility] [place], [place] - [facility] AT Portal to Reg[facility] [address] [place] [address] Phone: [phone] Fax: [phone]  Is this the correct pharmacy for this prescription? Yes If no, delete pharmacy and type the correct one.   Has the prescription been filled recently? Yes  Is the patient out of the medication? Yes  Has the patient been seen for an appointment in the last year OR does the patient have an upcoming appointment? Yes  Can we respond through MyChart? No  Agent: Please be advised that Rx refills may take up to 3 business days. We ask that you follow-up with your pharmacy.

## 2024-03-10 LAB — HEMOGLOBIN AND HEMATOCRIT, BLOOD
Hematocrit: 46.6 % (ref 34.0–46.6)
Hemoglobin: 14.2 g/dL (ref 11.1–15.9)

## 2024-03-10 LAB — BASIC METABOLIC PANEL WITH GFR
BUN/Creatinine Ratio: 33 — AB (ref 12–28)
BUN: 22 mg/dL (ref 8–27)
CO2: 20 mmol/L (ref 20–29)
Calcium: 9.9 mg/dL (ref 8.7–10.3)
Chloride: 103 mmol/L (ref 96–106)
Creatinine, Ser: 0.67 mg/dL (ref 0.57–1.00)
Glucose: 168 mg/dL — AB (ref 70–99)
Potassium: 4.7 mmol/L (ref 3.5–5.2)
Sodium: 139 mmol/L (ref 134–144)
eGFR: 96 mL/min/{1.73_m2}

## 2024-03-11 ENCOUNTER — Ambulatory Visit: Attending: Cardiology | Admitting: Cardiology

## 2024-03-11 ENCOUNTER — Encounter: Payer: Self-pay | Admitting: Cardiology

## 2024-03-11 VITALS — BP 118/68 | HR 40 | Ht 60.0 in | Wt 193.0 lb

## 2024-03-11 DIAGNOSIS — I442 Atrioventricular block, complete: Secondary | ICD-10-CM

## 2024-03-11 DIAGNOSIS — I4510 Unspecified right bundle-branch block: Secondary | ICD-10-CM | POA: Diagnosis not present

## 2024-03-11 DIAGNOSIS — R001 Bradycardia, unspecified: Secondary | ICD-10-CM | POA: Diagnosis not present

## 2024-03-11 DIAGNOSIS — R002 Palpitations: Secondary | ICD-10-CM

## 2024-03-11 NOTE — Patient Instructions (Signed)
 Medication Instructions:  Your physician recommends that you continue on your current medications as directed. Please refer to the Current Medication list given to you today.  *If you need a refill on your cardiac medications before your next appointment, please call your pharmacy*  Testing/Procedures: Pacemaker Implant Your physician has recommended that you have a pacemaker inserted. A pacemaker is a small device that is placed under the skin of your chest or abdomen to help control abnormal heart rhythms. This device uses electrical pulses to prompt the heart to beat at a normal rate. Pacemakers are used to treat heart rhythms that are too slow. Wire (leads) are attached to the pacemaker that goes into the chambers of you heart. This is done in the hospital and usually requires and overnight stay. Please see the instruction sheet given to you today for more information.  Follow-Up: At [facility], you and your health needs are our priority.  As part of our continuing mission to provide you with exceptional heart care, our providers are all part of one team.  This team includes your primary Cardiologist (physician) and Advanced Practice Providers or APPs (Physician Assistants and Nurse Practitioners) who all work together to provide you with the care you need, when you need it.  Your next appointment:   We will contact you to schedule your post-procedure appointments

## 2024-03-13 NOTE — H&P (View-Only) (Signed)
 " Electrophysiology Office Note:   Date:  03/13/2024  ID:  Brittany Forbes, DOB 07-19-1960, MRN [id number]  Primary Cardiologist: Deatrice Cage, MD Electrophysiologist: [name], MD      History of Present Illness:   Brittany Forbes is a 64 y.o. female with h/o HTN, HLD, RBBB who is being seen today for complete heart block at the request of Dr. Cage.  Discussed the use of AI scribe software for clinical note transcription with the patient, who gave verbal consent to proceed.  History of Present Illness Brittany Forbes is a 64 year old female who presents with symptomatic bradycardia and requires a pacemaker. She was referred by Dr. Liberty for evaluation of her heart condition.  She has been experiencing new onset palpitations and a significant decrease in heart rate, which has dropped to the 40s. Historically, her heart rate has been high, often over 100, leading to her being turned away from donating blood. However, she has never experienced palpitations before.  She feels worse with the slow heart rate, experiencing pounding sensations when trying to sleep, and increased shortness of breath with minimal exertion, such as bending down or walking. At the beginning of the year, she was able to walk 7,000 to 9,000 steps a day, but now she finds it impossible to maintain that level of activity.  Her current medications include atorvastatin , which she switched back to after experiencing symptoms she thought might be related to Crestor . She has not been on medications like metoprolol or Cardizem, which are known to slow the heart rate.  A previous EKG from 2024 showed loss of her right bundle branch.  The initial discovery of her heart condition was incidental during a CT scan for pneumonia last spring, which led to further testing and her eventual referral to Dr. Cage.  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    Review of systems complete and found to  be negative unless listed in HPI.   EP Information / Studies Reviewed:    EKG is ordered today. Personal review as below.  EKG Interpretation Date/Time:  Friday March 11 2024 15:59:35 EST Ventricular Rate:  42 PR Interval:    QRS Duration:  122 QT Interval:  490 QTC Calculation: 409 R Axis:   139  Text Interpretation: Sinus bradycardia with complete heart block and Right bundle branch block When compared with ECG of 01-Dec-2023 15:35, Complete heart block now present Vent. rate has decreased BY  42 BPM Confirmed by Kitty Fonda [phone] on 03/11/2024 4:09:35 PM    Echo 01/26/24:   1. Left ventricular ejection fraction, by estimation, is 60 to 65%. The  left ventricle has normal function. The left ventricle has no regional  wall motion abnormalities. Left ventricular diastolic parameters were  normal. The average left ventricular  global longitudinal strain is -21.8 %. The global longitudinal strain is  normal.   2. Right ventricular systolic function is normal. The right ventricular  size is normal.   3. Left atrial size was severely dilated.   4. The mitral valve is normal in structure. Mild mitral valve  regurgitation. Mild to moderate mitral stenosis. The mean mitral valve  gradient is 9.5 mmHg. Severe mitral annular calcification.   5. Tricuspid valve regurgitation is moderate.   6. The aortic valve is tricuspid. Aortic valve regurgitation is not  visualized. No aortic stenosis is present.   7. The inferior vena cava is normal in size with greater than 50%  respiratory variability, suggesting right atrial pressure of 3 mmHg.   Physical Exam:   VS:  BP 118/68 (BP Location: Left Arm, Patient Position: Sitting, Cuff Size: Large)   Pulse (!) 40   Ht 5' (1.524 m)   Wt 193 lb (87.5 kg)   SpO2 96%   BMI 37.69 kg/m    Wt Readings from Last 3 Encounters:  03/11/24 193 lb (87.5 kg)  01/27/24 190 lb (86.2 kg)  12/01/23 190 lb 6 oz (86.4 kg)     General: Well developed,  in no acute distress.  Neck: No JVD.  Cardiac: Bradycardic, regular rhythm.  Resp: Normal work of breathing.  Ext: No edema.  Neuro: No gross focal deficits.  Psych: Normal affect.    ASSESSMENT AND PLAN:    #Complete heart block: Patient with pre-existing conduction disease-right bundle branch block-now progressed to complete heart block.   #Symptomatic bradycardia - Patient meets criteria for permanent pacemaker implant in the setting of symptomatic bradycardia and complete heart block.Explained risks, benefits, and alternatives to pacemaker implantation, including but not limited to bleeding, infection, damage to heart or lungs, heart attack, stroke, or death.  Pt verbalized understanding and elected to proceed.    Follow up with Dr. Kennyth on Monday for PPM implant.   Signed, Fonda Kennyth, MD  "

## 2024-03-13 NOTE — Progress Notes (Signed)
 " Electrophysiology Office Note:   Date:  03/13/2024  ID:  Brittany Forbes, DOB 07-19-1960, MRN [id number]  Primary Cardiologist: Deatrice Cage, MD Electrophysiologist: [name], MD      History of Present Illness:   Brittany Forbes is a 64 y.o. female with h/o HTN, HLD, RBBB who is being seen today for complete heart block at the request of Dr. Cage.  Discussed the use of AI scribe software for clinical note transcription with the patient, who gave verbal consent to proceed.  History of Present Illness Brittany Forbes is a 64 year old female who presents with symptomatic bradycardia and requires a pacemaker. She was referred by Dr. Liberty for evaluation of her heart condition.  She has been experiencing new onset palpitations and a significant decrease in heart rate, which has dropped to the 40s. Historically, her heart rate has been high, often over 100, leading to her being turned away from donating blood. However, she has never experienced palpitations before.  She feels worse with the slow heart rate, experiencing pounding sensations when trying to sleep, and increased shortness of breath with minimal exertion, such as bending down or walking. At the beginning of the year, she was able to walk 7,000 to 9,000 steps a day, but now she finds it impossible to maintain that level of activity.  Her current medications include atorvastatin , which she switched back to after experiencing symptoms she thought might be related to Crestor . She has not been on medications like metoprolol or Cardizem, which are known to slow the heart rate.  A previous EKG from 2024 showed loss of her right bundle branch.  The initial discovery of her heart condition was incidental during a CT scan for pneumonia last spring, which led to further testing and her eventual referral to Dr. Cage.  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    Review of systems complete and found to  be negative unless listed in HPI.   EP Information / Studies Reviewed:    EKG is ordered today. Personal review as below.  EKG Interpretation Date/Time:  Friday March 11 2024 15:59:35 EST Ventricular Rate:  42 PR Interval:    QRS Duration:  122 QT Interval:  490 QTC Calculation: 409 R Axis:   139  Text Interpretation: Sinus bradycardia with complete heart block and Right bundle branch block When compared with ECG of 01-Dec-2023 15:35, Complete heart block now present Vent. rate has decreased BY  42 BPM Confirmed by Kitty Fonda [phone] on 03/11/2024 4:09:35 PM    Echo 01/26/24:   1. Left ventricular ejection fraction, by estimation, is 60 to 65%. The  left ventricle has normal function. The left ventricle has no regional  wall motion abnormalities. Left ventricular diastolic parameters were  normal. The average left ventricular  global longitudinal strain is -21.8 %. The global longitudinal strain is  normal.   2. Right ventricular systolic function is normal. The right ventricular  size is normal.   3. Left atrial size was severely dilated.   4. The mitral valve is normal in structure. Mild mitral valve  regurgitation. Mild to moderate mitral stenosis. The mean mitral valve  gradient is 9.5 mmHg. Severe mitral annular calcification.   5. Tricuspid valve regurgitation is moderate.   6. The aortic valve is tricuspid. Aortic valve regurgitation is not  visualized. No aortic stenosis is present.   7. The inferior vena cava is normal in size with greater than 50%  respiratory variability, suggesting right atrial pressure of 3 mmHg.   Physical Exam:   VS:  BP 118/68 (BP Location: Left Arm, Patient Position: Sitting, Cuff Size: Large)   Pulse (!) 40   Ht 5' (1.524 m)   Wt 193 lb (87.5 kg)   SpO2 96%   BMI 37.69 kg/m    Wt Readings from Last 3 Encounters:  03/11/24 193 lb (87.5 kg)  01/27/24 190 lb (86.2 kg)  12/01/23 190 lb 6 oz (86.4 kg)     General: Well developed,  in no acute distress.  Neck: No JVD.  Cardiac: Bradycardic, regular rhythm.  Resp: Normal work of breathing.  Ext: No edema.  Neuro: No gross focal deficits.  Psych: Normal affect.    ASSESSMENT AND PLAN:    #Complete heart block: Patient with pre-existing conduction disease-right bundle branch block-now progressed to complete heart block.   #Symptomatic bradycardia - Patient meets criteria for permanent pacemaker implant in the setting of symptomatic bradycardia and complete heart block.Explained risks, benefits, and alternatives to pacemaker implantation, including but not limited to bleeding, infection, damage to heart or lungs, heart attack, stroke, or death.  Pt verbalized understanding and elected to proceed.    Follow up with Dr. Kennyth on Monday for PPM implant.   Signed, Fonda Kennyth, MD  "

## 2024-03-14 ENCOUNTER — Other Ambulatory Visit: Payer: Self-pay

## 2024-03-14 ENCOUNTER — Ambulatory Visit (HOSPITAL_COMMUNITY)
Admission: RE | Admit: 2024-03-14 | Discharge: 2024-03-14 | Disposition: A | Attending: Cardiology | Admitting: Cardiology

## 2024-03-14 ENCOUNTER — Telehealth: Payer: Self-pay

## 2024-03-14 ENCOUNTER — Ambulatory Visit (HOSPITAL_COMMUNITY)

## 2024-03-14 ENCOUNTER — Encounter (HOSPITAL_COMMUNITY): Admission: RE | Disposition: A | Payer: Self-pay | Source: Home / Self Care | Attending: Cardiology

## 2024-03-14 DIAGNOSIS — Z79899 Other long term (current) drug therapy: Secondary | ICD-10-CM | POA: Insufficient documentation

## 2024-03-14 DIAGNOSIS — I10 Essential (primary) hypertension: Secondary | ICD-10-CM | POA: Insufficient documentation

## 2024-03-14 DIAGNOSIS — Z01818 Encounter for other preprocedural examination: Secondary | ICD-10-CM

## 2024-03-14 DIAGNOSIS — R001 Bradycardia, unspecified: Secondary | ICD-10-CM | POA: Insufficient documentation

## 2024-03-14 DIAGNOSIS — E785 Hyperlipidemia, unspecified: Secondary | ICD-10-CM | POA: Insufficient documentation

## 2024-03-14 DIAGNOSIS — I4510 Unspecified right bundle-branch block: Secondary | ICD-10-CM | POA: Insufficient documentation

## 2024-03-14 DIAGNOSIS — I442 Atrioventricular block, complete: Secondary | ICD-10-CM | POA: Insufficient documentation

## 2024-03-14 LAB — CBC
HCT: 42.5 % (ref 36.0–46.0)
Hemoglobin: 13.6 g/dL (ref 12.0–15.0)
MCH: 27.2 pg (ref 26.0–34.0)
MCHC: 32 g/dL (ref 30.0–36.0)
MCV: 85 fL (ref 80.0–100.0)
Platelets: 248 10*3/uL (ref 150–400)
RBC: 5 MIL/uL (ref 3.87–5.11)
RDW: 14.6 % (ref 11.5–15.5)
WBC: 9.4 10*3/uL (ref 4.0–10.5)
nRBC: 0 % (ref 0.0–0.2)

## 2024-03-14 LAB — GLUCOSE, CAPILLARY: Glucose-Capillary: 129 mg/dL — ABNORMAL HIGH (ref 70–99)

## 2024-03-14 MED ORDER — SODIUM CHLORIDE 0.9 % IV SOLN: INTRAVENOUS | Status: DC

## 2024-03-14 MED ORDER — CHLORHEXIDINE GLUCONATE 4 % EX SOLN
4.0000 | Freq: Once | CUTANEOUS | Status: DC
  Filled 2024-03-14: qty 60

## 2024-03-14 MED ORDER — LIDOCAINE HCL (PF) 1 % IJ SOLN
INTRAMUSCULAR | Status: DC | PRN
  Administered 2024-03-14: 60 mL

## 2024-03-14 MED ORDER — FENTANYL CITRATE (PF) 100 MCG/2ML IJ SOLN
INTRAMUSCULAR | Status: AC
  Filled 2024-03-14: qty 2

## 2024-03-14 MED ORDER — LIDOCAINE HCL (PF) 1 % IJ SOLN
INTRAMUSCULAR | Status: AC
  Filled 2024-03-14: qty 60

## 2024-03-14 MED ORDER — FENTANYL CITRATE (PF) 100 MCG/2ML IJ SOLN
INTRAMUSCULAR | Status: DC | PRN
  Administered 2024-03-14 (×2): 50 ug via INTRAVENOUS
  Administered 2024-03-14: 25 ug via INTRAVENOUS

## 2024-03-14 MED ORDER — POVIDONE-IODINE 10 % EX SWAB
2.0000 | Freq: Once | CUTANEOUS | Status: AC
  Administered 2024-03-14: 2 via TOPICAL

## 2024-03-14 MED ORDER — SODIUM CHLORIDE 0.9 % IV SOLN
80.0000 mg | INTRAVENOUS | Status: AC
  Administered 2024-03-14: 80 mg

## 2024-03-14 MED ORDER — MIDAZOLAM HCL 5 MG/5ML IJ SOLN
INTRAMUSCULAR | Status: DC | PRN
  Administered 2024-03-14 (×2): 1 mg via INTRAVENOUS
  Administered 2024-03-14: .5 mg via INTRAVENOUS

## 2024-03-14 MED ORDER — ACETAMINOPHEN 325 MG PO TABS
325.0000 mg | ORAL_TABLET | ORAL | Status: DC | PRN
  Administered 2024-03-14: 650 mg via ORAL
  Filled 2024-03-14: qty 2

## 2024-03-14 MED ORDER — SODIUM CHLORIDE 0.9 % IV SOLN
INTRAVENOUS | Status: AC
  Filled 2024-03-14: qty 2

## 2024-03-14 MED ORDER — CEFAZOLIN SODIUM-DEXTROSE 2-4 GM/100ML-% IV SOLN
2.0000 g | INTRAVENOUS | Status: AC
  Administered 2024-03-14: 2 g via INTRAVENOUS

## 2024-03-14 MED ORDER — CEFAZOLIN SODIUM-DEXTROSE 2-4 GM/100ML-% IV SOLN
INTRAVENOUS | Status: AC
  Filled 2024-03-14: qty 100

## 2024-03-14 MED ORDER — MIDAZOLAM HCL 5 MG/5ML IJ SOLN
INTRAMUSCULAR | Status: AC
  Filled 2024-03-14: qty 5

## 2024-03-14 MED ORDER — ONDANSETRON HCL 4 MG/2ML IJ SOLN: 4.0000 mg | Freq: Four times a day (QID) | INTRAMUSCULAR | Status: DC | PRN

## 2024-03-14 MED ORDER — HEPARIN (PORCINE) IN NACL 1000-0.9 UT/500ML-% IV SOLN
INTRAVENOUS | Status: DC | PRN
  Administered 2024-03-14: 500 mL

## 2024-03-14 NOTE — Telephone Encounter (Signed)
-----   Message from Nurse Doreatha BROCKS, RN sent at 03/11/2024  4:41 PM EST ----- Regarding: 2/2 PPM implant  Precert:  MD: Kennyth Type of implant: PPM Device manufacturer: [employer] Diagnosis: heart block CPT code: PPM implant, dual - 66791 C-code(s), including quantity (if indicated):  Procedure scheduled (date/time): 2/2 at 1:30pm  Procedure:  Scrub given? Yes  Medication instructions:  Message sent to [facility]? No Added to calendar? Yes Orders entered? Yes Letter complete? Yes Scheduled with cath lab? Yes Labs ordered (CBC, BMET, PT/INR if on warfarin)? Yes Dye allergy? No Pre-meds ordered and instructions given? No, not needed Letter method: given in clinic Special instructions:  H&P: 1/30  Follow-up:  Cassie/Angel, please schedule Routine.  Covering RN:  Please send this message to [employer], EP scheduler, EP Scheduling pool, and EP [employer].

## 2024-03-14 NOTE — Interval H&P Note (Signed)
 History and Physical Interval Note:  03/14/2024 12:51 PM  Brittany Forbes  has presented today for surgery, with the diagnosis of complete heart block.  The various methods of treatment have been discussed with the patient and family. After consideration of risks, benefits and other options for treatment, the patient has consented to  Procedures: PACEMAKER IMPLANT (N/A) as a surgical intervention.  The patient's history has been reviewed, patient examined, no change in status, stable for surgery.  I have reviewed the patient's chart and labs.  Questions were answered to the patient's satisfaction.     Brittany Forbes

## 2024-03-15 ENCOUNTER — Encounter (HOSPITAL_COMMUNITY): Payer: Self-pay | Admitting: Cardiology

## 2024-03-18 ENCOUNTER — Telehealth: Payer: Self-pay | Admitting: Cardiovascular Disease

## 2024-03-18 NOTE — Telephone Encounter (Signed)
 Pt is calling asking if she still needs to have the MR CARD MORPHOLOGY WO/W CM  done since she had to have a pace maker put in. Please advise

## 2024-03-18 NOTE — Telephone Encounter (Signed)
 Cancel the cardiac MRI for now.

## 2024-03-24 ENCOUNTER — Ambulatory Visit

## 2024-03-28 ENCOUNTER — Ambulatory Visit

## 2024-03-30 ENCOUNTER — Other Ambulatory Visit

## 2024-04-25 ENCOUNTER — Ambulatory Visit

## 2024-05-31 ENCOUNTER — Ambulatory Visit: Admitting: Student

## 2024-06-13 ENCOUNTER — Ambulatory Visit: Admitting: Cardiology

## 2024-07-25 ENCOUNTER — Ambulatory Visit

## 2024-10-24 ENCOUNTER — Ambulatory Visit

## 2025-01-23 ENCOUNTER — Ambulatory Visit

## 2025-04-24 ENCOUNTER — Ambulatory Visit
